# Patient Record
Sex: Male | Born: 1955 | Race: White | Hispanic: No | Smoking: Former smoker
Health system: Southern US, Community
[De-identification: ages and names within clinical notes are randomized; demographics above are authoritative.]

## PROBLEM LIST (undated history)

## (undated) DIAGNOSIS — J984 Other disorders of lung: Secondary | ICD-10-CM

## (undated) DIAGNOSIS — Z5111 Encounter for antineoplastic chemotherapy: Secondary | ICD-10-CM

## (undated) DIAGNOSIS — C3490 Malignant neoplasm of unspecified part of unspecified bronchus or lung: Secondary | ICD-10-CM

## (undated) DIAGNOSIS — J189 Pneumonia, unspecified organism: Secondary | ICD-10-CM

## (undated) DIAGNOSIS — K5900 Constipation, unspecified: Secondary | ICD-10-CM

## (undated) DIAGNOSIS — D649 Anemia, unspecified: Secondary | ICD-10-CM

## (undated) DIAGNOSIS — R918 Other nonspecific abnormal finding of lung field: Principal | ICD-10-CM

## (undated) HISTORY — DX: Pneumonia, unspecified organism: J18.9

## (undated) HISTORY — DX: Other nonspecific abnormal finding of lung field: R91.8

## (undated) HISTORY — PX: APPENDECTOMY: SHX54

## (undated) HISTORY — DX: Other disorders of lung: J98.4

## (undated) HISTORY — DX: Malignant neoplasm of unspecified part of unspecified bronchus or lung: C34.90

## (undated) HISTORY — DX: Constipation, unspecified: K59.00

## (undated) HISTORY — DX: Encounter for antineoplastic chemotherapy: Z51.11

---

## 2016-05-08 ENCOUNTER — Ambulatory Visit (HOSPITAL_COMMUNITY)
Admission: RE | Admit: 2016-05-08 | Discharge: 2016-05-08 | Disposition: A | Discharge status: Home / Self Care | Payer: Medicaid Other | Source: Ambulatory Visit | Attending: Family | Admitting: Family

## 2016-05-08 ENCOUNTER — Other Ambulatory Visit (HOSPITAL_COMMUNITY): Payer: Self-pay | Admitting: Family

## 2016-05-08 DIAGNOSIS — R059 Cough, unspecified: Secondary | ICD-10-CM

## 2016-05-08 DIAGNOSIS — R634 Abnormal weight loss: Secondary | ICD-10-CM

## 2016-05-08 DIAGNOSIS — F17200 Nicotine dependence, unspecified, uncomplicated: Secondary | ICD-10-CM

## 2016-05-08 DIAGNOSIS — J398 Other specified diseases of upper respiratory tract: Secondary | ICD-10-CM | POA: Diagnosis not present

## 2016-05-08 DIAGNOSIS — R05 Cough: Secondary | ICD-10-CM

## 2016-05-09 ENCOUNTER — Other Ambulatory Visit (HOSPITAL_COMMUNITY): Payer: Self-pay | Admitting: Family

## 2016-05-09 DIAGNOSIS — J398 Other specified diseases of upper respiratory tract: Secondary | ICD-10-CM

## 2016-05-14 ENCOUNTER — Ambulatory Visit (HOSPITAL_COMMUNITY)
Admission: RE | Admit: 2016-05-14 | Discharge: 2016-05-14 | Disposition: A | Discharge status: Home / Self Care | Payer: Medicaid Other | Source: Ambulatory Visit | Attending: Family | Admitting: Family

## 2016-05-14 DIAGNOSIS — R918 Other nonspecific abnormal finding of lung field: Secondary | ICD-10-CM | POA: Insufficient documentation

## 2016-05-14 DIAGNOSIS — J439 Emphysema, unspecified: Secondary | ICD-10-CM | POA: Diagnosis not present

## 2016-05-14 DIAGNOSIS — R590 Localized enlarged lymph nodes: Secondary | ICD-10-CM | POA: Diagnosis not present

## 2016-05-14 DIAGNOSIS — J398 Other specified diseases of upper respiratory tract: Secondary | ICD-10-CM | POA: Diagnosis not present

## 2016-05-14 MED ORDER — IOPAMIDOL (ISOVUE-300) INJECTION 61%
75.0000 mL | Freq: Once | INTRAVENOUS | Status: AC | PRN
  Administered 2016-05-14: 75 mL via INTRAVENOUS

## 2016-05-19 ENCOUNTER — Telehealth: Payer: Self-pay | Admitting: Internal Medicine

## 2016-05-19 ENCOUNTER — Encounter: Payer: Self-pay | Admitting: Internal Medicine

## 2016-05-19 ENCOUNTER — Ambulatory Visit (HOSPITAL_BASED_OUTPATIENT_CLINIC_OR_DEPARTMENT_OTHER): Payer: Self-pay | Admitting: Internal Medicine

## 2016-05-19 DIAGNOSIS — Z720 Tobacco use: Secondary | ICD-10-CM

## 2016-05-19 DIAGNOSIS — R918 Other nonspecific abnormal finding of lung field: Secondary | ICD-10-CM

## 2016-05-19 HISTORY — DX: Other nonspecific abnormal finding of lung field: R91.8

## 2016-05-19 NOTE — Telephone Encounter (Signed)
Gave patient avs report and appointments for September and appointments for radonc 8/28. Central radiology scheduling will cal re scans - patient aare

## 2016-05-19 NOTE — Progress Notes (Signed)
In addition to my previous note, advised patient to speak with Edward Spence at next visit regarding transportation grant for lung patients which won't affect his [employer].

## 2016-05-19 NOTE — Progress Notes (Signed)
Met with uninsured patient with financial concerns.Patient does not have income but does receive food stamps.  Patient states he has already completed a [facility] FAA at [facility] and just has to drop off some supporting documents to go with the application. Patient wanted to apply for emergency Medicaid. Gave patient a Medicaid Short Form application to complete and return to DSS in his county which is [facility]. Advised that he may speak with them concerning if he qualifies for Emergency assistance. Gave patient an application to the [employer] which assists cancer patients living in [place]. Advised patient to complete and bring back to myself or Lenise to email and that they would contact patient directly notifying of what type of assistance they may offer him. Approved patient for one-time $400 [place]. Patient has a copy of the approval letter as well as the expense sheet it covers along with the Outpatient Pharmacy information. Will forward letter of support to Raquel for drug replacement. Advised patient he automatically receives a 55%discount for being uninsured until Medicaid or [facility] FAA is approved. Patient has my card as well as Lenise's for any additional financial questions or concerns.

## 2016-05-19 NOTE — Progress Notes (Signed)
[facility] Telephone:([phone]   [phone]  CONSULT NOTE  REFERRING PHYSICIAN: House, [name], FNP  REASON FOR CONSULTATION:  60 years old white male with highly suspicious for lung cancer.  HPI Edward Spence is a 60 y.o. male with long history of smoking and no other medical condition. The patient mentions that in May 2007 he started having had some type of cough that was mostly dry and different from his smoking cough. He did not pay much attention back in July 2017 he started having hoarseness of his voice and weight loss of around 25 pounds over the previous 3 months. He was seen by his primary care nurse practitioner. Chest x-ray on 05/08/2016 showed a prominent right paratracheal mass with shift of the trachea to the left. This was followed by CT scan of the chest with contrast on 05/14/2016 and it showed Superior mediastinal mass identified at RIGHT superior mediastinum 5.4 x 7.3 x 5.9 cm in size, exerting mass effect upon great vessels and displacing trachea from RIGHT to LEFT. Adjacent confluent mediastinal adenopathy including 2.3 cm and 2.8 cm RIGHT paratracheal nodes and a 3.4 cm RIGHT suprahilar node. Additional scattered normal size mediastinal nodes are seen an AP window and prevascular. 11 mm short axis subcarinal node. There was also 1.4 x 1.2 cm spiculated right upper lobe nodule. The large mass at the superior mediastinum questionable arising within the mediastinum or within the medial right upper lobe invading the mediastinum. There was also a tiny 3 mm right upper lobe nodule and 4 mm subpleural nodule in the left lower lobe. The patient was referred to me today for further evaluation and recommendation regarding these abnormalities. When seen today he continues to have the hoarseness of his voice as well as cough productive of whitish sputum with no hemoptysis. He denied having any significant chest pain or shortness of breath. He lost around  25 pounds in the last 3 months. He denied having any significant nausea, vomiting, diarrhea or constipation. He has no fever or chills. He has no headache or visual changes. Family history significant for mother died from metastatic male genital cancer and father had COPD and congestive heart failure. The patient is single and has no children. He was accompanied by his friend Edward Spence. 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 He has a history of smoking 1 pack per day for around 40 years and unfortunately he continues to smoke a few cigarettes every day but trying to quit. Has no history of alcohol or drug abuse.   HPI  Past Medical History:  Diagnosis Date  . Lung mass 05/19/2016    Past Surgical History:  Procedure Laterality Date  . APPENDECTOMY      Family History  Problem Relation Age of Onset  . Cancer Mother   . COPD Father   . Heart failure Father     Social History Social History  Substance Use Topics  . Smoking status: Heavy Tobacco Smoker    Packs/day: 1.00    Years: 40.00    Types: Cigarettes  . Smokeless tobacco: Never Used  . Alcohol use No    Allergies  Allergen Reactions  . Penicillins Hives    Had penicillin it at age 51 and " I got trench mouth and hives "    No current outpatient prescriptions on file.   No current facility-administered medications for this visit.  Review of Systems  Constitutional: positive for weight loss Eyes: negative Ears, nose, mouth, throat, and face: positive for hoarseness Respiratory: positive for cough, dyspnea on exertion and sputum Cardiovascular: negative Gastrointestinal: negative Genitourinary:negative Integument/breast: negative Hematologic/lymphatic: negative Musculoskeletal:negative Neurological: negative Behavioral/Psych: negative Endocrine: negative Allergic/Immunologic: negative  Physical Exam  FMB:[id number], healthy, no distress,  well nourished and well developed SKIN: skin color, texture, turgor are normal, no rashes or significant lesions HEAD: Normocephalic, No masses, lesions, tenderness or abnormalities EYES: normal, PERRLA, Conjunctiva are pink and non-injected EARS: External ears normal, Canals clear OROPHARYNX:no exudate, no erythema and lips, buccal mucosa, and tongue normal  NECK: supple, no adenopathy, no JVD LYMPH:  no palpable lymphadenopathy, no hepatosplenomegaly LUNGS: expiratory wheezes bilaterally HEART: regular rate & rhythm, no murmurs and no gallops ABDOMEN:abdomen soft, non-tender, normal bowel sounds and no masses or organomegaly BACK: Back symmetric, no curvature., No CVA tenderness EXTREMITIES:no joint deformities, effusion, or inflammation, no edema, no skin discoloration  NEURO: alert & oriented x 3 with fluent speech, no focal motor/sensory deficits  PERFORMANCE STATUS: ECOG 1  LABORATORY DATA: No results found for: WBC, HGB, HCT, MCV, PLT    Chemistry   No results found for: NA, K, CL, CO2, BUN, CREATININE, GLU No results found for: CALCIUM, ALKPHOS, AST, ALT, BILITOT     RADIOGRAPHIC STUDIES: Dg Chest 2 View  Result Date: 05/08/2016 CLINICAL DATA:  Cough. EXAM: CHEST  2 VIEW COMPARISON:  No recent. FINDINGS: Right paratracheal mass is noted. This could be of pulmonary, pleural, vascular/ mediastinal origin. Mass results in shift of the trachea to the left. Contrast-enhanced chest CT suggested for further evaluation. Mild bilateral from interstitial prominence, most likely related chronic interstitial lung disease. No focal alveolar infiltrate. No pleural effusion or pneumothorax. Old left posterior eighth rib fracture. IMPRESSION: Prominent right paratracheal mass with shift of the trachea to the left. Contrast-enhanced chest CT suggested for further evaluation. These results will be called to the ordering clinician or representative by the Radiologist Assistant, and communication  documented in the PACS or zVision Dashboard. Electronically Signed   By: Marcello Moores  Register   On: 05/08/2016 13:03   Ct Chest W Contrast  Result Date: 05/14/2016 CLINICAL DATA:  RIGHT paratracheal mass, hoarseness, 25 pound weight loss in 3-4 months EXAM: CT CHEST WITH CONTRAST TECHNIQUE: Multidetector CT imaging of the chest was performed during intravenous contrast administration. Sagittal and coronal MPR images reconstructed from axial data set. CONTRAST:  16m ISOVUE-300 IOPAMIDOL (ISOVUE-300) INJECTION 61% IV COMPARISON:  Chest radiograph 05/08/2016 FINDINGS: Cardiovascular: Minimal coronary arterial calcification. Aorta normal caliber. Compression of SVC by mediastinal mass/ adenopathy. Minimal pericardial effusion. Mediastinum/Nodes: Superior mediastinal mass identified at RIGHT superior mediastinum 5.4 x 7.3 x 5.9 cm in size, exerting mass effect upon great vessels and displacing trachea from RIGHT to LEFT. Adjacent confluent mediastinal adenopathy including 2.3 cm and 2.8 cm RIGHT paratracheal nodes and a 3.4 cm RIGHT suprahilar node. Additional scattered normal size mediastinal nodes are seen an AP window and prevascular. 11 mm short axis subcarinal node image 69. Esophagus unremarkable. Lungs/Pleura: 14 x 12 mm spiculated RIGHT upper lobe nodule image 28. Significant central peribronchial thickening RIGHT upper lobe. Large mass at superior mediastinum question arising within mediastinum or within medial RIGHT upper lobe invading mediastinum. Anterior RIGHT upper lobe infiltrate beyond the mass. Underlying emphysematous changes. Tiny 3 mm RIGHT upper lobe nodule image 44. 3 mm RIGHT lower lobe nodule image 91. 4 mm subpleural nodule LEFT lower lobe image 82. No additional infiltrate, pleural  effusion, or pneumothorax. No endobronchial lesions identified. Upper Abdomen: Visualized upper abdomen unremarkable Musculoskeletal: Old healed fractures LEFT fifth and eighth ribs. No bone destruction. IMPRESSION:  Spiculated RIGHT upper lobe masses 14 x 12 mm question neoplasm. Large medial RIGHT upper lobe/superior mediastinal mass 5.4 x 7.3 x 5.9 cm question RIGHT upper lobe neoplasm invading mediastinum versus mediastinal mass/adenopathy, exerting mass effect upon trachea and great vessels. Additional enlarged confluent RIGHT mediastinal and RIGHT suprahilar lymph nodes. Tissue diagnosis recommended. Underlying emphysematous changes with peribronchial thickening in postobstructive infiltrate versus tumor in the RIGHT upper lobe. Three tiny nonspecific RIGHT lung nodules. These results will be called to the ordering clinician or representative by the Radiologist Assistant, and communication documented in the PACS or zVision Dashboard. Electronically Signed   By: Lavonia Dana M.D.   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   PLAN: I had a lengthy discussion with the patient and his friend today about his current disease status and further investigation to confirm a diagnosis. I recommended for the patient to have a PET scan as well as MRI of the brain to complete the staging workup and to rule out brain metastasis. I also referred the patient to Dr. Roxan Hockey for consideration of bronchoscopy plus/minus mediastinoscopy for tissue diagnosis. The patient is at risk for vascular and airway compromise from the large mediastinal mass. I recommended for him to see radiation oncology for consideration of palliative radiotherapy to this area. I will see the patient back for follow-up visit in less than 2 weeks for reevaluation and more detailed discussion of his treatment  options based on the final staging workup and tissue diagnosis. He was advised to call immediately if he has any concerning symptoms in the interval. The patient voices understanding of current disease status and treatment options and is in agreement with the current care plan.  All questions were answered. The patient knows to call the clinic with any problems, questions or concerns. We can certainly see the patient much sooner if necessary.  Thank you so much for allowing me to participate in the care of Edward Spence. I will continue to follow up the patient with you and assist in his care.  I spent 40 minutes counseling the patient face to face. The total time spent in the appointment was 60 minutes.  Disclaimer: This note was dictated with voice recognition software. Similar sounding words can inadvertently be transcribed and may not be corrected upon review.   Annina Piotrowski K. May 19, 2016, 4:21 PM

## 2016-05-20 ENCOUNTER — Encounter: Payer: Self-pay | Admitting: Internal Medicine

## 2016-05-20 NOTE — Progress Notes (Signed)
Recd income info to file just in case needed for drug replacement. See prv notes. No treatment as of today.

## 2016-05-20 NOTE — Progress Notes (Signed)
No treatment as of today

## 2016-05-21 ENCOUNTER — Other Ambulatory Visit: Payer: Self-pay | Admitting: *Deleted

## 2016-05-21 ENCOUNTER — Encounter: Payer: Self-pay | Admitting: Thoracic Surgery (Cardiothoracic Vascular Surgery)

## 2016-05-21 ENCOUNTER — Institutional Professional Consult (permissible substitution) (INDEPENDENT_AMBULATORY_CARE_PROVIDER_SITE_OTHER): Payer: Medicaid Other | Admitting: Thoracic Surgery (Cardiothoracic Vascular Surgery)

## 2016-05-21 ENCOUNTER — Encounter (HOSPITAL_COMMUNITY): Payer: Self-pay | Admitting: *Deleted

## 2016-05-21 VITALS — BP 117/61 | HR 86 | Resp 20 | Ht 74.0 in | Wt 196.0 lb

## 2016-05-21 DIAGNOSIS — J9859 Other diseases of mediastinum, not elsewhere classified: Secondary | ICD-10-CM

## 2016-05-21 DIAGNOSIS — R918 Other nonspecific abnormal finding of lung field: Secondary | ICD-10-CM

## 2016-05-21 NOTE — OB Triage Note (Signed)
Spoke with pt and pt's ex wife for pre-op call. Pt denies any cardiac history, chest pain or sob.

## 2016-05-21 NOTE — Progress Notes (Addendum)
Thoracic Location of Tumor / Histology: RIGHT Paratracheal mass, Superior mediastinal mass identified and Right superior mediastinum  Displacing trachea from right to left, RUL lung nodule   Patient presented  months ago with symptoms of: hoarseness voice , dry cough and loss 25-30  lbs over 3 months   Biopsies of  (if applicable) revealed: Pending Bronchoscopy for biopsy  with Dr. 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 Rosalene Billings seen patient 05/19/16 scheduling Pet scan and MRI of brain, 05/29/16;    referral for palliative radiation, follow up  06/05/2016  Signs/Symptoms  Weight changes, if any: 25 lb-30 lb s weight loss  Respiratory complaints, if any:  No shortness breath stated, just started coughing up white phlegm this last week   Hemoptysis, if any: NO  Pain issues, if any:  No, just hoarseness  SAFETY ISSUES:  Prior radiation? NO  Pacemaker/ICD? NO  Is the patient on methotrexate? NO  Current Complaints / other details:  Single  4 children,   Mother deceased metastatic genital cancer, Father COPD and CHF  BP 105/78 (BP Location: Left Arm, Patient Position: Sitting, Cuff Size: Normal)   Pulse 91   Temp 97.7 F (36.5 C) (Oral)   Resp 20   Ht '6\' 2"'$  (1.88 m)   Wt 193 lb 11.2 oz (87.9 kg)   SpO2 100% Comment: room air  BMI 24.87 kg/m   Wt Readings from Last 3 Encounters:  05/26/16 193 lb 11.2 oz (87.9 kg)  05/22/16 196 lb (88.9 kg)  05/21/16 196 lb (88.9 kg)   Allergies:PCNS=trench mouth and hives

## 2016-05-21 NOTE — Progress Notes (Signed)
PCP is House, [name], FNP Referring Provider is Curt Bears, MD  Chief Complaint  Patient presents with  . Lung Mass    Surgical eval, needs tissue diagnosis, Chest CT 05/14/16,     HPI: 60 year old man who presented with a chief complaint of hoarseness and was found to have a right lung mass with mediastinal adenopathy.  Edward Spence is a 60 year old man with a 40+-pack-year history of smoking who was in his usual state of health until May when he developed a dry cough. This went on for a couple of months. Then in early July he noted hoarseness. Around this time his cough became productive of mucus. He denies any hemoptysis. He also has lost 14 pounds over the past 3 months. He also noted that he was getting more short of breath with exertion. A chest x-ray showed right hilar and mediastinal fullness. A CT of the chest was done which showed a right hilar mass with mediastinal adenopathy. There was mass effect on the trachea and great vessels.  Edward Spence Score: At the time of surgery this patient's most appropriate activity status/level should be described as: '[]'$     0    Normal activity, no symptoms '[x]'$     1    Restricted in physical strenuous activity but ambulatory, able to do out light work '[]'$     2    Ambulatory and capable of self care, unable to do work activities, up and about >50 % of waking hours                              '[]'$     3    Only limited self care, in bed greater than 50% of waking hours '[]'$     4    Completely disabled, no self care, confined to bed or chair '[]'$     5    Moribund    Past Medical History:  Diagnosis Date  . Lung mass 05/19/2016    Past Surgical History:  Procedure Laterality Date  . APPENDECTOMY      Family History  Problem Relation Age of Onset  . Cancer Mother   . COPD Father   . Heart failure Father     Social History Social History  Substance Use Topics  . Smoking status: Heavy Tobacco Smoker    Packs/day: 1.00    Years: 40.00   Types: Cigarettes  . Smokeless tobacco: Never Used  . Alcohol use No    No current outpatient prescriptions on file.   No current facility-administered medications for this visit.     Allergies  Allergen Reactions  . Penicillins Hives    Had penicillin it at age 10 and " I got trench mouth and hives "    Review of Systems  Constitutional: Positive for activity change, fatigue and unexpected weight change (has lost 14 pounds in 3 months). Negative for chills and fever.  HENT: Positive for voice change. Negative for trouble swallowing.   Eyes: Negative for visual disturbance.  Respiratory: Positive for cough and shortness of breath.   Cardiovascular: Negative for chest pain and leg swelling.  Gastrointestinal: Negative for abdominal pain and blood in stool.  Genitourinary: Negative for difficulty urinating and dysuria.  Musculoskeletal: Negative.   Neurological: Negative for dizziness, syncope and headaches.  Hematological: Negative for adenopathy. Does not bruise/bleed easily.  All other systems reviewed and are negative.   BP 117/61 (BP Location: Left Arm, Patient Position:  Sitting, Cuff Size: Normal)   Pulse 86   Resp 20   Ht '6\' 2"'$  (1.88 m)   Wt 196 lb (88.9 kg)   SpO2 98% Comment: RA  BMI 25.16 kg/m  Physical Exam  Constitutional: He is oriented to person, place, and time. He appears well-developed and well-nourished. No distress.  HENT:  Head: Normocephalic and atraumatic.  Mouth/Throat: No oropharyngeal exudate.  hoarse  Eyes: Conjunctivae and EOM are normal. Pupils are equal, round, and reactive to light. No scleral icterus.  Neck: Neck supple. No thyromegaly present.  Cardiovascular: Normal rate, regular rhythm, normal heart sounds and intact distal pulses.   No murmur heard. Pulmonary/Chest: Effort normal and breath sounds normal. No respiratory distress. He has no wheezes.  Abdominal: Soft. Bowel sounds are normal. He exhibits no distension. There is no  tenderness. There is no rebound.  Musculoskeletal: Normal range of motion. He exhibits no edema or deformity.  Lymphadenopathy:    He has no cervical adenopathy.  Neurological: He is alert and oriented to person, place, and time. He exhibits normal muscle tone.  Skin: Skin is warm and dry.  Psychiatric: He has a normal mood and affect.  Vitals reviewed.    Diagnostic Tests: CT CHEST WITH CONTRAST  TECHNIQUE: Multidetector CT imaging of the chest was performed during intravenous contrast administration. Sagittal and coronal MPR images reconstructed from axial data set.  CONTRAST:  60m ISOVUE-300 IOPAMIDOL (ISOVUE-300) INJECTION 61% IV  COMPARISON:  Chest radiograph 05/08/2016  FINDINGS: Cardiovascular: Minimal coronary arterial calcification. Aorta normal caliber. Compression of SVC by mediastinal mass/ adenopathy. Minimal pericardial effusion.  Mediastinum/Nodes: Superior mediastinal mass identified at RIGHT superior mediastinum 5.4 x 7.3 x 5.9 cm in size, exerting mass effect upon great vessels and displacing trachea from RIGHT to LEFT. Adjacent confluent mediastinal adenopathy including 2.3 cm and 2.8 cm RIGHT paratracheal nodes and a 3.4 cm RIGHT suprahilar node. Additional scattered normal size mediastinal nodes are seen an AP window and prevascular. 11 mm short axis subcarinal node image 69. Esophagus unremarkable.  Lungs/Pleura: 14 x 12 mm spiculated RIGHT upper lobe nodule image 28. Significant central peribronchial thickening RIGHT upper lobe. Large mass at superior mediastinum question arising within mediastinum or within medial RIGHT upper lobe invading mediastinum. Anterior RIGHT upper lobe infiltrate beyond the mass. Underlying emphysematous changes. Tiny 3 mm RIGHT upper lobe nodule image 44. 3 mm RIGHT lower lobe nodule image 91. 4 mm subpleural nodule LEFT lower lobe image 82. No additional infiltrate, pleural effusion, or pneumothorax. No  endobronchial lesions identified.  Upper Abdomen: Visualized upper abdomen unremarkable  Musculoskeletal: Old healed fractures LEFT fifth and eighth ribs. No bone destruction.  IMPRESSION: Spiculated RIGHT upper lobe masses 14 x 12 mm question neoplasm.  Large medial RIGHT upper lobe/superior mediastinal mass 5.4 x 7.3 x 5.9 cm question RIGHT upper lobe neoplasm invading mediastinum versus mediastinal mass/adenopathy, exerting mass effect upon trachea and great vessels.  Additional enlarged confluent RIGHT mediastinal and RIGHT suprahilar lymph nodes.  Tissue diagnosis recommended.  Underlying emphysematous changes with peribronchial thickening in postobstructive infiltrate versus tumor in the RIGHT upper lobe.  Three tiny nonspecific RIGHT lung nodules.  These results will be called to the ordering clinician or representative by the Radiologist Assistant, and communication documented in the PACS or zVision Dashboard.   Electronically Signed   By: MLavonia DanaM.D.   On: 05/14/2016 16:19  Personally reviewed the CT images and concur with findings as noted above.  Impression: 60year old man with a history of  tobacco abuse who presented with cough and hoarseness progressing to shortness of breath with exertion who has been found to have a large right hilar mass with mediastinal adenopathy. This is highly suspicious for primary bronchogenic carcinoma. Differential includes both small cell and non-small cell carcinoma. Small cell is most likely.  He needs a tissue diagnosis to guide therapy.  I advised him to undergo bronchoscopy, endobronchial ultrasound, and possible mediastinoscopy for diagnostic purposes. I informed him of the general nature of the procedure including the need for general anesthesia and possible need for an incision. This will be done as an outpatient. I informed him of the indications, risks, benefits, and alternatives. He understands risks  include, but are not limited to those associated with general anesthesia including death, MI, DVT, PE, stroke, bleeding, wound infection, pneumothorax, failure to make a diagnosis, as well as possibility of other unforeseeable complications.  He accepts the risks and wishes to proceed.  Plan: Bronchoscopy, endobronchial ultrasound, possible mediastinal endoscopy on Thursday, 05/22/2016.  [name], MD [facility] [phone]

## 2016-05-22 ENCOUNTER — Encounter (HOSPITAL_COMMUNITY)
Admission: RE | Disposition: A | Discharge status: Home / Self Care | Payer: Self-pay | Source: Ambulatory Visit | Attending: Thoracic Surgery (Cardiothoracic Vascular Surgery)

## 2016-05-22 ENCOUNTER — Ambulatory Visit (HOSPITAL_COMMUNITY): Payer: Medicaid Other

## 2016-05-22 ENCOUNTER — Ambulatory Visit (HOSPITAL_COMMUNITY): Payer: Medicaid Other | Admitting: Anesthesiology

## 2016-05-22 ENCOUNTER — Ambulatory Visit (HOSPITAL_COMMUNITY)
Admission: RE | Admit: 2016-05-22 | Discharge: 2016-05-22 | Disposition: A | Discharge status: Home / Self Care | Payer: Medicaid Other | Source: Ambulatory Visit | Attending: Thoracic Surgery (Cardiothoracic Vascular Surgery) | Admitting: Thoracic Surgery (Cardiothoracic Vascular Surgery)

## 2016-05-22 ENCOUNTER — Encounter (HOSPITAL_COMMUNITY): Payer: Self-pay | Admitting: Certified Registered Nurse Anesthetist

## 2016-05-22 DIAGNOSIS — R918 Other nonspecific abnormal finding of lung field: Secondary | ICD-10-CM

## 2016-05-22 DIAGNOSIS — F17210 Nicotine dependence, cigarettes, uncomplicated: Secondary | ICD-10-CM | POA: Insufficient documentation

## 2016-05-22 DIAGNOSIS — R590 Localized enlarged lymph nodes: Secondary | ICD-10-CM | POA: Insufficient documentation

## 2016-05-22 DIAGNOSIS — C3401 Malignant neoplasm of right main bronchus: Secondary | ICD-10-CM | POA: Diagnosis not present

## 2016-05-22 DIAGNOSIS — J9859 Other diseases of mediastinum, not elsewhere classified: Secondary | ICD-10-CM

## 2016-05-22 HISTORY — DX: Pneumonia, unspecified organism: J18.9

## 2016-05-22 HISTORY — PX: VIDEO BRONCHOSCOPY WITH ENDOBRONCHIAL ULTRASOUND: SHX6177

## 2016-05-22 LAB — COMPREHENSIVE METABOLIC PANEL
ALBUMIN: 3.6 g/dL (ref 3.5–5.0)
ALT: 32 U/L (ref 17–63)
AST: 48 U/L — AB (ref 15–41)
Alkaline Phosphatase: 144 U/L — ABNORMAL HIGH (ref 38–126)
Anion gap: 7 (ref 5–15)
BILIRUBIN TOTAL: 1.1 mg/dL (ref 0.3–1.2)
BUN: 5 mg/dL — AB (ref 6–20)
CO2: 23 mmol/L (ref 22–32)
CREATININE: 0.77 mg/dL (ref 0.61–1.24)
Calcium: 9.2 mg/dL (ref 8.9–10.3)
Chloride: 107 mmol/L (ref 101–111)
GFR calc Af Amer: >60 mL/min (ref >60)
GLUCOSE: 96 mg/dL (ref 65–99)
POTASSIUM: 4.4 mmol/L (ref 3.5–5.1)
Sodium: 137 mmol/L (ref 135–145)
TOTAL PROTEIN: 7 g/dL (ref 6.5–8.1)

## 2016-05-22 LAB — APTT: APTT: 33 s (ref 24–36)

## 2016-05-22 LAB — PROTIME-INR
INR: 1.14
PROTHROMBIN TIME: 14.6 s (ref 11.4–15.2)

## 2016-05-22 LAB — CBC
HEMATOCRIT: 43.5 % (ref 39.0–52.0)
HEMOGLOBIN: 13.9 g/dL (ref 13.0–17.0)
MCH: 27.3 pg (ref 26.0–34.0)
MCHC: 32 g/dL (ref 30.0–36.0)
MCV: 85.3 fL (ref 78.0–100.0)
Platelets: 186 10*3/uL (ref 150–400)
RBC: 5.1 MIL/uL (ref 4.22–5.81)
RDW: 15.8 % — ABNORMAL HIGH (ref 11.5–15.5)
WBC: 4.7 10*3/uL (ref 4.0–10.5)

## 2016-05-22 LAB — ABO/RH: ABO/RH(D): A POS

## 2016-05-22 LAB — TYPE AND SCREEN
ABO/RH(D): A POS
ANTIBODY SCREEN: NEGATIVE

## 2016-05-22 LAB — SURGICAL PCR SCREEN
MRSA, PCR: NEGATIVE
STAPHYLOCOCCUS AUREUS: NEGATIVE

## 2016-05-22 SURGERY — BRONCHOSCOPY, WITH EBUS
Anesthesia: General

## 2016-05-22 MED ORDER — DEXAMETHASONE SODIUM PHOSPHATE 10 MG/ML IJ SOLN
INTRAMUSCULAR | Status: AC
  Filled 2016-05-22: qty 1

## 2016-05-22 MED ORDER — MIDAZOLAM HCL 5 MG/5ML IJ SOLN
INTRAMUSCULAR | Status: DC | PRN
  Administered 2016-05-22: 2 mg via INTRAVENOUS

## 2016-05-22 MED ORDER — ONDANSETRON HCL 4 MG/2ML IJ SOLN: 4.0000 mg | Freq: Four times a day (QID) | INTRAMUSCULAR | Status: DC | PRN

## 2016-05-22 MED ORDER — SUGAMMADEX SODIUM 200 MG/2ML IV SOLN
INTRAVENOUS | Status: DC | PRN
  Administered 2016-05-22: 150 mg via INTRAVENOUS

## 2016-05-22 MED ORDER — PROPOFOL 10 MG/ML IV BOLUS
INTRAVENOUS | Status: DC | PRN
  Administered 2016-05-22: 150 mg via INTRAVENOUS

## 2016-05-22 MED ORDER — FENTANYL CITRATE (PF) 100 MCG/2ML IJ SOLN
INTRAMUSCULAR | Status: DC | PRN
  Administered 2016-05-22: 100 ug via INTRAVENOUS

## 2016-05-22 MED ORDER — HYDROMORPHONE HCL 1 MG/ML IJ SOLN: 0.2500 mg | INTRAMUSCULAR | Status: DC | PRN

## 2016-05-22 MED ORDER — ROCURONIUM BROMIDE 10 MG/ML (PF) SYRINGE
PREFILLED_SYRINGE | INTRAVENOUS | Status: DC | PRN
  Administered 2016-05-22: 20 mg via INTRAVENOUS
  Administered 2016-05-22: 40 mg via INTRAVENOUS

## 2016-05-22 MED ORDER — MUPIROCIN 2 % EX OINT
TOPICAL_OINTMENT | Freq: Once | CUTANEOUS | Status: AC
  Administered 2016-05-22: 07:00:00 via NASAL

## 2016-05-22 MED ORDER — FENTANYL CITRATE (PF) 100 MCG/2ML IJ SOLN
INTRAMUSCULAR | Status: AC
  Filled 2016-05-22: qty 2

## 2016-05-22 MED ORDER — OXYCODONE HCL 5 MG PO TABS: 5.0000 mg | ORAL_TABLET | Freq: Once | ORAL | Status: DC | PRN

## 2016-05-22 MED ORDER — MUPIROCIN 2 % EX OINT
TOPICAL_OINTMENT | CUTANEOUS | Status: AC
  Filled 2016-05-22: qty 22

## 2016-05-22 MED ORDER — SUGAMMADEX SODIUM 200 MG/2ML IV SOLN
INTRAVENOUS | Status: AC
  Filled 2016-05-22: qty 2

## 2016-05-22 MED ORDER — LIDOCAINE 2% (20 MG/ML) 5 ML SYRINGE
INTRAMUSCULAR | Status: DC | PRN
  Administered 2016-05-22: 80 mg via INTRAVENOUS

## 2016-05-22 MED ORDER — EPINEPHRINE HCL 1 MG/ML IJ SOLN
INTRAMUSCULAR | Status: AC
  Filled 2016-05-22: qty 1

## 2016-05-22 MED ORDER — ONDANSETRON HCL 4 MG/2ML IJ SOLN
INTRAMUSCULAR | Status: AC
  Filled 2016-05-22: qty 2

## 2016-05-22 MED ORDER — PROPOFOL 10 MG/ML IV BOLUS
INTRAVENOUS | Status: AC
  Filled 2016-05-22: qty 20

## 2016-05-22 MED ORDER — VANCOMYCIN HCL IN DEXTROSE 1-5 GM/200ML-% IV SOLN
1000.0000 mg | INTRAVENOUS | Status: AC
  Administered 2016-05-22: 1000 mg via INTRAVENOUS

## 2016-05-22 MED ORDER — PROPOFOL 10 MG/ML IV BOLUS
INTRAVENOUS | Status: AC
  Filled 2016-05-22: qty 40

## 2016-05-22 MED ORDER — LIDOCAINE 2% (20 MG/ML) 5 ML SYRINGE
INTRAMUSCULAR | Status: AC
  Filled 2016-05-22: qty 5

## 2016-05-22 MED ORDER — VANCOMYCIN HCL IN DEXTROSE 1-5 GM/200ML-% IV SOLN
INTRAVENOUS | Status: AC
  Filled 2016-05-22: qty 200

## 2016-05-22 MED ORDER — OXYCODONE HCL 5 MG/5ML PO SOLN: 5.0000 mg | Freq: Once | ORAL | Status: DC | PRN

## 2016-05-22 MED ORDER — LACTATED RINGERS IV SOLN
INTRAVENOUS | Status: DC | PRN
  Administered 2016-05-22: 07:00:00 via INTRAVENOUS

## 2016-05-22 MED ORDER — 0.9 % SODIUM CHLORIDE (POUR BTL) OPTIME
TOPICAL | Status: DC | PRN
  Administered 2016-05-22: 1000 mL

## 2016-05-22 MED ORDER — ROCURONIUM BROMIDE 10 MG/ML (PF) SYRINGE
PREFILLED_SYRINGE | INTRAVENOUS | Status: AC
  Filled 2016-05-22: qty 10

## 2016-05-22 MED ORDER — ONDANSETRON HCL 4 MG/2ML IJ SOLN
INTRAMUSCULAR | Status: DC | PRN
  Administered 2016-05-22: 4 mg via INTRAVENOUS

## 2016-05-22 MED ORDER — MIDAZOLAM HCL 2 MG/2ML IJ SOLN
INTRAMUSCULAR | Status: AC
  Filled 2016-05-22: qty 2

## 2016-05-22 MED ORDER — DEXAMETHASONE SODIUM PHOSPHATE 10 MG/ML IJ SOLN
INTRAMUSCULAR | Status: DC | PRN
  Administered 2016-05-22: 4 mg via INTRAVENOUS

## 2016-05-22 SURGICAL SUPPLY — 62 items
APPLIER CLIP LOGIC TI 5 (MISCELLANEOUS) IMPLANT
BLADE SURG 15 STRL LF DISP TIS (BLADE) IMPLANT
BLADE SURG 15 STRL SS (BLADE)
BRUSH CYTOL CELLEBRITY 1.5X140 (MISCELLANEOUS) ×3 IMPLANT
CANISTER SUCTION 2500CC (MISCELLANEOUS) ×3 IMPLANT
CLIP TI MEDIUM 6 (CLIP) IMPLANT
CONT SPEC 4OZ CLIKSEAL STRL BL (MISCELLANEOUS) ×6 IMPLANT
COTTONBALL LRG STERILE PKG (GAUZE/BANDAGES/DRESSINGS) IMPLANT
COVER DOME SNAP 22 D (MISCELLANEOUS) ×3 IMPLANT
COVER SURGICAL LIGHT HANDLE (MISCELLANEOUS) ×3 IMPLANT
COVER TABLE BACK 60X90 (DRAPES) ×3 IMPLANT
DERMABOND ADVANCED (GAUZE/BANDAGES/DRESSINGS)
DERMABOND ADVANCED .7 DNX12 (GAUZE/BANDAGES/DRESSINGS) IMPLANT
DRAPE CHEST BREAST 15X10 FENES (DRAPES) IMPLANT
ELECT REM PT RETURN 9FT ADLT (ELECTROSURGICAL) ×3
ELECTRODE REM PT RTRN 9FT ADLT (ELECTROSURGICAL) ×1 IMPLANT
FILTER STRAW FLUID ASPIR (MISCELLANEOUS) IMPLANT
FORCEPS BIOP RJ4 1.8 (CUTTING FORCEPS) IMPLANT
GAUZE SPONGE 4X4 12PLY STRL (GAUZE/BANDAGES/DRESSINGS) ×3 IMPLANT
GAUZE SPONGE 4X4 16PLY XRAY LF (GAUZE/BANDAGES/DRESSINGS) IMPLANT
GLOVE SURG SIGNA 7.5 PF LTX (GLOVE) ×3 IMPLANT
GOWN STRL REUS W/ TWL LRG LVL3 (GOWN DISPOSABLE) ×1 IMPLANT
GOWN STRL REUS W/ TWL XL LVL3 (GOWN DISPOSABLE) ×1 IMPLANT
GOWN STRL REUS W/TWL LRG LVL3 (GOWN DISPOSABLE) ×2
GOWN STRL REUS W/TWL XL LVL3 (GOWN DISPOSABLE) ×2
HEMOSTAT SURGICEL 2X14 (HEMOSTASIS) IMPLANT
KIT BASIN OR (CUSTOM PROCEDURE TRAY) ×3 IMPLANT
KIT CLEAN ENDO COMPLIANCE (KITS) ×9 IMPLANT
KIT ROOM TURNOVER OR (KITS) ×3 IMPLANT
MARKER SKIN DUAL TIP RULER LAB (MISCELLANEOUS) ×3 IMPLANT
NEEDLE 22X1 1/2 (OR ONLY) (NEEDLE) IMPLANT
NEEDLE BIOPSY TRANSBRONCH 21G (NEEDLE) IMPLANT
NEEDLE BLUNT 18X1 FOR OR ONLY (NEEDLE) IMPLANT
NEEDLE EBUS SONO TIP PENTAX (NEEDLE) ×3 IMPLANT
NS IRRIG 1000ML POUR BTL (IV SOLUTION) ×3 IMPLANT
OIL SILICONE PENTAX (PARTS (SERVICE/REPAIRS)) ×3 IMPLANT
PACK SURGICAL SETUP 50X90 (CUSTOM PROCEDURE TRAY) IMPLANT
PAD ARMBOARD 7.5X6 YLW CONV (MISCELLANEOUS) ×6 IMPLANT
PENCIL BUTTON HOLSTER BLD 10FT (ELECTRODE) IMPLANT
SPONGE INTESTINAL PEANUT (DISPOSABLE) IMPLANT
SUT SILK 2 0 (SUTURE)
SUT SILK 2-0 18XBRD TIE 12 (SUTURE) IMPLANT
SUT VIC AB 2-0 CT1 27 (SUTURE)
SUT VIC AB 2-0 CT1 TAPERPNT 27 (SUTURE) IMPLANT
SUT VIC AB 3-0 SH 18 (SUTURE) IMPLANT
SUT VICRYL 4-0 PS2 18IN ABS (SUTURE) IMPLANT
SWAB COLLECTION DEVICE MRSA (MISCELLANEOUS) IMPLANT
SYR 20CC LL (SYRINGE) ×3 IMPLANT
SYR 20ML ECCENTRIC (SYRINGE) ×3 IMPLANT
SYR 5ML LL (SYRINGE) ×3 IMPLANT
SYR 5ML LUER SLIP (SYRINGE) ×3 IMPLANT
SYR CONTROL 10ML LL (SYRINGE) IMPLANT
SYRINGE 10CC LL (SYRINGE) IMPLANT
TOWEL OR 17X24 6PK STRL BLUE (TOWEL DISPOSABLE) ×3 IMPLANT
TOWEL OR 17X26 10 PK STRL BLUE (TOWEL DISPOSABLE) IMPLANT
TRAP SPECIMEN MUCOUS 40CC (MISCELLANEOUS) ×3 IMPLANT
TUBE ANAEROBIC SPECIMEN COL (MISCELLANEOUS) IMPLANT
TUBE CONNECTING 12'X1/4 (SUCTIONS)
TUBE CONNECTING 12X1/4 (SUCTIONS) IMPLANT
TUBE CONNECTING 20'X1/4 (TUBING) ×1
TUBE CONNECTING 20X1/4 (TUBING) ×2 IMPLANT
WATER STERILE IRR 1000ML POUR (IV SOLUTION) ×3 IMPLANT

## 2016-05-22 NOTE — Op Note (Signed)
NAME:  JHALIL, SILVERA NO.:  [id number]  MEDICAL RECORD NO.:  [id number]  LOCATION:  [facility]                         FACILITY:  [facility]  PHYSICIAN:  Revonda Standard. Roxan Hockey, M.D.DATE OF BIRTH:  [birth year]  DATE OF PROCEDURE:  05/22/2016 DATE OF DISCHARGE:  05/22/2016                              OPERATIVE REPORT   PREOPERATIVE DIAGNOSIS:  Right hilar mass with mediastinal adenopathy.  POSTOPERATIVE DIAGNOSIS:  Non-small cell carcinoma, clinical stage IIIB.  PROCEDURE:   Videobronchoscopy with brushings Endobronchial ultrasound with mediastinal lymph node aspirations.  SURGEON:  Revonda Standard. Roxan Hockey, M.D.  ASSISTANT:  None.  ANESTHESIA:  General.  FINDINGS:  Large right paratracheal mass.  Initial aspiration showed non- small cell carcinoma.  CLINICAL NOTE:  Mr. Leece is a 60 year old man with a history of tobacco abuse who recently developed cough, shortness of breath and weight loss.  Workup revealed a large right hilar mass with mediastinal adenopathy.  He was referred for surgical consultation for biopsy.  The indications, risks, benefits and alternatives were discussed in detail with the patient.  He understood and accepted the risks and agreed to proceed.  OPERATIVE NOTE:  Mr. Holan was brought to the operating room on May 22, 2016.  He had induction of general anesthesia and was intubated.  After performing a time-out, flexible fiberoptic bronchoscopy was performed via the endotracheal tube.  It revealed normal endobronchial anatomy with no endobronchial lesions seen to the level of the subsegmental bronchi.  The bronchoscope was removed.  The endobronchial ultrasound probe was advanced.  There was a large right paratracheal mass, it was unclear if this was the primary mass or paratracheal adenopathy.  Multiple aspirations were performed including some with and some without suction applied.  With each aspiration, part of the specimen was  placed onto the slides and the remainder was placed into the cytologic preparation fluid for cell block.  Four aspirations were performed and these specimens were sent to Pathology.  Two additional aspirations were performed and added to the cell block specimen.  There was some bleeding within the airway with aspirations. The endobronchial ultrasound probe was removed.  The bronchoscope was reinserted.  The bloody aspirations were cleared.  There was blood noted coming from the apical segmental bronchus.  Brushings were performed in that area.  All the additional bleeding cleared with saline irrigation. The initial slide came back showing non-small cell carcinoma.  The endobronchial ultrasound probe was reinserted and three additional aspirations were performed with the entire specimen being placed into the cytologic fluid for cell block.  A final inspection was made, there was no ongoing bleeding.  The scope was removed.  The patient was extubated in the operating room and taken to the postanesthetic care unit in good condition.     Revonda Standard Roxan Hockey, M.D.     SCH/MEDQ  D:  05/22/2016  T:  05/22/2016  Job:  [id number]

## 2016-05-22 NOTE — Anesthesia Procedure Notes (Addendum)
Procedure Name: Intubation Date/Time: 05/22/2016 8:04 AM Performed by: Merdis Delay Pre-anesthesia Checklist: Patient identified, Suction available, Patient being monitored, Timeout performed and Emergency Drugs available Patient Re-evaluated:Patient Re-evaluated prior to inductionOxygen Delivery Method: Circle system utilized Preoxygenation: Pre-oxygenation with 100% oxygen Intubation Type: IV induction Ventilation: Mask ventilation without difficulty Laryngoscope Size: Mac and 4 Grade View: Grade I Tube type: Oral Tube size: 9.0 mm Number of attempts: 1 Placement Confirmation: ETT inserted through vocal cords under direct vision,  positive ETCO2,  CO2 detector and breath sounds checked- equal and bilateral Secured at: 22 cm Tube secured with: Tape Dental Injury: Teeth and Oropharynx as per pre-operative assessment  Comments: Preformed by daniel huff srna

## 2016-05-22 NOTE — Transfer of Care (Signed)
Immediate Anesthesia Transfer of Care Note  Patient: Edward Spence  Procedure(s) Performed: Procedure(s): VIDEO BRONCHOSCOPY WITH ENDOBRONCHIAL ULTRASOUND (N/A)  Patient Location: PACU  Anesthesia Type:General  Level of Consciousness: awake, alert  and oriented  Airway & Oxygen Therapy: Patient Spontanous Breathing and Patient connected to nasal cannula oxygen  Post-op Assessment: Report given to RN and Post -op Vital signs reviewed and stable  Post vital signs: Reviewed and stable  Last Vitals:  Vitals:   05/22/16 0618  BP: 124/61  Pulse: 79  Resp: 20  Temp: 36.8 C    Last Pain:  Vitals:   05/22/16 0618  TempSrc: Oral         Complications: No apparent anesthesia complications

## 2016-05-22 NOTE — H&P (View-Only) (Signed)
PCP is House, [name], FNP Referring Provider is Curt Bears, MD  Chief Complaint  Patient presents with  . Lung Mass    Surgical eval, needs tissue diagnosis, Chest CT 05/14/16,     HPI: 60 year old man who presented with a chief complaint of hoarseness and was found to have a right lung mass with mediastinal adenopathy.  Edward Spence is a 60 year old man with a 40+-pack-year history of smoking who was in his usual state of health until May when he developed a dry cough. This went on for a couple of months. Then in early July he noted hoarseness. Around this time his cough became productive of mucus. He denies any hemoptysis. He also has lost 14 pounds over the past 3 months. He also noted that he was getting more short of breath with exertion. A chest x-ray showed right hilar and mediastinal fullness. A CT of the chest was done which showed a right hilar mass with mediastinal adenopathy. There was mass effect on the trachea and great vessels.  Zubrod Score: At the time of surgery this patient's most appropriate activity status/level should be described as: '[]'$     0    Normal activity, no symptoms '[x]'$     1    Restricted in physical strenuous activity but ambulatory, able to do out light work '[]'$     2    Ambulatory and capable of self care, unable to do work activities, up and about >50 % of waking hours                              '[]'$     3    Only limited self care, in bed greater than 50% of waking hours '[]'$     4    Completely disabled, no self care, confined to bed or chair '[]'$     5    Moribund    Past Medical History:  Diagnosis Date  . Lung mass 05/19/2016    Past Surgical History:  Procedure Laterality Date  . APPENDECTOMY      Family History  Problem Relation Age of Onset  . Cancer Mother   . COPD Father   . Heart failure Father     Social History Social History  Substance Use Topics  . Smoking status: Heavy Tobacco Smoker    Packs/day: 1.00    Years: 40.00   Types: Cigarettes  . Smokeless tobacco: Never Used  . Alcohol use No    No current outpatient prescriptions on file.   No current facility-administered medications for this visit.     Allergies  Allergen Reactions  . Penicillins Hives    Had penicillin it at age 49 and " I got trench mouth and hives "    Review of Systems  Constitutional: Positive for activity change, fatigue and unexpected weight change (has lost 14 pounds in 3 months). Negative for chills and fever.  HENT: Positive for voice change. Negative for trouble swallowing.   Eyes: Negative for visual disturbance.  Respiratory: Positive for cough and shortness of breath.   Cardiovascular: Negative for chest pain and leg swelling.  Gastrointestinal: Negative for abdominal pain and blood in stool.  Genitourinary: Negative for difficulty urinating and dysuria.  Musculoskeletal: Negative.   Neurological: Negative for dizziness, syncope and headaches.  Hematological: Negative for adenopathy. Does not bruise/bleed easily.  All other systems reviewed and are negative.   BP 117/61 (BP Location: Left Arm, Patient Position:  Sitting, Cuff Size: Normal)   Pulse 86   Resp 20   Ht '6\' 2"'$  (1.88 m)   Wt 196 lb (88.9 kg)   SpO2 98% Comment: RA  BMI 25.16 kg/m  Physical Exam  Constitutional: He is oriented to person, place, and time. He appears well-developed and well-nourished. No distress.  HENT:  Head: Normocephalic and atraumatic.  Mouth/Throat: No oropharyngeal exudate.  hoarse  Eyes: Conjunctivae and EOM are normal. Pupils are equal, round, and reactive to light. No scleral icterus.  Neck: Neck supple. No thyromegaly present.  Cardiovascular: Normal rate, regular rhythm, normal heart sounds and intact distal pulses.   No murmur heard. Pulmonary/Chest: Effort normal and breath sounds normal. No respiratory distress. He has no wheezes.  Abdominal: Soft. Bowel sounds are normal. He exhibits no distension. There is no  tenderness. There is no rebound.  Musculoskeletal: Normal range of motion. He exhibits no edema or deformity.  Lymphadenopathy:    He has no cervical adenopathy.  Neurological: He is alert and oriented to person, place, and time. He exhibits normal muscle tone.  Skin: Skin is warm and dry.  Psychiatric: He has a normal mood and affect.  Vitals reviewed.    Diagnostic Tests: CT CHEST WITH CONTRAST  TECHNIQUE: Multidetector CT imaging of the chest was performed during intravenous contrast administration. Sagittal and coronal MPR images reconstructed from axial data set.  CONTRAST:  45m ISOVUE-300 IOPAMIDOL (ISOVUE-300) INJECTION 61% IV  COMPARISON:  Chest radiograph 05/08/2016  FINDINGS: Cardiovascular: Minimal coronary arterial calcification. Aorta normal caliber. Compression of SVC by mediastinal mass/ adenopathy. Minimal pericardial effusion.  Mediastinum/Nodes: Superior mediastinal mass identified at RIGHT superior mediastinum 5.4 x 7.3 x 5.9 cm in size, exerting mass effect upon great vessels and displacing trachea from RIGHT to LEFT. Adjacent confluent mediastinal adenopathy including 2.3 cm and 2.8 cm RIGHT paratracheal nodes and a 3.4 cm RIGHT suprahilar node. Additional scattered normal size mediastinal nodes are seen an AP window and prevascular. 11 mm short axis subcarinal node image 69. Esophagus unremarkable.  Lungs/Pleura: 14 x 12 mm spiculated RIGHT upper lobe nodule image 28. Significant central peribronchial thickening RIGHT upper lobe. Large mass at superior mediastinum question arising within mediastinum or within medial RIGHT upper lobe invading mediastinum. Anterior RIGHT upper lobe infiltrate beyond the mass. Underlying emphysematous changes. Tiny 3 mm RIGHT upper lobe nodule image 44. 3 mm RIGHT lower lobe nodule image 91. 4 mm subpleural nodule LEFT lower lobe image 82. No additional infiltrate, pleural effusion, or pneumothorax. No  endobronchial lesions identified.  Upper Abdomen: Visualized upper abdomen unremarkable  Musculoskeletal: Old healed fractures LEFT fifth and eighth ribs. No bone destruction.  IMPRESSION: Spiculated RIGHT upper lobe masses 14 x 12 mm question neoplasm.  Large medial RIGHT upper lobe/superior mediastinal mass 5.4 x 7.3 x 5.9 cm question RIGHT upper lobe neoplasm invading mediastinum versus mediastinal mass/adenopathy, exerting mass effect upon trachea and great vessels.  Additional enlarged confluent RIGHT mediastinal and RIGHT suprahilar lymph nodes.  Tissue diagnosis recommended.  Underlying emphysematous changes with peribronchial thickening in postobstructive infiltrate versus tumor in the RIGHT upper lobe.  Three tiny nonspecific RIGHT lung nodules.  These results will be called to the ordering clinician or representative by the Radiologist Assistant, and communication documented in the PACS or zVision Dashboard.   Electronically Signed   By: MLavonia DanaM.D.   On: 05/14/2016 16:19  Personally reviewed the CT images and concur with findings as noted above.  Impression: 60year old man with a history of  tobacco abuse who presented with cough and hoarseness progressing to shortness of breath with exertion who has been found to have a large right hilar mass with mediastinal adenopathy. This is highly suspicious for primary bronchogenic carcinoma. Differential includes both small cell and non-small cell carcinoma. Small cell is most likely.  He needs a tissue diagnosis to guide therapy.  I advised him to undergo bronchoscopy, endobronchial ultrasound, and possible mediastinoscopy for diagnostic purposes. I informed him of the general nature of the procedure including the need for general anesthesia and possible need for an incision. This will be done as an outpatient. I informed him of the indications, risks, benefits, and alternatives. He understands risks  include, but are not limited to those associated with general anesthesia including death, MI, DVT, PE, stroke, bleeding, wound infection, pneumothorax, failure to make a diagnosis, as well as possibility of other unforeseeable complications.  He accepts the risks and wishes to proceed.  Plan: Bronchoscopy, endobronchial ultrasound, possible mediastinal endoscopy on Thursday, 05/22/2016.  [name], MD [facility] [phone]

## 2016-05-22 NOTE — Brief Op Note (Signed)
05/22/2016  9:36 AM  PATIENT:  Edward Spence  60 y.o. male  PRE-OPERATIVE DIAGNOSIS:  large right hilar mass with mediastinal adenopathy  POST-OPERATIVE DIAGNOSIS:  Non-small cell carcinoma  PROCEDURE:  Procedure(s): VIDEO BRONCHOSCOPY WITH ENDOBRONCHIAL ULTRASOUND (N/A)  SURGEON:  Surgeon(s) and Role:    * [name], MD - Primary  ANESTHESIA:   general  EBL:  Total I/O In: 750 [I.V.:750] Out: 2 [Blood:2]  BLOOD ADMINISTERED:none  DRAINS: none   LOCAL MEDICATIONS USED:  NONE  SPECIMEN:  Source of Specimen:  Right paratracheal mass  DISPOSITION OF SPECIMEN:  PATHOLOGY  PLAN OF CARE: Discharge to home after PACU  PATIENT DISPOSITION:  PACU - hemodynamically stable.   Delay start of Pharmacological VTE agent (>24hrs) due to surgical blood loss or risk of bleeding: not applicable

## 2016-05-22 NOTE — Anesthesia Preprocedure Evaluation (Addendum)
Anesthesia Evaluation  Patient identified by MRN, date of birth, ID band Patient awake    Reviewed: Allergy & Precautions, NPO status , Patient's Chart, lab work & pertinent test results  Airway Mallampati: II   Neck ROM: full    Dental  (+) Edentulous Upper, Edentulous Lower, Dental Advisory Given   Pulmonary Current Smoker,  Lung mass   breath sounds clear to auscultation       Cardiovascular negative cardio ROS   Rhythm:regular Rate:Normal     Neuro/Psych    GI/Hepatic   Endo/Other    Renal/GU      Musculoskeletal   Abdominal   Peds  Hematology   Anesthesia Other Findings   Reproductive/Obstetrics                            Anesthesia Physical Anesthesia Plan  ASA: II  Anesthesia Plan: General   Post-op Pain Management:    Induction: Intravenous  Airway Management Planned: Oral ETT  Additional Equipment:   Intra-op Plan:   Post-operative Plan: Extubation in OR  Informed Consent: I have reviewed the patients History and Physical, chart, labs and discussed the procedure including the risks, benefits and alternatives for the proposed anesthesia with the patient or authorized representative who has indicated his/her understanding and acceptance.     Plan Discussed with: CRNA, Anesthesiologist and Surgeon  Anesthesia Plan Comments:         Anesthesia Quick Evaluation

## 2016-05-22 NOTE — Interval H&P Note (Signed)
History and Physical Interval Note:  05/22/2016 7:47 AM  Edward Spence  has presented today for surgery, with the diagnosis of large right hilar mass with mediastinal adenopathy  The various methods of treatment have been discussed with the patient and family. After consideration of risks, benefits and other options for treatment, the patient has consented to  Procedure(s): VIDEO BRONCHOSCOPY WITH ENDOBRONCHIAL ULTRASOUND (N/A) POSSIBLE MEDIASTINOSCOPY (N/A) as a surgical intervention .  The patient's history has been reviewed, patient examined, no change in status, stable for surgery.  I have reviewed the patient's chart and labs.  Questions were answered to the patient's satisfaction.     [name]

## 2016-05-22 NOTE — Discharge Instructions (Addendum)
Do not drive or engage in heavy physical activity for 24 hours  You may resume normal activities tomorrow  You may cough up small amounts of blood over the next few days  You may use over the counter pain relievers or cough medication as needed  Call [phone] if you develop chest pain, shortness of breath, fever > 101 F or cough up > 2 tablespoons of blood  Follow up with Dr. Julien Nordmann as scheduled

## 2016-05-23 ENCOUNTER — Encounter (HOSPITAL_COMMUNITY): Payer: Self-pay | Admitting: Thoracic Surgery (Cardiothoracic Vascular Surgery)

## 2016-05-26 ENCOUNTER — Ambulatory Visit
Admission: RE | Admit: 2016-05-26 | Discharge: 2016-05-26 | Disposition: A | Discharge status: Home / Self Care | Payer: Medicaid Other | Source: Ambulatory Visit | Attending: Radiation Oncology | Admitting: Radiation Oncology

## 2016-05-26 ENCOUNTER — Encounter: Payer: Self-pay | Admitting: Radiation Oncology

## 2016-05-26 DIAGNOSIS — C3411 Malignant neoplasm of upper lobe, right bronchus or lung: Secondary | ICD-10-CM | POA: Insufficient documentation

## 2016-05-26 DIAGNOSIS — Z510 Encounter for antineoplastic radiation therapy: Secondary | ICD-10-CM | POA: Diagnosis present

## 2016-05-26 DIAGNOSIS — Z880 Allergy status to penicillin: Secondary | ICD-10-CM | POA: Insufficient documentation

## 2016-05-26 DIAGNOSIS — F17210 Nicotine dependence, cigarettes, uncomplicated: Secondary | ICD-10-CM | POA: Diagnosis not present

## 2016-05-26 NOTE — Anesthesia Postprocedure Evaluation (Signed)
Anesthesia Post Note  Patient: Edward Spence  Procedure(s) Performed: Procedure(s) (LRB): VIDEO BRONCHOSCOPY WITH ENDOBRONCHIAL ULTRASOUND (N/A)  Patient location during evaluation: PACU Anesthesia Type: General Level of consciousness: awake and alert and patient cooperative Pain management: pain level controlled Vital Signs Assessment: post-procedure vital signs reviewed and stable Respiratory status: spontaneous breathing and respiratory function stable Cardiovascular status: stable Anesthetic complications: no    Last Vitals:  Vitals:   05/22/16 0950 05/22/16 0959  BP: 105/74 105/63  Pulse: 78 83  Resp: 17 18  Temp:      Last Pain:  Vitals:   05/22/16 0618  TempSrc: Oral                 Alonnah Lampkins S

## 2016-05-26 NOTE — Progress Notes (Signed)
Radiation Oncology         [phone] ________________________________  Name: Edward Spence MRN: [id number]  Date: 05/26/2016  DOB: 1956-02-28  [id number], [name], FNP  Curt Bears, MD     REFERRING PHYSICIAN: Curt Bears, MD   DIAGNOSIS: There were no encounter diagnoses.  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 male seen at the request of Dr. Julien Nordmann regarding lung cancer. The patient developed a dry cough, hoarseness, and weight loss of around 25 lbs in the the past 3 months. He saw his PCP who ordered a chest X-ray on 05/08/16. This noted a prominent right paratracheal mass with shift of the trachea to the left. CT of the chest w/ contrast on 05/14/16 showed a right upper lobe/superior mediastinal mass measuring 5.4 x 7.3 x 5.9 cm exerting mass effect upon great vessels and displacing the trachea from the right to the left, right mediastinal adenopathy, right paratracheal adenopathy, right suprahilar adenopathy, and a spiculated right upper lobe mass measuring 1.4 x 1.2 cm.  The patient presented to Dr. Julien Nordmann on 05/19/16 who recommended a PET scan, MRI of the brain, and a referral to Dr. Roxan Hockey for consideration of bronchoscopy and mediastinoscopy for tissue diagnosis. Biopsies were performed on 05/22/16 and the results are pending, however the initial slide came back showing non-small cell carcinoma. Endobronchial ultrasound noted a large right paratracheal mass (unclear if this is a primary mass or peritracheal adenopathy). Flexible fiberoptic bronchoscopy revealed a normal endobronchial anatomy with no endobronchial lesion seen to the level of the subsegmental bronchi.  The patient presents today to discuss the role of radiation in the management of his disease.  PREVIOUS RADIATION THERAPY: No   PAST MEDICAL HISTORY:  Past Medical History:   Diagnosis Date  . Lung mass 05/19/2016  . Pneumonia    in college       PAST SURGICAL HISTORY: Past Surgical History:  Procedure Laterality Date  . APPENDECTOMY    . VIDEO BRONCHOSCOPY WITH ENDOBRONCHIAL ULTRASOUND N/A 05/22/2016   Procedure: VIDEO BRONCHOSCOPY WITH ENDOBRONCHIAL ULTRASOUND;  Surgeon: [name], MD;  Location: [facility];  Service: Thoracic;  Laterality: N/A;     FAMILY HISTORY:  Family History  Problem Relation Age of Onset  . Cancer Mother   . COPD Father   . Heart failure Father      SOCIAL HISTORY:  reports that he has been smoking Cigarettes.  He has a 40.00 pack-year smoking history. He has never used smokeless tobacco. He reports that he does not drink alcohol or use drugs.   ALLERGIES: Penicillins   MEDICATIONS:  No current outpatient prescriptions on file.   No current facility-administered medications for this encounter.      REVIEW OF SYSTEMS: On review of systems, the patient reports that he is doing well overall. He denies any chest pain, shortness of breath,fevers, chills, night sweats, any bowel or bladder disturbances, abdominal pain, changes in vision, nausea, or vomiting. He reports a cough with white phlegm, hoarseness, and a weight loss of 25-30 lbs (from a lack of appetite). He denies any new musculoskeletal or joint aches or pains. A complete review of systems is obtained and is otherwise negative.   PHYSICAL EXAM:  height is '6\' 2"'$  (1.88 m) and weight is 193 lb 11.2 oz (87.9 kg). His oral temperature is 97.7 F (36.5 C). His blood  pressure is 105/78 and his pulse is 91. His respiration is 20 and oxygen saturation is 100%.   General: Alert and oriented, in no acute distress HEENT: Head is normocephalic. Extraocular movements are intact. Oropharynx is clear. Neck: Neck is supple, no palpable cervical or supraclavicular lymphadenopathy. Heart: Regular in rate and rhythm with no murmurs, rubs, or gallops. Chest: Clear to  auscultation bilaterally, with no rhonchi, wheezes, or rales. Abdomen: Soft, nontender, nondistended, with no rigidity or guarding. Extremities: No cyanosis or edema. Lymphatics: see Neck Exam Skin: No concerning lesions. Musculoskeletal: symmetric strength and muscle tone throughout. Neurologic: Cranial nerves II through XII are grossly intact. No obvious focalities. Speech is fluent. Coordination is intact. Psychiatric: Judgment and insight are intact. Affect is appropriate.  ECOG = 1  0 - Asymptomatic (Fully active, able to carry on all predisease activities without restriction)  1 - Symptomatic but completely ambulatory (Restricted in physically strenuous activity but ambulatory and able to carry out work of a light or sedentary nature. For example, light housework, office work)  2 - Symptomatic, <50% in bed during the day (Ambulatory and capable of all self care but unable to carry out any work activities. Up and about more than 50% of waking hours)  3 - Symptomatic, >50% in bed, but not bedbound (Capable of only limited self-care, confined to bed or chair 50% or more of waking hours)  4 - Bedbound (Completely disabled. Cannot carry on any self-care. Totally confined to bed or chair)  5 - Death   Eustace Pen MM, Creech RH, Tormey DC, et al. [phone]). "Toxicity and response criteria of the [facility]". [place] Oncol. 5 (6): 649-55    LABORATORY DATA:  Lab Results  Component Value Date   WBC 4.7 05/22/2016   HGB 13.9 05/22/2016   HCT 43.5 05/22/2016   MCV 85.3 05/22/2016   PLT 186 05/22/2016   Lab Results  Component Value Date   NA 137 05/22/2016   K 4.4 05/22/2016   CL 107 05/22/2016   CO2 23 05/22/2016   Lab Results  Component Value Date   ALT 32 05/22/2016   AST 48 (H) 05/22/2016   ALKPHOS 144 (H) 05/22/2016   BILITOT 1.1 05/22/2016      RADIOGRAPHY: Dg Chest 2 View  Result Date: 05/22/2016 CLINICAL DATA:  Preop for right lung biopsy.  EXAM: CHEST  2 VIEW COMPARISON:  CT 05/14/2016, radiographs 05/08/2016 FINDINGS: Right paratracheal mass causing leftward tracheal deviation, unchanged. Spiculated right apical nodule is not as well visualized radiographically. Right high ulna fullness corresponds to adenopathy on CT. Mild emphysema. The heart size is unchanged. No pulmonary edema, pleural effusion, or new focal opacity. Remote left rib fracture. IMPRESSION: 1. Unchanged appearance of the chest from recent radiographs and CT. No acute abnormality. 2. Right paratracheal and right mediastinal adenopathy. Right apical nodule was better visualized on CT. Electronically Signed   By: Jeb Levering M.D.   On: 05/22/2016 06:48   Dg Chest 2 View  Result Date: 05/08/2016 CLINICAL DATA:  Cough. EXAM: CHEST  2 VIEW COMPARISON:  No recent. FINDINGS: Right paratracheal mass is noted. This could be of pulmonary, pleural, vascular/ mediastinal origin. Mass results in shift of the trachea to the left. Contrast-enhanced chest CT suggested for further evaluation. Mild bilateral from interstitial prominence, most likely related chronic interstitial lung disease. No focal alveolar infiltrate. No pleural effusion or pneumothorax. Old left posterior eighth rib fracture. IMPRESSION: Prominent right paratracheal mass with shift of the  trachea to the left. Contrast-enhanced chest CT suggested for further evaluation. These results will be called to the ordering clinician or representative by the Radiologist Assistant, and communication documented in the PACS or zVision Dashboard. Electronically Signed   By: Marcello Moores  Register   On: 05/08/2016 13:03   Ct Chest W Contrast  Result Date: 05/14/2016 CLINICAL DATA:  RIGHT paratracheal mass, hoarseness, 25 pound weight loss in 3-4 months EXAM: CT CHEST WITH CONTRAST TECHNIQUE: Multidetector CT imaging of the chest was performed during intravenous contrast administration. Sagittal and coronal MPR images reconstructed from  axial data set. CONTRAST:  81m ISOVUE-300 IOPAMIDOL (ISOVUE-300) INJECTION 61% IV COMPARISON:  Chest radiograph 05/08/2016 FINDINGS: Cardiovascular: Minimal coronary arterial calcification. Aorta normal caliber. Compression of SVC by mediastinal mass/ adenopathy. Minimal pericardial effusion. Mediastinum/Nodes: Superior mediastinal mass identified at RIGHT superior mediastinum 5.4 x 7.3 x 5.9 cm in size, exerting mass effect upon great vessels and displacing trachea from RIGHT to LEFT. Adjacent confluent mediastinal adenopathy including 2.3 cm and 2.8 cm RIGHT paratracheal nodes and a 3.4 cm RIGHT suprahilar node. Additional scattered normal size mediastinal nodes are seen an AP window and prevascular. 11 mm short axis subcarinal node image 69. Esophagus unremarkable. Lungs/Pleura: 14 x 12 mm spiculated RIGHT upper lobe nodule image 28. Significant central peribronchial thickening RIGHT upper lobe. Large mass at superior mediastinum question arising within mediastinum or within medial RIGHT upper lobe invading mediastinum. Anterior RIGHT upper lobe infiltrate beyond the mass. Underlying emphysematous changes. Tiny 3 mm RIGHT upper lobe nodule image 44. 3 mm RIGHT lower lobe nodule image 91. 4 mm subpleural nodule LEFT lower lobe image 82. No additional infiltrate, pleural effusion, or pneumothorax. No endobronchial lesions identified. Upper Abdomen: Visualized upper abdomen unremarkable Musculoskeletal: Old healed fractures LEFT fifth and eighth ribs. No bone destruction. IMPRESSION: Spiculated RIGHT upper lobe masses 14 x 12 mm question neoplasm. Large medial RIGHT upper lobe/superior mediastinal mass 5.4 x 7.3 x 5.9 cm question RIGHT upper lobe neoplasm invading mediastinum versus mediastinal mass/adenopathy, exerting mass effect upon trachea and great vessels. Additional enlarged confluent RIGHT mediastinal and RIGHT suprahilar lymph nodes. Tissue diagnosis recommended. Underlying emphysematous changes with  peribronchial thickening in postobstructive infiltrate versus tumor in the RIGHT upper lobe. Three tiny nonspecific RIGHT lung nodules. These results will be called to the ordering clinician or representative by the Radiologist Assistant, and communication documented in the PACS or zVision Dashboard. Electronically Signed   By: MLavonia DanaM.D.   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 Once the patient has his staging completed we could make a final decision regarding his treatment plan. The patient likely will benefit from at least some radiation treatment given his presentation.  PLAN: Today, I talked to the patient about the findings and work-up thus far.  We discussed the natural history of right lung carcinoma and general treatment, highlighting the role of radiotherapy in the management.  We discussed the available radiation techniques, and focused on the details of logistics and delivery.  We reviewed the anticipated acute and late sequelae associated with radiation in this setting.  The patient was encouraged to ask questions that I answered to the best of my ability.  The patient filled out a consent form during our discussion and we retained a copy for our records.  The patient would like  to proceed with radiation and will be scheduled for CT simulation this Wednesday.  PET scan and brain MRI are scheduled on 05/29/16 and the patient is to follow up with Dr. Julien Nordmann on 06/05/16. In the interim, I advised the patient to increase his caloric intake through Boost, Ensure, etc., and more frequent meals.  ------------------------------------------------  Jodelle Gross, MD, PhD  This document serves as a record of services personally performed by Kyung Rudd, MD. It was created on his behalf by Darcus Austin, a trained medical  scribe. The creation of this record is based on the scribe's personal observations and the provider's statements to them. This document has been checked and approved by the attending provider.

## 2016-05-26 NOTE — Progress Notes (Signed)
Please see the Nurse Progress Note in the MD Initial Consult Encounter for this patient. 

## 2016-05-28 ENCOUNTER — Ambulatory Visit
Admission: RE | Admit: 2016-05-28 | Discharge: 2016-05-28 | Disposition: A | Discharge status: Home / Self Care | Payer: Medicaid Other | Source: Ambulatory Visit | Attending: Radiation Oncology | Admitting: Radiation Oncology

## 2016-05-28 DIAGNOSIS — C3411 Malignant neoplasm of upper lobe, right bronchus or lung: Secondary | ICD-10-CM

## 2016-05-28 DIAGNOSIS — Z510 Encounter for antineoplastic radiation therapy: Secondary | ICD-10-CM | POA: Diagnosis not present

## 2016-05-29 ENCOUNTER — Ambulatory Visit (HOSPITAL_COMMUNITY)
Admission: RE | Admit: 2016-05-29 | Discharge: 2016-05-29 | Disposition: A | Discharge status: Home / Self Care | Payer: Medicaid Other | Source: Ambulatory Visit | Attending: Internal Medicine | Admitting: Internal Medicine

## 2016-05-29 DIAGNOSIS — C801 Malignant (primary) neoplasm, unspecified: Secondary | ICD-10-CM | POA: Insufficient documentation

## 2016-05-29 DIAGNOSIS — R918 Other nonspecific abnormal finding of lung field: Secondary | ICD-10-CM

## 2016-05-29 DIAGNOSIS — G8389 Other specified paralytic syndromes: Secondary | ICD-10-CM | POA: Insufficient documentation

## 2016-05-29 DIAGNOSIS — C7951 Secondary malignant neoplasm of bone: Secondary | ICD-10-CM | POA: Diagnosis not present

## 2016-05-29 DIAGNOSIS — R590 Localized enlarged lymph nodes: Secondary | ICD-10-CM | POA: Insufficient documentation

## 2016-05-29 LAB — GLUCOSE, CAPILLARY: Glucose-Capillary: 95 mg/dL (ref 65–99)

## 2016-05-29 MED ORDER — FLUDEOXYGLUCOSE F - 18 (FDG) INJECTION
12.1000 | Freq: Once | INTRAVENOUS | Status: AC | PRN
  Administered 2016-05-29: 12.1 via INTRAVENOUS

## 2016-05-29 MED ORDER — GADOBENATE DIMEGLUMINE 529 MG/ML IV SOLN
20.0000 mL | Freq: Once | INTRAVENOUS | Status: AC | PRN
  Administered 2016-05-29: 20 mL via INTRAVENOUS

## 2016-05-30 DIAGNOSIS — Z510 Encounter for antineoplastic radiation therapy: Secondary | ICD-10-CM | POA: Diagnosis not present

## 2016-06-04 ENCOUNTER — Other Ambulatory Visit: Payer: Self-pay | Admitting: Medical Oncology

## 2016-06-04 ENCOUNTER — Telehealth: Payer: Self-pay | Admitting: *Deleted

## 2016-06-04 DIAGNOSIS — C3411 Malignant neoplasm of upper lobe, right bronchus or lung: Secondary | ICD-10-CM

## 2016-06-04 NOTE — Telephone Encounter (Signed)
Called patient and informed him of his Pets  And MRI per [name], PA, that the Pet scan doesn't show any new areas of disease than already nticipated,just the lung and regional lymph node that they sae on CT, His Brain MRI was negative, called Aaron Edelman RT therapisy in Ct sim ,notified to move forward with his sim apt, patient thanked Bryson Ha for the results 9:05 AM

## 2016-06-05 ENCOUNTER — Other Ambulatory Visit (HOSPITAL_BASED_OUTPATIENT_CLINIC_OR_DEPARTMENT_OTHER): Payer: Medicaid Other

## 2016-06-05 ENCOUNTER — Ambulatory Visit: Payer: Self-pay

## 2016-06-05 ENCOUNTER — Encounter: Payer: Self-pay | Admitting: Internal Medicine

## 2016-06-05 ENCOUNTER — Ambulatory Visit (HOSPITAL_BASED_OUTPATIENT_CLINIC_OR_DEPARTMENT_OTHER): Payer: Medicaid Other | Admitting: Internal Medicine

## 2016-06-05 ENCOUNTER — Telehealth: Payer: Self-pay | Admitting: Internal Medicine

## 2016-06-05 ENCOUNTER — Encounter: Payer: Self-pay | Admitting: *Deleted

## 2016-06-05 VITALS — BP 115/67 | HR 84 | Temp 97.8°F | Resp 17 | Ht 74.0 in | Wt 188.7 lb

## 2016-06-05 DIAGNOSIS — C3411 Malignant neoplasm of upper lobe, right bronchus or lung: Secondary | ICD-10-CM

## 2016-06-05 DIAGNOSIS — C7951 Secondary malignant neoplasm of bone: Secondary | ICD-10-CM | POA: Diagnosis not present

## 2016-06-05 LAB — CBC WITH DIFFERENTIAL/PLATELET
BASO%: 0.5 % (ref 0.0–2.0)
Basophils Absolute: 0 10*3/uL (ref 0.0–0.1)
EOS ABS: 0.1 10*3/uL (ref 0.0–0.5)
EOS%: 2.4 % (ref 0.0–7.0)
HCT: 44 % (ref 38.4–49.9)
HGB: 14.2 g/dL (ref 13.0–17.1)
LYMPH%: 27.6 % (ref 14.0–49.0)
MCH: 27.2 pg (ref 27.2–33.4)
MCHC: 32.3 g/dL (ref 32.0–36.0)
MCV: 84.3 fL (ref 79.3–98.0)
MONO#: 0.4 10*3/uL (ref 0.1–0.9)
MONO%: 9.5 % (ref 0.0–14.0)
NEUT#: 2.5 10*3/uL (ref 1.5–6.5)
NEUT%: 60 % (ref 39.0–75.0)
PLATELETS: 195 10*3/uL (ref 140–400)
RBC: 5.22 10*6/uL (ref 4.20–5.82)
RDW: 15.7 % — ABNORMAL HIGH (ref 11.0–14.6)
WBC: 4.2 10*3/uL (ref 4.0–10.3)
lymph#: 1.2 10*3/uL (ref 0.9–3.3)

## 2016-06-05 LAB — COMPREHENSIVE METABOLIC PANEL
ALT: 10 U/L (ref 0–55)
ANION GAP: 9 meq/L (ref 3–11)
AST: 25 U/L (ref 5–34)
Albumin: 3.4 g/dL — ABNORMAL LOW (ref 3.5–5.0)
Alkaline Phosphatase: 100 U/L (ref 40–150)
BILIRUBIN TOTAL: 0.56 mg/dL (ref 0.20–1.20)
BUN: 7.1 mg/dL (ref 7.0–26.0)
CHLORIDE: 105 meq/L (ref 98–109)
CO2: 26 meq/L (ref 22–29)
Calcium: 9.8 mg/dL (ref 8.4–10.4)
Creatinine: 0.8 mg/dL (ref 0.7–1.3)
Glucose: 105 mg/dl (ref 70–140)
Potassium: 4.7 mEq/L (ref 3.5–5.1)
Sodium: 140 mEq/L (ref 136–145)
Total Protein: 7.6 g/dL (ref 6.4–8.3)

## 2016-06-05 NOTE — Progress Notes (Signed)
Oncology Nurse Navigator Documentation  Oncology Nurse Navigator Flowsheets 06/05/2016  Navigator Encounter Type Clinic/MDC/spoke with patient and friend today at cancer center. He is in need of gas cards.  I gave him a gas card from the [employer] and helped him complete those forms.  I also helped him fill out Lung cancer initiative forms for another gas card.  He was thankful for the help. I faxed lung cancer initiative form.   Patient Visit Type MedOnc  Treatment Phase Pre-Tx/Tx Discussion  Barriers/Navigation Needs Coordination of Care;Financial  Interventions Other;Coordination of Care  Coordination of Care Other  Acuity Level 2  Acuity Level 2 Other  Time Spent with Patient 30

## 2016-06-05 NOTE — Telephone Encounter (Signed)
Patient sent back to lab and given avs report and appointments for September

## 2016-06-05 NOTE — Progress Notes (Signed)
Emailed completed Duanne Limerick financial aid application to [employer] for processing.

## 2016-06-05 NOTE — Progress Notes (Signed)
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  PRIOR THERAPY: Palliative radiation to the right superior mediastinal mass and right paratracheal lymphadenopathy under the care of Dr. Lisbeth Renshaw.  CURRENT THERAPY: None.  INTERVAL HISTORY: Edward Spence 60 y.o. male returns to the clinic today for follow-up visit accompanied by his friend. The patient is feeling fine today except for the hoarseness of his voice and difficulty swallowing. He also continues to have shortness breath with exertion. He denied having any significant weight loss or night sweats. He has no nausea or vomiting. He had several studies performed recently including PET scan as well as MRI of the brain. He was also seen by Dr. Roxan Hockey and on 05/22/2016 he underwent a video bronchoscopy with endobronchial biopsy and mediastinal lymph node aspiration. The final pathology (Accession: [id number]) showed malignant cells consistent with non-small cell carcinoma. The malignant cells were positive for TTF-1 and faint staining for CD 56 and negative cytokeratin 5/6. The findings are most consistent with lung adenocarcinoma. There was insufficient tissue for additional studies. The patient is here today for evaluation and discussion of his treatment options. He was seen by Dr. Lisbeth Renshaw and expected to start palliative radiotherapy to the large right paratracheal mass and mediastinal lymph node.  MEDICAL HISTORY: Past Medical History:  Diagnosis Date  . Lung mass 05/19/2016  . Pneumonia    in college    ALLERGIES:  is  allergic to penicillins.  MEDICATIONS:  No current outpatient prescriptions on file.   No current facility-administered medications for this visit.     SURGICAL HISTORY:  Past Surgical History:  Procedure Laterality Date  . APPENDECTOMY    . VIDEO BRONCHOSCOPY WITH ENDOBRONCHIAL ULTRASOUND N/A 05/22/2016   Procedure: VIDEO BRONCHOSCOPY WITH ENDOBRONCHIAL ULTRASOUND;  Surgeon: [name], MD;  Location: [place];  Service: Thoracic;  Laterality: N/A;    REVIEW OF SYSTEMS:  Constitutional: positive for fatigue Eyes: negative Ears, nose, mouth, throat, and face: positive for hoarseness Respiratory: positive for cough and dyspnea on exertion Cardiovascular: negative Gastrointestinal: negative Genitourinary:negative Integument/breast: negative Hematologic/lymphatic: negative Musculoskeletal:negative Neurological: negative Behavioral/Psych: negative Endocrine: negative Allergic/Immunologic: negative   PHYSICAL EXAMINATION: General appearance: alert, cooperative, fatigued and no distress Head: Normocephalic, without obvious abnormality, atraumatic Neck: no adenopathy, no JVD, supple, symmetrical, trachea midline and thyroid not enlarged, symmetric, no tenderness/mass/nodules Lymph nodes: Cervical, supraclavicular, and axillary nodes normal. Resp: clear to auscultation bilaterally Back: symmetric, no curvature. ROM normal. No CVA tenderness. Cardio: regular rate and rhythm, S1, S2 normal, no murmur, click, rub or gallop GI: soft, non-tender; bowel sounds normal; no masses,  no organomegaly Extremities: extremities normal, atraumatic, no cyanosis or edema Neurologic: Alert and oriented X 3, normal strength and tone. Normal symmetric reflexes. Normal coordination and gait  ECOG PERFORMANCE STATUS: 1 - Symptomatic but completely ambulatory  Blood pressure 115/67, pulse 84, temperature 97.8 F (36.6 C), temperature source Oral, resp. rate 17, height '6\' 2"'$  (1.88 m), weight 188  lb 11.2 oz (85.6 kg), SpO2 100 %.  LABORATORY DATA: Lab Results  Component Value Date   WBC 4.2 06/05/2016   HGB 14.2 06/05/2016  HCT 44.0 06/05/2016   MCV 84.3 06/05/2016   PLT 195 06/05/2016      Chemistry      Component Value Date/Time   NA 140 06/05/2016 1046   K 4.7 06/05/2016 1046   CL 107 05/22/2016 0641   CO2 26 06/05/2016 1046   BUN 7.1 06/05/2016 1046   CREATININE 0.8 06/05/2016 1046      Component Value Date/Time   CALCIUM 9.8 06/05/2016 1046   ALKPHOS 100 06/05/2016 1046   AST 25 06/05/2016 1046   ALT 10 06/05/2016 1046   BILITOT 0.56 06/05/2016 1046       RADIOGRAPHIC STUDIES: Dg Chest 2 View  Result Date: 05/22/2016 CLINICAL DATA:  Preop for right lung biopsy. EXAM: CHEST  2 VIEW COMPARISON:  CT 05/14/2016, radiographs 05/08/2016 FINDINGS: Right paratracheal mass causing leftward tracheal deviation, unchanged. Spiculated right apical nodule is not as well visualized radiographically. Right high ulna fullness corresponds to adenopathy on CT. Mild emphysema. The heart size is unchanged. No pulmonary edema, pleural effusion, or new focal opacity. Remote left rib fracture. IMPRESSION: 1. Unchanged appearance of the chest from recent radiographs and CT. No acute abnormality. 2. Right paratracheal and right mediastinal adenopathy. Right apical nodule was better visualized on CT. Electronically Signed   By: Jeb Levering M.D.   On: 05/22/2016 06:48   Dg Chest 2 View  Result Date: 05/08/2016 CLINICAL DATA:  Cough. EXAM: CHEST  2 VIEW COMPARISON:  No recent. FINDINGS: Right paratracheal mass is noted. This could be of pulmonary, pleural, vascular/ mediastinal origin. Mass results in shift of the trachea to the left. Contrast-enhanced chest CT suggested for further evaluation. Mild bilateral from interstitial prominence, most likely related chronic interstitial lung disease. No focal alveolar infiltrate. No pleural effusion or pneumothorax. Old left posterior eighth rib  fracture. IMPRESSION: Prominent right paratracheal mass with shift of the trachea to the left. Contrast-enhanced chest CT suggested for further evaluation. These results will be called to the ordering clinician or representative by the Radiologist Assistant, and communication documented in the PACS or zVision Dashboard. Electronically Signed   By: Marcello Moores  Register   On: 05/08/2016 13:03   Ct Chest W Contrast  Result Date: 05/14/2016 CLINICAL DATA:  RIGHT paratracheal mass, hoarseness, 25 pound weight loss in 3-4 months EXAM: CT CHEST WITH CONTRAST TECHNIQUE: Multidetector CT imaging of the chest was performed during intravenous contrast administration. Sagittal and coronal MPR images reconstructed from axial data set. CONTRAST:  25m ISOVUE-300 IOPAMIDOL (ISOVUE-300) INJECTION 61% IV COMPARISON:  Chest radiograph 05/08/2016 FINDINGS: Cardiovascular: Minimal coronary arterial calcification. Aorta normal caliber. Compression of SVC by mediastinal mass/ adenopathy. Minimal pericardial effusion. Mediastinum/Nodes: Superior mediastinal mass identified at RIGHT superior mediastinum 5.4 x 7.3 x 5.9 cm in size, exerting mass effect upon great vessels and displacing trachea from RIGHT to LEFT. Adjacent confluent mediastinal adenopathy including 2.3 cm and 2.8 cm RIGHT paratracheal nodes and a 3.4 cm RIGHT suprahilar node. Additional scattered normal size mediastinal nodes are seen an AP window and prevascular. 11 mm short axis subcarinal node image 69. Esophagus unremarkable. Lungs/Pleura: 14 x 12 mm spiculated RIGHT upper lobe nodule image 28. Significant central peribronchial thickening RIGHT upper lobe. Large mass at superior mediastinum question arising within mediastinum or within medial RIGHT upper lobe invading mediastinum. Anterior RIGHT upper lobe infiltrate beyond the mass. Underlying emphysematous changes. Tiny 3 mm RIGHT upper lobe nodule image 44. 3 mm RIGHT lower lobe nodule image 91. 4 mm subpleural nodule  LEFT lower lobe image 82.  No additional infiltrate, pleural effusion, or pneumothorax. No endobronchial lesions identified. Upper Abdomen: Visualized upper abdomen unremarkable Musculoskeletal: Old healed fractures LEFT fifth and eighth ribs. No bone destruction. IMPRESSION: Spiculated RIGHT upper lobe masses 14 x 12 mm question neoplasm. Large medial RIGHT upper lobe/superior mediastinal mass 5.4 x 7.3 x 5.9 cm question RIGHT upper lobe neoplasm invading mediastinum versus mediastinal mass/adenopathy, exerting mass effect upon trachea and great vessels. Additional enlarged confluent RIGHT mediastinal and RIGHT suprahilar lymph nodes. Tissue diagnosis recommended. Underlying emphysematous changes with peribronchial thickening in postobstructive infiltrate versus tumor in the RIGHT upper lobe. Three tiny nonspecific RIGHT lung nodules. These results will be called to the ordering clinician or representative by the Radiologist Assistant, and communication documented in the PACS or zVision Dashboard. Electronically Signed   By: Lavonia Dana M.D.   On: 05/14/2016 16:19   Mr Jeri Cos BZ Contrast  Result Date: 05/29/2016 CLINICAL DATA:  60 y/o M; newly diagnosed lung cancer. Initial encounter. EXAM: MRI HEAD WITHOUT AND WITH CONTRAST TECHNIQUE: Multiplanar, multiecho pulse sequences of the brain and surrounding structures were obtained without and with intravenous contrast. CONTRAST:  74m MULTIHANCE GADOBENATE DIMEGLUMINE 529 MG/ML IV SOLN COMPARISON:  None. FINDINGS: Brain: No diffusion restriction to suggest acute infarct. No abnormal susceptibility hypointensity to indicate intracranial hemorrhage. Mild chronic microvascular ischemic changes. No abnormal enhancement is identified. Extra-axial space: No hydrocephalus. No extra-axial collection is identified. Proximal intracranial flow voids are maintained. Other: No abnormal signal of the paranasal sinuses. No abnormal signal of the mastoid air cells. Orbits are  unremarkable. Calvarium is unremarkable. IMPRESSION: No metastatic disease is identified. No acute intracranial abnormality. Mild chronic microvascular ischemic changes. Electronically Signed   By: LKristine GarbeM.D.   On: 05/29/2016 18:52   Nm Pet Image Initial (pi) Skull Base To Thigh  Result Date: 05/29/2016 CLINICAL DATA:  Initial treatment strategy for right upper lobe lung nodule and mediastinal adenopathy. EXAM: NUCLEAR MEDICINE PET SKULL BASE TO THIGH TECHNIQUE: 12.1 mCi F-18 FDG was injected intravenously. Full-ring PET imaging was performed from the skull base to thigh after the radiotracer. CT data was obtained and used for attenuation correction and anatomic localization. FASTING BLOOD GLUCOSE:  Value: 95 mg/dl COMPARISON:  Chest CT 05/14/2016 FINDINGS: NECK No hypermetabolic lymph nodes in the neck. Hypermetabolism is noted in the left vocal cord. The right vocal cord is likely paralyzed. It is centralized and thickened in appearance. The right recurrent laryngeal nerve is likely effecting but the right mediastinal mass. CHEST The right upper lobe pulmonary nodule is hypermetabolic. SUV max is 13. Bulky mediastinal and right hilar lymphadenopathy is also markedly hypermetabolic with SUV max of 116.9 There are also small hypermetabolic anterior mediastinal nodes and a contralateral left hilar lymph node which has an SUV max of 4.3. No other lung lesions are identified. ABDOMEN/PELVIS No abnormal hypermetabolic activity within the liver, pancreas, adrenal glands, or spleen. No hypermetabolic lymph nodes in the abdomen or pelvis. Anal rectal hypermetabolism is likely due to hemorrhoids. SKELETON Hypermetabolic bone lesions are noted in the right iliac bone and L5 vertebral body. Suspect small bilateral rib lesions also. IMPRESSION: 1. Hypermetabolic spiculated right apical lung lesion consistent with neoplasm. There is associated bulky hypermetabolic mediastinal and right hilar  lymphadenopathy. 2. Hypermetabolic contralateral left hilar lymph node. 3. Osseous metastatic disease. 4. 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 Electronically Signed   By: PMarijo SanesM.D.   On: 05/29/2016 16:51  ASSESSMENT AND PLAN: This is a very pleasant 60 years old white male recently diagnosed with a stage IV non-small cell lung cancer, adenocarcinoma presented with large apical lung mass as well as bulky just final and right hilar adenopathy in addition to contralateral left hilar lymph nodes and osseous metastatic disease. 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 He is expected to start palliative radiotherapy to the large right apical lung mass and mediastinal lymphadenopathy seen on under the care of Dr. Lisbeth Renshaw. Unfortunately there was insufficient material for further molecular studies. I discussed with the patient's attending blood test to [facility] for molecular studies. I discussed with the patient several options for treatment of his condition including palliative care and hospice referral versus consideration of systemic therapy either with systemic chemotherapy/immunotherapy or target therapy if he has an actionable mutation. He is interested in treatment. I recommended for him to continue with his current palliative radiation for now. I will arrange for the patient follow-up appointment in 2-3 weeks for more detailed discussion of his treatment options once the molecular studies are available. He was advised to call immediately if he has any concerning symptoms in the interval. The patient voices understanding of current disease status and treatment options and is in agreement with the current care plan.  All questions were answered. The patient knows to call the clinic with any problems, questions or concerns. We can certainly see the patient much sooner if  necessary.  I spent 15 minutes counseling the patient face to face. The total time spent in the appointment was 25 minutes.  Disclaimer: This note was dictated with voice recognition software. Similar sounding words can inadvertently be transcribed and may not be corrected upon review.

## 2016-06-09 ENCOUNTER — Ambulatory Visit
Admission: RE | Admit: 2016-06-09 | Discharge: 2016-06-09 | Disposition: A | Discharge status: Home / Self Care | Payer: Medicaid Other | Source: Ambulatory Visit | Attending: Radiation Oncology | Admitting: Radiation Oncology

## 2016-06-09 DIAGNOSIS — Z510 Encounter for antineoplastic radiation therapy: Secondary | ICD-10-CM | POA: Diagnosis not present

## 2016-06-09 DIAGNOSIS — C7951 Secondary malignant neoplasm of bone: Secondary | ICD-10-CM

## 2016-06-10 ENCOUNTER — Ambulatory Visit
Admission: RE | Admit: 2016-06-10 | Discharge: 2016-06-10 | Disposition: A | Discharge status: Home / Self Care | Payer: Medicaid Other | Source: Ambulatory Visit | Attending: Radiation Oncology | Admitting: Radiation Oncology

## 2016-06-10 DIAGNOSIS — C3411 Malignant neoplasm of upper lobe, right bronchus or lung: Secondary | ICD-10-CM

## 2016-06-10 DIAGNOSIS — Z510 Encounter for antineoplastic radiation therapy: Secondary | ICD-10-CM | POA: Diagnosis not present

## 2016-06-10 MED ORDER — SONAFINE EX EMUL
1.0000 "application " | Freq: Two times a day (BID) | CUTANEOUS | Status: DC
  Administered 2016-06-10: 1 via TOPICAL

## 2016-06-10 NOTE — Progress Notes (Signed)
Pt teaching chest, radiation  Therapy and you book, my business card, sonafine cream discussed ways to manage side effects, skin iritation, throat changes,diffculty swallowing, hair loss, to chest,fatigue, coug, shortness breath, may need to eat 5-6 smaller meals instead of 3 regular meals, drink plenty water, increase protein and drinks high in calories, boost,etc, fatiogue, may need carafte rx to help with esophagitis,  Exercise some daily, verbal understanding, teach back given 12:48 PM

## 2016-06-11 ENCOUNTER — Ambulatory Visit
Admission: RE | Admit: 2016-06-11 | Discharge: 2016-06-11 | Disposition: A | Discharge status: Home / Self Care | Payer: Medicaid Other | Source: Ambulatory Visit | Attending: Radiation Oncology | Admitting: Radiation Oncology

## 2016-06-11 DIAGNOSIS — Z510 Encounter for antineoplastic radiation therapy: Secondary | ICD-10-CM | POA: Diagnosis not present

## 2016-06-12 ENCOUNTER — Ambulatory Visit
Admission: RE | Admit: 2016-06-12 | Discharge: 2016-06-12 | Disposition: A | Discharge status: Home / Self Care | Payer: Medicaid Other | Source: Ambulatory Visit | Attending: Radiation Oncology | Admitting: Radiation Oncology

## 2016-06-12 DIAGNOSIS — Z510 Encounter for antineoplastic radiation therapy: Secondary | ICD-10-CM | POA: Diagnosis not present

## 2016-06-13 ENCOUNTER — Ambulatory Visit
Admission: RE | Admit: 2016-06-13 | Discharge: 2016-06-13 | Disposition: A | Discharge status: Home / Self Care | Payer: Medicaid Other | Source: Ambulatory Visit | Attending: Radiation Oncology | Admitting: Radiation Oncology

## 2016-06-13 ENCOUNTER — Encounter: Payer: Self-pay | Admitting: Radiation Oncology

## 2016-06-13 ENCOUNTER — Ambulatory Visit: Admission: RE | Admit: 2016-06-13 | Payer: Medicaid Other | Source: Ambulatory Visit

## 2016-06-13 VITALS — BP 124/69 | HR 96 | Temp 98.4°F | Resp 18 | Ht 74.0 in | Wt 193.3 lb

## 2016-06-13 DIAGNOSIS — C7951 Secondary malignant neoplasm of bone: Secondary | ICD-10-CM | POA: Insufficient documentation

## 2016-06-13 DIAGNOSIS — C3411 Malignant neoplasm of upper lobe, right bronchus or lung: Secondary | ICD-10-CM

## 2016-06-13 DIAGNOSIS — Z510 Encounter for antineoplastic radiation therapy: Secondary | ICD-10-CM | POA: Diagnosis not present

## 2016-06-13 NOTE — Progress Notes (Signed)
[name] has received 4 fracrions to his right lung and right pelvis.  Skin to right chest with normal, using Sonafine bid.  Having some 57 white yo yellow and sore throat.  Having fatigue today. Wt Readings from Last 3 Encounters:  06/13/16 193 lb 4.8 oz (87.7 kg)  06/05/16 188 lb 11.2 oz (85.6 kg)  05/26/16 193 lb 11.2 oz (87.9 kg)  BP 124/69 (BP Location: Right Arm, Patient Position: Sitting, Cuff Size: Normal)   Pulse 96   Temp 98.4 F (36.9 C) (Oral)   Resp 18   Ht '6\' 2"'$  (1.88 m)   Wt 193 lb 4.8 oz (87.7 kg)   SpO2 99%   BMI 24.82 kg/m

## 2016-06-13 NOTE — Progress Notes (Signed)
  Radiation Oncology         [phone] ________________________________  Name: Edward Spence MRN: [id number]  Date: 05/28/2016  DOB: [birth year]  SIMULATION AND TREATMENT PLANNING NOTE  DIAGNOSIS:     ICD-9-CM ICD-10-CM   1. Malignant neoplasm of bronchus of right upper lobe (HCC) 162.3 C34.11      Site:  Right lung  NARRATIVE:  The patient was brought to the [facility].  Identity was confirmed.  All relevant records and images related to the planned course of therapy were reviewed.   Written consent to proceed with treatment was confirmed which was freely given after reviewing the details related to the planned course of therapy had been reviewed with the patient.  Then, the patient was set-up in a stable reproducible  supine position for radiation therapy.  CT images were obtained.  Surface markings were placed.    Medically necessary complex treatment device(s) for immobilization:  Customized vac lock bag.   The CT images were loaded into the planning software.  Then the target and avoidance structures were contoured.  Treatment planning then occurred.  The radiation prescription was entered and confirmed.  A total of 3 complex treatment devices were fabricated which relate to the designed radiation treatment fields. Each of these customized fields/ complex treatment devices will be used on a daily basis during the radiation course. I have requested : 3D Simulation  I have requested a DVH of the following structures: Target volume, spinal cord, lungs, esophagus.   The patient will undergo daily image guidance to ensure accurate localization of the target, and adequate minimize dose to the normal surrounding structures in close proximity to the target.   PLAN:  The patient will receive 37.5 Gy in 15 fractions.  Special treatment procedure The patient will also receive concurrent chemotherapy during the treatment. The patient may therefore experience increased  toxicity or side effects and the patient will be monitored for such problems. This may require extra lab work as necessary. This therefore constitutes a special treatment procedure.   ________________________________   Jodelle Gross, MD, PhD

## 2016-06-13 NOTE — Progress Notes (Signed)
   Department of Radiation Oncology  Phone:  [phone] Fax:        [phone]  Weekly Treatment Note    Name: Edward Spence Date: 06/13/2016 MRN: [id number] DOB: May 09, 1956   Diagnosis:     ICD-9-CM ICD-10-CM   1. Malignant neoplasm of bronchus of right upper lobe (HCC) 162.3 C34.11      Current dose: 12.5 Gy  Current fraction: 5   MEDICATIONS: Current Outpatient Prescriptions  Medication Sig Dispense Refill  . Wound Dressings (SONAFINE) Apply 1 application topically daily. Apply sonafine to chest area after rad tx daily  and prn     No current facility-administered medications for this encounter.      ALLERGIES: Penicillins   LABORATORY DATA:  Lab Results  Component Value Date   WBC 4.2 06/05/2016   HGB 14.2 06/05/2016   HCT 44.0 06/05/2016   MCV 84.3 06/05/2016   PLT 195 06/05/2016   Lab Results  Component Value Date   NA 140 06/05/2016   K 4.7 06/05/2016   CL 107 05/22/2016   CO2 26 06/05/2016   Lab Results  Component Value Date   ALT 10 06/05/2016   AST 25 06/05/2016   ALKPHOS 100 06/05/2016   BILITOT 0.56 06/05/2016     NARRATIVE: Edward Spence was seen today for weekly treatment management. The chart was checked and the patient's films were reviewed.  [name] has received 4 fracrions to his right lung and right pelvis.  Skin to right chest with normal, using Sonafine bid.  Having some 36 white yo yellow and sore throat.  Having fatigue today. Wt Readings from Last 3 Encounters:  06/13/16 193 lb 4.8 oz (87.7 kg)  06/05/16 188 lb 11.2 oz (85.6 kg)  05/26/16 193 lb 11.2 oz (87.9 kg)  BP 124/69 (BP Location: Right Arm, Patient Position: Sitting, Cuff Size: Normal)   Pulse 96   Temp 98.4 F (36.9 C) (Oral)   Resp 18   Ht '6\' 2"'$  (1.88 m)   Wt 193 lb 4.8 oz (87.7 kg)   SpO2 99%   BMI 24.82 kg/m   PHYSICAL EXAMINATION: height is '6\' 2"'$  (1.88 m) and weight is 193 lb 4.8 oz (87.7 kg). His oral temperature is 98.4 F  (36.9 C). His blood pressure is 124/69 and his pulse is 96. His respiration is 18 and oxygen saturation is 99%.        ASSESSMENT: The patient is doing satisfactorily with treatment.  PLAN: We will continue with the patient's radiation treatment as planned.

## 2016-06-13 NOTE — Progress Notes (Signed)
  Radiation Oncology         [phone] ________________________________  Name: Edward Spence MRN: [id number]  Date: 06/09/2016  DOB: 04/23/1956  SIMULATION AND TREATMENT PLANNING NOTE  DIAGNOSIS:     ICD-9-CM ICD-10-CM   1. Osseous metastasis ([place]) 198.5 C79.51      Site:  Right pelvis  NARRATIVE:  The patient was brought to the [facility] [place] [facility].  Identity was confirmed.  All relevant records and images related to the planned course of therapy were reviewed.   Written consent to proceed with treatment was confirmed which was freely given after reviewing the details related to the planned course of therapy had been reviewed with the patient.  Then, the patient was set-up in a stable reproducible  supine position for radiation therapy.  CT images were obtained.  Surface markings were placed.    Medically necessary complex treatment device(s) for immobilization:  Customized vac lock bag.   The CT images were loaded into the planning software.  Then the target and avoidance structures were contoured.  Treatment planning then occurred.  The radiation prescription was entered and confirmed.  A total of 4 complex treatment devices were fabricated which relate to the designed radiation treatment fields including 2 reduced fields. Each of these customized fields/ complex treatment devices will be used on a daily basis during the radiation course. I have requested : Isodose Plan.   PLAN:  The patient will receive 30 Gy in 10 fractions.  ________________________________   Jodelle Gross, MD, PhD

## 2016-06-16 ENCOUNTER — Ambulatory Visit
Admission: RE | Admit: 2016-06-16 | Discharge: 2016-06-16 | Disposition: A | Discharge status: Home / Self Care | Payer: Medicaid Other | Source: Ambulatory Visit | Attending: Radiation Oncology | Admitting: Radiation Oncology

## 2016-06-16 DIAGNOSIS — Z510 Encounter for antineoplastic radiation therapy: Secondary | ICD-10-CM | POA: Diagnosis not present

## 2016-06-17 ENCOUNTER — Ambulatory Visit
Admission: RE | Admit: 2016-06-17 | Discharge: 2016-06-17 | Disposition: A | Discharge status: Home / Self Care | Payer: Medicaid Other | Source: Ambulatory Visit | Attending: Radiation Oncology | Admitting: Radiation Oncology

## 2016-06-17 DIAGNOSIS — Z510 Encounter for antineoplastic radiation therapy: Secondary | ICD-10-CM | POA: Diagnosis not present

## 2016-06-18 ENCOUNTER — Ambulatory Visit
Admission: RE | Admit: 2016-06-18 | Discharge: 2016-06-18 | Disposition: A | Discharge status: Home / Self Care | Payer: Medicaid Other | Source: Ambulatory Visit | Attending: Radiation Oncology | Admitting: Radiation Oncology

## 2016-06-18 DIAGNOSIS — Z510 Encounter for antineoplastic radiation therapy: Secondary | ICD-10-CM | POA: Diagnosis not present

## 2016-06-19 ENCOUNTER — Ambulatory Visit
Admission: RE | Admit: 2016-06-19 | Discharge: 2016-06-19 | Disposition: A | Discharge status: Home / Self Care | Payer: Medicaid Other | Source: Ambulatory Visit | Attending: Radiation Oncology | Admitting: Radiation Oncology

## 2016-06-19 DIAGNOSIS — Z510 Encounter for antineoplastic radiation therapy: Secondary | ICD-10-CM | POA: Diagnosis not present

## 2016-06-20 ENCOUNTER — Ambulatory Visit
Admission: RE | Admit: 2016-06-20 | Discharge: 2016-06-20 | Disposition: A | Discharge status: Home / Self Care | Payer: Medicaid Other | Source: Ambulatory Visit | Attending: Radiation Oncology | Admitting: Radiation Oncology

## 2016-06-20 DIAGNOSIS — Z510 Encounter for antineoplastic radiation therapy: Secondary | ICD-10-CM | POA: Diagnosis not present

## 2016-06-20 DIAGNOSIS — C3411 Malignant neoplasm of upper lobe, right bronchus or lung: Secondary | ICD-10-CM

## 2016-06-20 NOTE — Progress Notes (Signed)
MD saw patient on the machine radat treatment area, not sent to nursing for assessment

## 2016-06-21 NOTE — Progress Notes (Signed)
   Department of Radiation Oncology  Phone:  [phone] Fax:        [phone]  Weekly Treatment Note    Name: Edward Spence Date: 06/21/2016 MRN: [id number] DOB: 20-Jul-1956   Diagnosis:     ICD-9-CM ICD-10-CM   1. Malignant neoplasm of bronchus of right upper lobe (HCC) 162.3 C34.11      Current dose: 22.5 Gy (lung)  Current fraction: 10 (total)   MEDICATIONS: Current Outpatient Prescriptions  Medication Sig Dispense Refill  . Wound Dressings (SONAFINE) Apply 1 application topically daily. Apply sonafine to chest area after rad tx daily  and prn     No current facility-administered medications for this encounter.      ALLERGIES: Penicillins   LABORATORY DATA:  Lab Results  Component Value Date   WBC 4.2 06/05/2016   HGB 14.2 06/05/2016   HCT 44.0 06/05/2016   MCV 84.3 06/05/2016   PLT 195 06/05/2016   Lab Results  Component Value Date   NA 140 06/05/2016   K 4.7 06/05/2016   CL 107 05/22/2016   CO2 26 06/05/2016   Lab Results  Component Value Date   ALT 10 06/05/2016   AST 25 06/05/2016   ALKPHOS 100 06/05/2016   BILITOT 0.56 06/05/2016     NARRATIVE: Edward Spence was seen today for weekly treatment management. The chart was checked and the patient's films were reviewed. The patient states that he is doing fairly well. No significant changes in his breathing. Patient had a number of questions which were answered today.   PHYSICAL EXAMINATION: vitals were not taken for this visit.     Alert, no acute distress. In good spirits.  ASSESSMENT: The patient is doing satisfactorily with treatment.  PLAN: We will continue with the patient's radiation treatment as planned.

## 2016-06-23 ENCOUNTER — Ambulatory Visit
Admission: RE | Admit: 2016-06-23 | Discharge: 2016-06-23 | Disposition: A | Discharge status: Home / Self Care | Payer: Medicaid Other | Source: Ambulatory Visit | Attending: Radiation Oncology | Admitting: Radiation Oncology

## 2016-06-23 DIAGNOSIS — Z510 Encounter for antineoplastic radiation therapy: Secondary | ICD-10-CM | POA: Diagnosis not present

## 2016-06-24 ENCOUNTER — Other Ambulatory Visit (HOSPITAL_BASED_OUTPATIENT_CLINIC_OR_DEPARTMENT_OTHER): Payer: Medicaid Other

## 2016-06-24 ENCOUNTER — Ambulatory Visit
Admission: RE | Admit: 2016-06-24 | Discharge: 2016-06-24 | Disposition: A | Discharge status: Home / Self Care | Payer: Medicaid Other | Source: Ambulatory Visit | Attending: Radiation Oncology | Admitting: Radiation Oncology

## 2016-06-24 ENCOUNTER — Ambulatory Visit (HOSPITAL_BASED_OUTPATIENT_CLINIC_OR_DEPARTMENT_OTHER): Payer: Medicaid Other | Admitting: Internal Medicine

## 2016-06-24 ENCOUNTER — Encounter: Payer: Self-pay | Admitting: Internal Medicine

## 2016-06-24 VITALS — BP 122/67 | HR 93 | Temp 98.0°F | Resp 20 | Ht 74.0 in | Wt 188.2 lb

## 2016-06-24 DIAGNOSIS — C7951 Secondary malignant neoplasm of bone: Secondary | ICD-10-CM

## 2016-06-24 DIAGNOSIS — C3411 Malignant neoplasm of upper lobe, right bronchus or lung: Secondary | ICD-10-CM

## 2016-06-24 DIAGNOSIS — Z5111 Encounter for antineoplastic chemotherapy: Secondary | ICD-10-CM

## 2016-06-24 DIAGNOSIS — Z510 Encounter for antineoplastic radiation therapy: Secondary | ICD-10-CM | POA: Diagnosis not present

## 2016-06-24 HISTORY — DX: Encounter for antineoplastic chemotherapy: Z51.11

## 2016-06-24 LAB — COMPREHENSIVE METABOLIC PANEL
ALT: 10 U/L (ref 0–55)
AST: 23 U/L (ref 5–34)
Albumin: 3.5 g/dL (ref 3.5–5.0)
Alkaline Phosphatase: 76 U/L (ref 40–150)
Anion Gap: 10 mEq/L (ref 3–11)
BILIRUBIN TOTAL: 0.49 mg/dL (ref 0.20–1.20)
BUN: 8.1 mg/dL (ref 7.0–26.0)
CHLORIDE: 105 meq/L (ref 98–109)
CO2: 26 meq/L (ref 22–29)
CREATININE: 0.8 mg/dL (ref 0.7–1.3)
Calcium: 9.9 mg/dL (ref 8.4–10.4)
EGFR: >90 mL/min/{1.73_m2} (ref >90)
GLUCOSE: 93 mg/dL (ref 70–140)
Potassium: 4.4 mEq/L (ref 3.5–5.1)
SODIUM: 141 meq/L (ref 136–145)
TOTAL PROTEIN: 7.8 g/dL (ref 6.4–8.3)

## 2016-06-24 LAB — CBC WITH DIFFERENTIAL/PLATELET
BASO%: 0.4 % (ref 0.0–2.0)
Basophils Absolute: 0 10*3/uL (ref 0.0–0.1)
EOS ABS: 0.1 10*3/uL (ref 0.0–0.5)
EOS%: 2.8 % (ref 0.0–7.0)
HCT: 42.9 % (ref 38.4–49.9)
HGB: 13.9 g/dL (ref 13.0–17.1)
LYMPH%: 8.7 % — AB (ref 14.0–49.0)
MCH: 27.3 pg (ref 27.2–33.4)
MCHC: 32.5 g/dL (ref 32.0–36.0)
MCV: 84 fL (ref 79.3–98.0)
MONO#: 0.3 10*3/uL (ref 0.1–0.9)
MONO%: 8.4 % (ref 0.0–14.0)
NEUT%: 79.7 % — ABNORMAL HIGH (ref 39.0–75.0)
NEUTROS ABS: 2.9 10*3/uL (ref 1.5–6.5)
Platelets: 170 10*3/uL (ref 140–400)
RBC: 5.11 10*6/uL (ref 4.20–5.82)
RDW: 16.4 % — AB (ref 11.0–14.6)
WBC: 3.7 10*3/uL — AB (ref 4.0–10.3)
lymph#: 0.3 10*3/uL — ABNORMAL LOW (ref 0.9–3.3)

## 2016-06-24 MED ORDER — FOLIC ACID 1 MG PO TABS: 1.0000 mg | ORAL_TABLET | Freq: Every day | ORAL | 4 refills | Status: DC

## 2016-06-24 MED ORDER — PROCHLORPERAZINE MALEATE 10 MG PO TABS: 10.0000 mg | ORAL_TABLET | Freq: Four times a day (QID) | ORAL | 0 refills | Status: DC | PRN

## 2016-06-24 MED ORDER — DEXAMETHASONE 4 MG PO TABS: ORAL_TABLET | ORAL | 1 refills | Status: DC

## 2016-06-24 MED ORDER — CYANOCOBALAMIN 1000 MCG/ML IJ SOLN
1000.0000 ug | Freq: Once | INTRAMUSCULAR | Status: AC
  Administered 2016-06-24: 1000 ug via INTRAMUSCULAR

## 2016-06-24 MED ORDER — CYANOCOBALAMIN 1000 MCG/ML IJ SOLN
INTRAMUSCULAR | Status: AC
  Filled 2016-06-24: qty 1

## 2016-06-24 MED FILL — PROCHLORPERAZINE 10 MG TAB: 10 | 7 days supply | Qty: 30 | Fill #0

## 2016-06-24 MED FILL — DEXAMETHASONE 4 MG TABLET: 4 | 20 days supply | Qty: 40 | Fill #0

## 2016-06-24 MED FILL — FOLIC ACID 1 MG TABLET: 1 | 30 days supply | Qty: 30 | Fill #0

## 2016-06-24 NOTE — Progress Notes (Signed)
START ON PATHWAY REGIMEN - Non-Small Cell Lung  [id number]: Carboplatin AUC=5 + Pemetrexed 500 mg/m2 + Bevacizumab 15 mg/kg q21 Days x 4 Cycles   A cycle is every 21 days:     Carboplatin (Paraplatin(R)) AUC=5 in 250 mL NS IV over 1 hour Dose Mod: None     Pemetrexed (Alimta(R)) 500 mg/m2 in 100 mL NS IV over 10 minutes, manufacturer recommends not administering to patients with CrCl < 45 mL/min Dose Mod: None     Bevacizumab (Avastin(R)) 15 mg/kg in 100 mL NS IV over 90 minutes first infusion, 60 minutes second infusion and 30 minutes all subsequent infusions if tolerated Dose Mod: None Additional Orders: * All AUC calculations intended to be used in [employer] formula Note: Patient to receive the following prior to the initiation of therapy: 1) Dexamethasone 4 mg orally twice daily x 6 doses.  First dose 24 hours before chemotherapy. 2) Folic acid >= 121 mcg orally daily.  First dose at least 5 days prior to the first dose of pemetrexed. 3) Vitamin B12 1,000 mcg intramuscularly every 9 weeks.  First dose at least 5 days prior to the first dose of pemetrexed.  **Always confirm dose/schedule in your pharmacy ordering system**    Patient Characteristics: Stage IV Metastatic, Non Squamous, Initial Chemotherapy/Immunotherapy, PS = 0, 1, PD-L1 Expression Positive 1-49% (TPS) / Negative / Not Tested / Not a Candidate for Immunotherapy AJCC M Stage: 1 AJCC N Stage: 3 AJCC T Stage: 3 Current Disease Status: Distant Metastases AJCC Stage Grouping: IV Histology: Non Squamous Cell ROS1 Rearrangement Status: Negative T790M Mutation Status: Not Applicable - EGFR Mutation Negative/Unknown Other Mutations/Biomarkers: No Other Actionable Mutations PD-L1 Expression Status: Quantity Not Sufficient Chemotherapy/Immunotherapy LOT: Initial Chemotherapy/Immunotherapy Molecular Targeted Therapy: Not Appropriate ALK Translocation Status: Negative Would you be surprised if this patient died  in the next year? I  would NOT be surprised if this patient died in the next year EGFR Mutation Status: Negative/Wild Type Performance Status: PS = 0, 1  Intent of Therapy: Non-Curative / Palliative Intent, Discussed with Patient

## 2016-06-24 NOTE — Progress Notes (Signed)
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  GUARDANT 360: Negative for EGFR, ALK, ROS 1, BR AF mutations but showed amplification of EGFR and MET  PRIOR THERAPY: Palliative radiation to the right superior mediastinal mass and right paratracheal lymphadenopathy under the care of Dr. Lisbeth Renshaw.  CURRENT THERAPY: Systemic chemotherapy with carboplatin for AUC of 5, Alimta 500 MG/M2 and Avastin 15 MG/KG every 3 weeks. First dose 07/01/2016..  INTERVAL HISTORY: Edward Spence 60 y.o. male returns to the clinic today for follow-up visit. The patient is feeling fine today except for the lump in his throat and occasional difficulty swallowing. He denied having any significant chest pain and has shortness breath with exertion with mild cough and no hemoptysis. He denied having any significant weight loss or night sweats. He has no nausea or vomiting, no fever or chills. He had several studies performed recently including molecular study which showed no actionable mutations. He is here today for evaluation and discussion of his treatment options based on the recent studies.  MEDICAL HISTORY: Past Medical History:  Diagnosis Date  . Lung mass 05/19/2016  . Pneumonia    in college    ALLERGIES:  is allergic to penicillins.  MEDICATIONS:  Current Outpatient Prescriptions  Medication Sig Dispense Refill  . Wound Dressings (SONAFINE) Apply 1 application topically daily. Apply sonafine to chest area after rad tx daily  and prn     No current facility-administered medications for this  visit.     SURGICAL HISTORY:  Past Surgical History:  Procedure Laterality Date  . APPENDECTOMY    . VIDEO BRONCHOSCOPY WITH ENDOBRONCHIAL ULTRASOUND N/A 05/22/2016   Procedure: VIDEO BRONCHOSCOPY WITH ENDOBRONCHIAL ULTRASOUND;  Surgeon: [name], MD;  Location: [facility];  Service: Thoracic;  Laterality: N/A;    REVIEW OF SYSTEMS:  Constitutional: positive for fatigue Eyes: negative Ears, nose, mouth, throat, and face: positive for hoarseness Respiratory: positive for cough and dyspnea on exertion Cardiovascular: negative Gastrointestinal: negative Genitourinary:negative Integument/breast: negative Hematologic/lymphatic: negative Musculoskeletal:negative Neurological: negative Behavioral/Psych: negative Endocrine: negative Allergic/Immunologic: negative   PHYSICAL EXAMINATION: General appearance: alert, cooperative, fatigued and no distress Head: Normocephalic, without obvious abnormality, atraumatic Neck: no adenopathy, no JVD, supple, symmetrical, trachea midline and thyroid not enlarged, symmetric, no tenderness/mass/nodules Lymph nodes: Cervical, supraclavicular, and axillary nodes normal. Resp: clear to auscultation bilaterally Back: symmetric, no curvature. ROM normal. No CVA tenderness. Cardio: regular rate and rhythm, S1, S2 normal, no murmur, click, rub or gallop GI: soft, non-tender; bowel sounds normal; no masses,  no organomegaly Extremities: extremities normal, atraumatic, no cyanosis or edema Neurologic: Alert and oriented X 3, normal strength and tone. Normal symmetric reflexes. Normal coordination and gait  ECOG PERFORMANCE STATUS: 1 - Symptomatic but completely ambulatory  Blood pressure 122/67, pulse 93, temperature 98 F (36.7 C), temperature source Oral, resp. rate 20, height 6' 2"  (1.88 m), weight 188 lb 3.2 oz (85.4 kg), SpO2 100 %.  LABORATORY DATA: Lab Results  Component Value Date   WBC 3.7 (L) 06/24/2016   HGB 13.9 06/24/2016   HCT 42.9  06/24/2016   MCV 84.0 06/24/2016  PLT 170 06/24/2016      Chemistry      Component Value Date/Time   NA 140 06/05/2016 1046   K 4.7 06/05/2016 1046   CL 107 05/22/2016 0641   CO2 26 06/05/2016 1046   BUN 7.1 06/05/2016 1046   CREATININE 0.8 06/05/2016 1046      Component Value Date/Time   CALCIUM 9.8 06/05/2016 1046   ALKPHOS 100 06/05/2016 1046   AST 25 06/05/2016 1046   ALT 10 06/05/2016 1046   BILITOT 0.56 06/05/2016 1046       RADIOGRAPHIC STUDIES: Mr Jeri Cos JS Contrast  Result Date: 05/29/2016 CLINICAL DATA:  60 y/o M; newly diagnosed lung cancer. Initial encounter. EXAM: MRI HEAD WITHOUT AND WITH CONTRAST TECHNIQUE: Multiplanar, multiecho pulse sequences of the brain and surrounding structures were obtained without and with intravenous contrast. CONTRAST:  55m MULTIHANCE GADOBENATE DIMEGLUMINE 529 MG/ML IV SOLN COMPARISON:  None. FINDINGS: Brain: No diffusion restriction to suggest acute infarct. No abnormal susceptibility hypointensity to indicate intracranial hemorrhage. Mild chronic microvascular ischemic changes. No abnormal enhancement is identified. Extra-axial space: No hydrocephalus. No extra-axial collection is identified. Proximal intracranial flow voids are maintained. Other: No abnormal signal of the paranasal sinuses. No abnormal signal of the mastoid air cells. Orbits are unremarkable. Calvarium is unremarkable. IMPRESSION: No metastatic disease is identified. No acute intracranial abnormality. Mild chronic microvascular ischemic changes. Electronically Signed   By: LKristine GarbeM.D.   On: 05/29/2016 18:52   Nm Pet Image Initial (pi) Skull Base To Thigh  Result Date: 05/29/2016 CLINICAL DATA:  Initial treatment strategy for right upper lobe lung nodule and mediastinal adenopathy. EXAM: NUCLEAR MEDICINE PET SKULL BASE TO THIGH TECHNIQUE: 12.1 mCi F-18 FDG was injected intravenously. Full-ring PET imaging was performed from the skull base to thigh  after the radiotracer. CT data was obtained and used for attenuation correction and anatomic localization. FASTING BLOOD GLUCOSE:  Value: 95 mg/dl COMPARISON:  Chest CT 05/14/2016 FINDINGS: NECK No hypermetabolic lymph nodes in the neck. Hypermetabolism is noted in the left vocal cord. The right vocal cord is likely paralyzed. It is centralized and thickened in appearance. The right recurrent laryngeal nerve is likely effecting but the right mediastinal mass. CHEST The right upper lobe pulmonary nodule is hypermetabolic. SUV max is 13. Bulky mediastinal and right hilar lymphadenopathy is also markedly hypermetabolic with SUV max of 197.0 There are also small hypermetabolic anterior mediastinal nodes and a contralateral left hilar lymph node which has an SUV max of 4.3. No other lung lesions are identified. ABDOMEN/PELVIS No abnormal hypermetabolic activity within the liver, pancreas, adrenal glands, or spleen. No hypermetabolic lymph nodes in the abdomen or pelvis. Anal rectal hypermetabolism is likely due to hemorrhoids. SKELETON Hypermetabolic bone lesions are noted in the right iliac bone and L5 vertebral body. Suspect small bilateral rib lesions also. IMPRESSION: 1. Hypermetabolic spiculated right apical lung lesion consistent with neoplasm. There is associated bulky hypermetabolic mediastinal and right hilar lymphadenopathy. 2. Hypermetabolic contralateral left hilar lymph node. 3. Osseous metastatic disease. 4. 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 Electronically Signed   By: PMarijo SanesM.D.   On: 05/29/2016 16:51    ASSESSMENT AND PLAN: This is a very pleasant 60years old white male recently diagnosed with a stage IV non-small cell lung cancer, adenocarcinoma presented with large apical lung mass as well as bulky just final and right hilar adenopathy in addition to contralateral left hilar lymph nodes  and osseous metastatic disease.  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 The patient has no actionable mutations. He is currently undergoing palliative radiotherapy to the large right apical lung mass and mediastinal lymphadenopathy seen on under the care of Dr. Lisbeth Renshaw. I discussed with him treatment options including palliative care and hospice referral versus consideration of systemic chemotherapy with carboplatin for AUC of 5, Alimta 500 MG/M2 and Avastin 15 MG/KG every 3 weeks according to the [facility].  The patient is interested in proceeding with systemic chemotherapy. I discussed with the patient adverse effects of this treatment including but not limited to alopecia, myelosuppression, nausea and vomiting, peripheral neuropathy, liver or renal dysfunction in addition to the other adverse effects from Avastin including pulmonary hemorrhage, GI perforation and wound healing delay as well as hypertension and proteinuria. We will arrange for the patient to receive vitamin B 12 injection today. The patient would also receive prescription for Compazine 10 mg by mouth every 6 hours as needed for nausea, Decadron 4 mg by mouth twice a day, the day before, day of and day after the chemotherapy in addition to folic acid 1 mg by mouth daily. I will arrange for the patient to have a chemotherapy education class before starting the first dose of the chemotherapy. He is expected to start the first dose of this treatment on 07/01/2016. He would come back for follow-up visit in 2 weeks for evaluation and management of any adverse effect of his treatment. He was advised to call immediately if he has any concerning symptoms in the interval. The patient voices understanding of current disease status and treatment options and is in agreement with the current care plan.  All questions were answered. The patient knows to call the clinic with any problems, questions or concerns. We can certainly see  the patient much sooner if necessary.  Disclaimer: This note was dictated with voice recognition software. Similar sounding words can inadvertently be transcribed and may not be corrected upon review.

## 2016-06-25 ENCOUNTER — Ambulatory Visit
Admission: RE | Admit: 2016-06-25 | Discharge: 2016-06-25 | Disposition: A | Discharge status: Home / Self Care | Payer: Medicaid Other | Source: Ambulatory Visit | Attending: Radiation Oncology | Admitting: Radiation Oncology

## 2016-06-25 DIAGNOSIS — Z510 Encounter for antineoplastic radiation therapy: Secondary | ICD-10-CM | POA: Diagnosis not present

## 2016-06-26 ENCOUNTER — Ambulatory Visit
Admission: RE | Admit: 2016-06-26 | Discharge: 2016-06-26 | Disposition: A | Discharge status: Home / Self Care | Payer: Medicaid Other | Source: Ambulatory Visit | Attending: Radiation Oncology | Admitting: Radiation Oncology

## 2016-06-26 DIAGNOSIS — Z510 Encounter for antineoplastic radiation therapy: Secondary | ICD-10-CM | POA: Diagnosis not present

## 2016-06-27 ENCOUNTER — Encounter: Payer: Self-pay | Admitting: Radiation Oncology

## 2016-06-27 ENCOUNTER — Inpatient Hospital Stay
Admission: RE | Admit: 2016-06-27 | Discharge: 2016-06-27 | Disposition: A | Discharge status: Home / Self Care | Payer: Self-pay | Source: Ambulatory Visit | Attending: Radiation Oncology | Admitting: Radiation Oncology

## 2016-06-27 ENCOUNTER — Telehealth: Payer: Self-pay | Admitting: Internal Medicine

## 2016-06-27 ENCOUNTER — Ambulatory Visit
Admission: RE | Admit: 2016-06-27 | Discharge: 2016-06-27 | Disposition: A | Discharge status: Home / Self Care | Payer: Medicaid Other | Source: Ambulatory Visit | Attending: Radiation Oncology | Admitting: Radiation Oncology

## 2016-06-27 ENCOUNTER — Ambulatory Visit: Payer: Medicaid Other

## 2016-06-27 VITALS — BP 108/60 | HR 93 | Temp 98.5°F | Resp 20 | Wt 189.2 lb

## 2016-06-27 DIAGNOSIS — C3411 Malignant neoplasm of upper lobe, right bronchus or lung: Secondary | ICD-10-CM

## 2016-06-27 DIAGNOSIS — Z510 Encounter for antineoplastic radiation therapy: Secondary | ICD-10-CM | POA: Diagnosis not present

## 2016-06-27 DIAGNOSIS — C7951 Secondary malignant neoplasm of bone: Secondary | ICD-10-CM

## 2016-06-27 NOTE — Progress Notes (Addendum)
Weekly rad txs right lung right pelvis 14/15 completed,  No skin changes, coughs up clear to slightly yellow phlegm, hoarseness is better, appetite good,  Stopped smoking 1 month ago, eating softer foods, taste buds off, some foods smells gag him, energy level down a little , 08/19/16 folow up with [name], PA 2:40 PM BP 108/60 (BP Location: Left Arm, Patient Position: Sitting, Cuff Size: Normal)   Pulse 93   Temp 98.5 F (36.9 C) (Oral)   Resp 20   Wt 189 lb 3.2 oz (85.8 kg)   SpO2 100% Comment: room a ir  BMI 24.29 kg/m   Wt Readings from Last 3 Encounters:  06/27/16 189 lb 3.2 oz (85.8 kg)  06/24/16 188 lb 3.2 oz (85.4 kg)  06/13/16 193 lb 4.8 oz (87.7 kg)

## 2016-06-27 NOTE — Telephone Encounter (Signed)
PATIENT STOPPED BY FOR APPOINTMENTS. APPOINTMENTS SCHEDULED PER 9/26 LOS AND PATIENT GIVEN AVS REPORT AND APPOINTMENTS FOR October AND November. INFUSION AREA AT [facility] 10/3 - CHEMO SCHEDULED FOR 10/4.   ALTHOUGH PATIENT STILL HAS [place] APPOINTMENTS ON SCHEDULE - PER PATIENT HE IS THROUGH WITH RADIATION.

## 2016-06-29 NOTE — Progress Notes (Signed)
   Department of Radiation Oncology  Phone:  [phone] Fax:        [phone]  Weekly Treatment Note    Name: Edward Spence Date: 06/29/2016 MRN: [id number] DOB: 1955/10/24   Diagnosis:     ICD-9-CM ICD-10-CM   1. Malignant neoplasm of bronchus of right upper lobe (HCC) 162.3 C34.11   2. Osseous metastasis (HCC) 198.5 C79.51      Current dose: 35 Gy (right lung)  Current fraction: 14   MEDICATIONS: Current Outpatient Prescriptions  Medication Sig Dispense Refill  . dexamethasone (DECADRON) 4 MG tablet 4 mg by mouth twice a day the day before, day of and day after the chemotherapy every 3 weeks 40 tablet 1  . folic acid (FOLVITE) 1 MG tablet Take 1 tablet (1 mg total) by mouth daily. 30 tablet 4  . prochlorperazine (COMPAZINE) 10 MG tablet Take 1 tablet (10 mg total) by mouth every 6 (six) hours as needed for nausea or vomiting. 30 tablet 0  . Wound Dressings (SONAFINE) Apply 1 application topically daily. Apply sonafine to chest area after rad tx daily  and prn     No current facility-administered medications for this encounter.      ALLERGIES: Penicillins   LABORATORY DATA:  Lab Results  Component Value Date   WBC 3.7 (L) 06/24/2016   HGB 13.9 06/24/2016   HCT 42.9 06/24/2016   MCV 84.0 06/24/2016   PLT 170 06/24/2016   Lab Results  Component Value Date   NA 141 06/24/2016   K 4.4 06/24/2016   CL 107 05/22/2016   CO2 26 06/24/2016   Lab Results  Component Value Date   ALT 10 06/24/2016   AST 23 06/24/2016   ALKPHOS 76 06/24/2016   BILITOT 0.49 06/24/2016     NARRATIVE: Edward Spence was seen today for weekly treatment management. The chart was checked and the patient's films were reviewed.  Weekly rad txs right lung right pelvis 14/15 completed,  No skin changes, coughs up clear to slightly yellow phlegm, hoarseness is better, appetite good,  Stopped smoking 1 month ago, eating softer foods, taste buds off, some foods smells gag him,  energy level down a little , 08/19/16 folow up with Edward Simpson, PA 9:32 PM BP 108/60 (BP Location: Left Arm, Patient Position: Sitting, Cuff Size: Normal)   Pulse 93   Temp 98.5 F (36.9 C) (Oral)   Resp 20   Wt 189 lb 3.2 oz (85.8 kg)   SpO2 100% Comment: room a ir  BMI 24.29 kg/m   Wt Readings from Last 3 Encounters:  06/27/16 189 lb 3.2 oz (85.8 kg)  06/24/16 188 lb 3.2 oz (85.4 kg)  06/13/16 193 lb 4.8 oz (87.7 kg)    PHYSICAL EXAMINATION: weight is 189 lb 3.2 oz (85.8 kg). His oral temperature is 98.5 F (36.9 C). His blood pressure is 108/60 and his pulse is 93. His respiration is 20 and oxygen saturation is 100%.        ASSESSMENT: The patient is doing satisfactorily with treatment.  PLAN: We will continue with the patient's radiation treatment as planned. The patient has done quite well with treatment and will finish after one more fraction we will have him return to clinic in 1 month after completing his course of radiation treatment as

## 2016-06-30 ENCOUNTER — Ambulatory Visit
Admission: RE | Admit: 2016-06-30 | Discharge: 2016-06-30 | Disposition: A | Discharge status: Home / Self Care | Payer: Medicaid Other | Source: Ambulatory Visit | Attending: Radiation Oncology | Admitting: Radiation Oncology

## 2016-06-30 ENCOUNTER — Encounter: Payer: Self-pay | Admitting: Radiation Oncology

## 2016-06-30 ENCOUNTER — Ambulatory Visit: Payer: Medicaid Other

## 2016-06-30 DIAGNOSIS — Z510 Encounter for antineoplastic radiation therapy: Secondary | ICD-10-CM | POA: Diagnosis not present

## 2016-07-01 ENCOUNTER — Ambulatory Visit: Payer: Medicaid Other

## 2016-07-01 ENCOUNTER — Other Ambulatory Visit: Payer: Self-pay | Admitting: *Deleted

## 2016-07-01 ENCOUNTER — Other Ambulatory Visit: Payer: Self-pay

## 2016-07-01 ENCOUNTER — Encounter: Payer: Self-pay | Admitting: *Deleted

## 2016-07-02 ENCOUNTER — Other Ambulatory Visit (HOSPITAL_BASED_OUTPATIENT_CLINIC_OR_DEPARTMENT_OTHER): Payer: Medicaid Other

## 2016-07-02 ENCOUNTER — Ambulatory Visit: Payer: Medicaid Other

## 2016-07-02 ENCOUNTER — Ambulatory Visit (HOSPITAL_BASED_OUTPATIENT_CLINIC_OR_DEPARTMENT_OTHER): Payer: Medicaid Other

## 2016-07-02 VITALS — BP 115/67 | HR 98 | Temp 98.7°F | Resp 20

## 2016-07-02 DIAGNOSIS — Z5112 Encounter for antineoplastic immunotherapy: Secondary | ICD-10-CM

## 2016-07-02 DIAGNOSIS — C3411 Malignant neoplasm of upper lobe, right bronchus or lung: Secondary | ICD-10-CM

## 2016-07-02 DIAGNOSIS — Z5111 Encounter for antineoplastic chemotherapy: Secondary | ICD-10-CM | POA: Diagnosis not present

## 2016-07-02 LAB — CBC WITH DIFFERENTIAL/PLATELET
BASO%: 0 % (ref 0.0–2.0)
BASOS ABS: 0 10*3/uL (ref 0.0–0.1)
EOS ABS: 0 10*3/uL (ref 0.0–0.5)
EOS%: 0 % (ref 0.0–7.0)
HCT: 40.3 % (ref 38.4–49.9)
HGB: 13.2 g/dL (ref 13.0–17.1)
LYMPH%: 4.8 % — AB (ref 14.0–49.0)
MCH: 27.4 pg (ref 27.2–33.4)
MCHC: 32.8 g/dL (ref 32.0–36.0)
MCV: 83.6 fL (ref 79.3–98.0)
MONO#: 0.3 10*3/uL (ref 0.1–0.9)
MONO%: 7.2 % (ref 0.0–14.0)
NEUT#: 4.1 10*3/uL (ref 1.5–6.5)
NEUT%: 88 % — AB (ref 39.0–75.0)
Platelets: 161 10*3/uL (ref 140–400)
RBC: 4.82 10*6/uL (ref 4.20–5.82)
RDW: 16.1 % — ABNORMAL HIGH (ref 11.0–14.6)
WBC: 4.6 10*3/uL (ref 4.0–10.3)
lymph#: 0.2 10*3/uL — ABNORMAL LOW (ref 0.9–3.3)

## 2016-07-02 LAB — COMPREHENSIVE METABOLIC PANEL
ALBUMIN: 3.3 g/dL — AB (ref 3.5–5.0)
ALK PHOS: 66 U/L (ref 40–150)
ALT: <9 U/L (ref 0–55)
AST: 14 U/L (ref 5–34)
Anion Gap: 10 mEq/L (ref 3–11)
BUN: 11.1 mg/dL (ref 7.0–26.0)
CO2: 23 meq/L (ref 22–29)
Calcium: 9.8 mg/dL (ref 8.4–10.4)
Chloride: 107 mEq/L (ref 98–109)
Creatinine: 0.8 mg/dL (ref 0.7–1.3)
GLUCOSE: 128 mg/dL (ref 70–140)
POTASSIUM: 3.9 meq/L (ref 3.5–5.1)
SODIUM: 141 meq/L (ref 136–145)
Total Bilirubin: 0.31 mg/dL (ref 0.20–1.20)
Total Protein: 7.3 g/dL (ref 6.4–8.3)

## 2016-07-02 LAB — UA PROTEIN, DIPSTICK - CHCC: Protein, ur: <30 mg/dL

## 2016-07-02 MED ORDER — SODIUM CHLORIDE 0.9 % IV SOLN
703.0000 mg | Freq: Once | INTRAVENOUS | Status: AC
  Administered 2016-07-02: 700 mg via INTRAVENOUS
  Filled 2016-07-02: qty 70

## 2016-07-02 MED ORDER — PALONOSETRON HCL INJECTION 0.25 MG/5ML
0.2500 mg | Freq: Once | INTRAVENOUS | Status: AC
  Administered 2016-07-02: 0.25 mg via INTRAVENOUS

## 2016-07-02 MED ORDER — SODIUM CHLORIDE 0.9 % IV SOLN
14.0000 mg/kg | Freq: Once | INTRAVENOUS | Status: AC
  Administered 2016-07-02: 1200 mg via INTRAVENOUS
  Filled 2016-07-02: qty 48

## 2016-07-02 MED ORDER — SODIUM CHLORIDE 0.9 % IV SOLN
10.0000 mg | Freq: Once | INTRAVENOUS | Status: AC
  Administered 2016-07-02: 10 mg via INTRAVENOUS
  Filled 2016-07-02: qty 1

## 2016-07-02 MED ORDER — SODIUM CHLORIDE 0.9 % IV SOLN
Freq: Once | INTRAVENOUS | Status: AC
  Administered 2016-07-02: 13:00:00 via INTRAVENOUS

## 2016-07-02 MED ORDER — PALONOSETRON HCL INJECTION 0.25 MG/5ML
INTRAVENOUS | Status: AC
  Filled 2016-07-02: qty 5

## 2016-07-02 MED ORDER — SODIUM CHLORIDE 0.9 % IV SOLN
470.0000 mg/m2 | Freq: Once | INTRAVENOUS | Status: AC
  Administered 2016-07-02: 1000 mg via INTRAVENOUS
  Filled 2016-07-02: qty 40

## 2016-07-02 NOTE — Progress Notes (Signed)
Patient confirms no surgeries in the past 7-10 days.

## 2016-07-02 NOTE — Progress Notes (Signed)
Patient declined flu vaccine, does not want to get.

## 2016-07-02 NOTE — Patient Instructions (Signed)
[facility] Discharge Instructions for Patients Receiving Chemotherapy  Today you received the following chemotherapy agents Avastin, Alimta, and Carboplatin  To help prevent nausea and vomiting after your treatment, we encourage you to take your nausea medication as directed.   If you develop nausea and vomiting that is not controlled by your nausea medication, call the clinic.   BELOW ARE SYMPTOMS THAT SHOULD BE REPORTED IMMEDIATELY:  *FEVER GREATER THAN 100.5 F  *CHILLS WITH OR WITHOUT FEVER  NAUSEA AND VOMITING THAT IS NOT CONTROLLED WITH YOUR NAUSEA MEDICATION  *UNUSUAL SHORTNESS OF BREATH  *UNUSUAL BRUISING OR BLEEDING  TENDERNESS IN MOUTH AND THROAT WITH OR WITHOUT PRESENCE OF ULCERS  *URINARY PROBLEMS  *BOWEL PROBLEMS  UNUSUAL RASH Items with * indicate a potential emergency and should be followed up as soon as possible.  Feel free to call the clinic you have any questions or concerns. The clinic phone number is [phone].  Please show the [facility] at check-in to the Emergency Department and triage nurse.    Bevacizumab injection What is this medicine? BEVACIZUMAB (be va SIZ yoo mab) is a monoclonal antibody. It is used to treat cervical cancer, colorectal cancer, glioblastoma multiforme, non-small cell lung cancer (NSCLC), ovarian cancer, and renal cell cancer. This medicine may be used for other purposes; ask your health care provider or pharmacist if you have questions. What should I tell my health care provider before I take this medicine? They need to know if you have any of these conditions: -blood clots -heart disease, including heart failure, heart attack, or chest pain (angina) -high blood pressure -infection (especially a virus infection such as chickenpox, cold sores, or herpes) -kidney disease -lung disease -prior chemotherapy with doxorubicin, daunorubicin, epirubicin, or other anthracycline type chemotherapy  agents -recent or ongoing radiation therapy -recent surgery -stroke -an unusual or allergic reaction to bevacizumab, hamster proteins, mouse proteins, other medicines, foods, dyes, or preservatives -pregnant or trying to get pregnant -breast-feeding How should I use this medicine? This medicine is for infusion into a vein. It is given by a health care professional in a hospital or clinic setting. Talk to your pediatrician regarding the use of this medicine in children. Special care may be needed. Overdosage: If you think you have taken too much of this medicine contact a poison control center or emergency room at once. NOTE: This medicine is only for you. Do not share this medicine with others. What if I miss a dose? It is important not to miss your dose. Call your doctor or health care professional if you are unable to keep an appointment. What may interact with this medicine? Interactions are not expected. This list may not describe all possible interactions. Give your health care provider a list of all the medicines, herbs, non-prescription drugs, or dietary supplements you use. Also tell them if you smoke, drink alcohol, or use illegal drugs. Some items may interact with your medicine. What should I watch for while using this medicine? Your condition will be monitored carefully while you are receiving this medicine. You will need important blood work and urine testing done while you are taking this medicine. During your treatment, let your health care professional know if you have any unusual symptoms, such as difficulty breathing. This medicine may rarely cause 'gastrointestinal perforation' (holes in the stomach, intestines or colon), a serious side effect requiring surgery to repair. This medicine should be started at least 28 days following major surgery and the site of the  surgery should be totally healed. Check with your doctor before scheduling dental work or surgery while you are  receiving this treatment. Talk to your doctor if you have recently had surgery or if you have a wound that has not healed. Do not become pregnant while taking this medicine or for 6 months after stopping it. Women should inform their doctor if they wish to become pregnant or think they might be pregnant. There is a potential for serious side effects to an unborn child. Talk to your health care professional or pharmacist for more information. Do not breast-feed an infant while taking this medicine. This medicine has caused ovarian failure in some women. This medicine may interfere with the ability to have a child. You should talk to your doctor or health care professional if you are concerned about your fertility. What side effects may I notice from receiving this medicine? Side effects that you should report to your doctor or health care professional as soon as possible: -allergic reactions like skin rash, itching or hives, swelling of the face, lips, or tongue -signs of infection - fever or chills, cough, sore throat, pain or trouble passing urine -signs of decreased platelets or bleeding - bruising, pinpoint red spots on the skin, black, tarry stools, nosebleeds, blood in the urine -breathing problems -changes in vision -chest pain -confusion -jaw pain, especially after dental work -mouth sores -seizures -severe abdominal pain -severe headache -sudden numbness or weakness of the face, arm or leg -swelling of legs or ankles -symptoms of a stroke: change in mental awareness, inability to talk or move one side of the body (especially in patients with lung cancer) -trouble passing urine or change in the amount of urine -trouble speaking or understanding -trouble walking, dizziness, loss of balance or coordination Side effects that usually do not require medical attention (report to your doctor or health care professional if they continue or are bothersome): -constipation -diarrhea -dry  skin -headache -loss of appetite -nausea, vomiting This list may not describe all possible side effects. Call your doctor for medical advice about side effects. You may report side effects to FDA at 1-800-FDA-1088. Where should I keep my medicine? This drug is given in a hospital or clinic and will not be stored at home. NOTE: This sheet is a summary. It may not cover all possible information. If you have questions about this medicine, talk to your doctor, pharmacist, or health care provider.    2016, Elsevier/Gold Standard. (2014-11-14 16:58:44)  Pemetrexed injection What is this medicine? PEMETREXED (PEM e TREX ed) is a chemotherapy drug. This medicine affects cells that are rapidly growing, such as cancer cells and cells in your mouth and stomach. It is usually used to treat lung cancers like non-small cell lung cancer and mesothelioma. It may also be used to treat other cancers. This medicine may be used for other purposes; ask your health care provider or pharmacist if you have questions. What should I tell my health care provider before I take this medicine? They need to know if you have any of these conditions: -if you frequently drink alcohol containing beverages -infection (especially a virus infection such as chickenpox, cold sores, or herpes) -kidney disease -liver disease -low blood counts, like low platelets, red bloods, or white blood cells -an unusual or allergic reaction to pemetrexed, mannitol, other medicines, foods, dyes, or preservatives -pregnant or trying to get pregnant -breast-feeding How should I use this medicine? This drug is given as an infusion into a vein. It  is administered in a hospital or clinic by a specially trained health care professional. Talk to your pediatrician regarding the use of this medicine in children. Special care may be needed. Overdosage: If you think you have taken too much of this medicine contact a poison control center or emergency  room at once. NOTE: This medicine is only for you. Do not share this medicine with others. What if I miss a dose? It is important not to miss your dose. Call your doctor or health care professional if you are unable to keep an appointment. What may interact with this medicine? -aspirin and aspirin-like medicines -medicines to increase blood counts like filgrastim, pegfilgrastim, sargramostim -methotrexate -NSAIDS, medicines for pain and inflammation, like ibuprofen or naproxen -probenecid -pyrimethamine -vaccines Talk to your doctor or health care professional before taking any of these medicines: -acetaminophen -aspirin -ibuprofen -ketoprofen -naproxen This list may not describe all possible interactions. Give your health care provider a list of all the medicines, herbs, non-prescription drugs, or dietary supplements you use. Also tell them if you smoke, drink alcohol, or use illegal drugs. Some items may interact with your medicine. What should I watch for while using this medicine? Visit your doctor for checks on your progress. This drug may make you feel generally unwell. This is not uncommon, as chemotherapy can affect healthy cells as well as cancer cells. Report any side effects. Continue your course of treatment even though you feel ill unless your doctor tells you to stop. In some cases, you may be given additional medicines to help with side effects. Follow all directions for their use. Call your doctor or health care professional for advice if you get a fever, chills or sore throat, or other symptoms of a cold or flu. Do not treat yourself. This drug decreases your body's ability to fight infections. Try to avoid being around people who are sick. This medicine may increase your risk to bruise or bleed. Call your doctor or health care professional if you notice any unusual bleeding. Be careful brushing and flossing your teeth or using a toothpick because you may get an infection or  bleed more easily. If you have any dental work done, tell your dentist you are receiving this medicine. Avoid taking products that contain aspirin, acetaminophen, ibuprofen, naproxen, or ketoprofen unless instructed by your doctor. These medicines may hide a fever. Call your doctor or health care professional if you get diarrhea or mouth sores. Do not treat yourself. To protect your kidneys, drink water or other fluids as directed while you are taking this medicine. Men and women must use effective birth control while taking this medicine. You may also need to continue using effective birth control for a time after stopping this medicine. Do not become pregnant while taking this medicine. Tell your doctor right away if you think that you or your partner might be pregnant. There is a potential for serious side effects to an unborn child. Talk to your health care professional or pharmacist for more information. Do not breast-feed an infant while taking this medicine. This medicine may lower sperm counts. What side effects may I notice from receiving this medicine? Side effects that you should report to your doctor or health care professional as soon as possible: -allergic reactions like skin rash, itching or hives, swelling of the face, lips, or tongue -low blood counts - this medicine may decrease the number of white blood cells, red blood cells and platelets. You may be at increased risk for  infections and bleeding. -signs of infection - fever or chills, cough, sore throat, pain or difficulty passing urine -signs of decreased platelets or bleeding - bruising, pinpoint red spots on the skin, black, tarry stools, blood in the urine -signs of decreased red blood cells - unusually weak or tired, fainting spells, lightheadedness -breathing problems, like a dry cough -changes in emotions or moods -chest pain -confusion -diarrhea -high blood pressure -mouth or throat sores or ulcers -pain, swelling,  warmth in the leg -pain on swallowing -swelling of the ankles, feet, hands -trouble passing urine or change in the amount of urine -vomiting -yellowing of the eyes or skin Side effects that usually do not require medical attention (report to your doctor or health care professional if they continue or are bothersome): -hair loss -loss of appetite -nausea -stomach upset This list may not describe all possible side effects. Call your doctor for medical advice about side effects. You may report side effects to FDA at 1-800-FDA-1088. Where should I keep my medicine? This drug is given in a hospital or clinic and will not be stored at home. NOTE: This sheet is a summary. It may not cover all possible information. If you have questions about this medicine, talk to your doctor, pharmacist, or health care provider.    2016, Elsevier/Gold Standard. (2008-04-18 13:24:03)    Carboplatin injection What is this medicine? CARBOPLATIN (KAR boe pla tin) is a chemotherapy drug. It targets fast dividing cells, like cancer cells, and causes these cells to die. This medicine is used to treat ovarian cancer and many other cancers. This medicine may be used for other purposes; ask your health care provider or pharmacist if you have questions. What should I tell my health care provider before I take this medicine? They need to know if you have any of these conditions: -blood disorders -hearing problems -kidney disease -recent or ongoing radiation therapy -an unusual or allergic reaction to carboplatin, cisplatin, other chemotherapy, other medicines, foods, dyes, or preservatives -pregnant or trying to get pregnant -breast-feeding How should I use this medicine? This drug is usually given as an infusion into a vein. It is administered in a hospital or clinic by a specially trained health care professional. Talk to your pediatrician regarding the use of this medicine in children. Special care may be  needed. Overdosage: If you think you have taken too much of this medicine contact a poison control center or emergency room at once. NOTE: This medicine is only for you. Do not share this medicine with others. What if I miss a dose? It is important not to miss a dose. Call your doctor or health care professional if you are unable to keep an appointment. What may interact with this medicine? -medicines for seizures -medicines to increase blood counts like filgrastim, pegfilgrastim, sargramostim -some antibiotics like amikacin, gentamicin, neomycin, streptomycin, tobramycin -vaccines Talk to your doctor or health care professional before taking any of these medicines: -acetaminophen -aspirin -ibuprofen -ketoprofen -naproxen This list may not describe all possible interactions. Give your health care provider a list of all the medicines, herbs, non-prescription drugs, or dietary supplements you use. Also tell them if you smoke, drink alcohol, or use illegal drugs. Some items may interact with your medicine. What should I watch for while using this medicine? Your condition will be monitored carefully while you are receiving this medicine. You will need important blood work done while you are taking this medicine. This drug may make you feel generally unwell. This is not  uncommon, as chemotherapy can affect healthy cells as well as cancer cells. Report any side effects. Continue your course of treatment even though you feel ill unless your doctor tells you to stop. In some cases, you may be given additional medicines to help with side effects. Follow all directions for their use. Call your doctor or health care professional for advice if you get a fever, chills or sore throat, or other symptoms of a cold or flu. Do not treat yourself. This drug decreases your body's ability to fight infections. Try to avoid being around people who are sick. This medicine may increase your risk to bruise or bleed.  Call your doctor or health care professional if you notice any unusual bleeding. Be careful brushing and flossing your teeth or using a toothpick because you may get an infection or bleed more easily. If you have any dental work done, tell your dentist you are receiving this medicine. Avoid taking products that contain aspirin, acetaminophen, ibuprofen, naproxen, or ketoprofen unless instructed by your doctor. These medicines may hide a fever. Do not become pregnant while taking this medicine. Women should inform their doctor if they wish to become pregnant or think they might be pregnant. There is a potential for serious side effects to an unborn child. Talk to your health care professional or pharmacist for more information. Do not breast-feed an infant while taking this medicine. What side effects may I notice from receiving this medicine? Side effects that you should report to your doctor or health care professional as soon as possible: -allergic reactions like skin rash, itching or hives, swelling of the face, lips, or tongue -signs of infection - fever or chills, cough, sore throat, pain or difficulty passing urine -signs of decreased platelets or bleeding - bruising, pinpoint red spots on the skin, black, tarry stools, nosebleeds -signs of decreased red blood cells - unusually weak or tired, fainting spells, lightheadedness -breathing problems -changes in hearing -changes in vision -chest pain -high blood pressure -low blood counts - This drug may decrease the number of white blood cells, red blood cells and platelets. You may be at increased risk for infections and bleeding. -nausea and vomiting -pain, swelling, redness or irritation at the injection site -pain, tingling, numbness in the hands or feet -problems with balance, talking, walking -trouble passing urine or change in the amount of urine Side effects that usually do not require medical attention (report to your doctor or health  care professional if they continue or are bothersome): -hair loss -loss of appetite -metallic taste in the mouth or changes in taste This list may not describe all possible side effects. Call your doctor for medical advice about side effects. You may report side effects to FDA at 1-800-FDA-1088. Where should I keep my medicine? This drug is given in a hospital or clinic and will not be stored at home. NOTE: This sheet is a summary. It may not cover all possible information. If you have questions about this medicine, talk to your doctor, pharmacist, or health care provider.    2016, Elsevier/Gold Standard. (2007-12-21 14:38:05)

## 2016-07-03 ENCOUNTER — Ambulatory Visit: Payer: Medicaid Other

## 2016-07-04 ENCOUNTER — Ambulatory Visit: Payer: Medicaid Other

## 2016-07-07 ENCOUNTER — Ambulatory Visit: Payer: Medicaid Other

## 2016-07-08 ENCOUNTER — Ambulatory Visit: Payer: Medicaid Other

## 2016-07-08 NOTE — Progress Notes (Signed)
  Radiation Oncology         [phone] ________________________________  Name: Edward Spence MRN: [id number]  Date: 06/30/2016  DOB: October 20, 1955  End of Treatment Note  Diagnosis:   Metastatic lung cancer with bony metastasis  Indication for treatment::  palliative       Radiation treatment dates:   06/09/2016 through 06/30/2016  Site/dose:   The patient was treated to the right lung in a palliative manner to a dose of 37.5 gray in 15 fractions. The patient also received 30 gray in 10 fractions to the right pelvis. The right lung was treated with a three-field technique in the right pelvis was treated using a 4 field technique.  Narrative: The patient tolerated radiation treatment relatively well.   The patient did not exhibit any substantial difficulties in terms of acute toxicity during treatment.  Plan: The patient has completed radiation treatment. The patient will return to radiation oncology clinic for routine followup in one month. I advised the patient to call or return sooner if they have any questions or concerns related to their recovery or treatment. ________________________________  Jodelle Gross, M.D., Ph.D.

## 2016-07-09 ENCOUNTER — Ambulatory Visit: Payer: Medicaid Other

## 2016-07-09 ENCOUNTER — Telehealth: Payer: Self-pay | Admitting: Medical Oncology

## 2016-07-09 ENCOUNTER — Telehealth: Payer: Self-pay | Admitting: Internal Medicine

## 2016-07-09 NOTE — Telephone Encounter (Signed)
DUE TO MM PAL MOVED 10/12 F/U TO 10/19 WITH [facility].  PER [facility] PATIENT TO KEEP LAB SCHEDULED FOR 10/12 AND MOVE F/U TO NEXT WEEK. NO AVAILABILITY W/MM AND PATIENT MOVED TO [place] 10/19. [facility]   CALLED PATIENT AND SPOKE WITH ANNA (EX WIFE) RE APPOINTMENT CHANGES AND NEW DATE/TIMES FOR 10/12 LAB AND 10/19 LAB/[facility] (PATIENT SLEEPING)  ANNA TRANSFERRED TO DESK NURSE RE CONCERNS ABOUT  SIDE EFFECT/SYMPTOMS.

## 2016-07-09 NOTE — Telephone Encounter (Signed)
Ex wife called about foods to eat. He had a difficult time swallowing- food got stuck and he throws it back up. I called pt and admits to these problems for monday and Tuesday but better today.  Today has been a good day , ate cream of wheat , baked potato, milk , He will try to eat a burrito tonight because he is craving it. He does not know why his radiation was stopped. Note to radiation.

## 2016-07-10 ENCOUNTER — Telehealth: Payer: Self-pay | Admitting: Medical Oncology

## 2016-07-10 ENCOUNTER — Other Ambulatory Visit (HOSPITAL_BASED_OUTPATIENT_CLINIC_OR_DEPARTMENT_OTHER): Payer: Medicaid Other

## 2016-07-10 ENCOUNTER — Encounter: Payer: Self-pay | Admitting: Nurse Practitioner

## 2016-07-10 ENCOUNTER — Ambulatory Visit (HOSPITAL_BASED_OUTPATIENT_CLINIC_OR_DEPARTMENT_OTHER): Payer: Medicaid Other | Admitting: Nurse Practitioner

## 2016-07-10 ENCOUNTER — Ambulatory Visit: Payer: Self-pay | Admitting: Internal Medicine

## 2016-07-10 ENCOUNTER — Telehealth: Payer: Self-pay | Admitting: Nurse Practitioner

## 2016-07-10 ENCOUNTER — Ambulatory Visit: Payer: Medicaid Other

## 2016-07-10 VITALS — BP 117/66 | HR 87 | Temp 98.4°F | Resp 18 | Ht 74.0 in | Wt 182.9 lb

## 2016-07-10 DIAGNOSIS — E860 Dehydration: Secondary | ICD-10-CM | POA: Diagnosis not present

## 2016-07-10 DIAGNOSIS — R110 Nausea: Secondary | ICD-10-CM

## 2016-07-10 DIAGNOSIS — R21 Rash and other nonspecific skin eruption: Secondary | ICD-10-CM | POA: Diagnosis not present

## 2016-07-10 DIAGNOSIS — C7951 Secondary malignant neoplasm of bone: Secondary | ICD-10-CM | POA: Diagnosis not present

## 2016-07-10 DIAGNOSIS — R1313 Dysphagia, pharyngeal phase: Secondary | ICD-10-CM

## 2016-07-10 DIAGNOSIS — C3411 Malignant neoplasm of upper lobe, right bronchus or lung: Secondary | ICD-10-CM | POA: Diagnosis not present

## 2016-07-10 DIAGNOSIS — D61810 Antineoplastic chemotherapy induced pancytopenia: Secondary | ICD-10-CM

## 2016-07-10 LAB — COMPREHENSIVE METABOLIC PANEL
ALBUMIN: 3.4 g/dL — AB (ref 3.5–5.0)
ALK PHOS: 68 U/L (ref 40–150)
ALT: 14 U/L (ref 0–55)
ANION GAP: 8 meq/L (ref 3–11)
AST: 22 U/L (ref 5–34)
BUN: 13 mg/dL (ref 7.0–26.0)
CALCIUM: 9.7 mg/dL (ref 8.4–10.4)
CHLORIDE: 102 meq/L (ref 98–109)
CO2: 27 mEq/L (ref 22–29)
Creatinine: 0.8 mg/dL (ref 0.7–1.3)
Glucose: 89 mg/dl (ref 70–140)
POTASSIUM: 4.8 meq/L (ref 3.5–5.1)
Sodium: 138 mEq/L (ref 136–145)
Total Bilirubin: 0.52 mg/dL (ref 0.20–1.20)
Total Protein: 7.3 g/dL (ref 6.4–8.3)

## 2016-07-10 LAB — CBC WITH DIFFERENTIAL/PLATELET
BASO%: 1.1 % (ref 0.0–2.0)
BASOS ABS: 0 10*3/uL (ref 0.0–0.1)
EOS%: 3.4 % (ref 0.0–7.0)
Eosinophils Absolute: 0 10*3/uL (ref 0.0–0.5)
HEMATOCRIT: 39.4 % (ref 38.4–49.9)
HEMOGLOBIN: 13.3 g/dL (ref 13.0–17.1)
LYMPH#: 0.2 10*3/uL — AB (ref 0.9–3.3)
LYMPH%: 23 % (ref 14.0–49.0)
MCH: 28.1 pg (ref 27.2–33.4)
MCHC: 33.8 g/dL (ref 32.0–36.0)
MCV: 83.3 fL (ref 79.3–98.0)
MONO#: 0.1 10*3/uL (ref 0.1–0.9)
MONO%: 9.2 % (ref 0.0–14.0)
NEUT#: 0.6 10*3/uL — ABNORMAL LOW (ref 1.5–6.5)
NEUT%: 63.3 % (ref 39.0–75.0)
Platelets: 86 10*3/uL — ABNORMAL LOW (ref 140–400)
RBC: 4.73 10*6/uL (ref 4.20–5.82)
RDW: 15.6 % — ABNORMAL HIGH (ref 11.0–14.6)
WBC: 0.9 10*3/uL — CL (ref 4.0–10.3)
nRBC: 0 % (ref 0–0)

## 2016-07-10 MED ORDER — ONDANSETRON 8 MG PO TBDP: 8.0000 mg | ORAL_TABLET | Freq: Three times a day (TID) | ORAL | 1 refills | Status: DC | PRN

## 2016-07-10 MED ORDER — SODIUM CHLORIDE 0.9 % IV SOLN
8.0000 mg | Freq: Once | INTRAVENOUS | Status: AC
  Administered 2016-07-10: 8 mg via INTRAVENOUS
  Filled 2016-07-10: qty 4

## 2016-07-10 MED ORDER — SODIUM CHLORIDE 0.9 % IV SOLN
Freq: Once | INTRAVENOUS | Status: AC
  Administered 2016-07-10: 12:00:00 via INTRAVENOUS

## 2016-07-10 MED ORDER — OXYCODONE-ACETAMINOPHEN 5-325 MG PO TABS: 1.0000 | ORAL_TABLET | Freq: Four times a day (QID) | ORAL | 0 refills | Status: DC | PRN

## 2016-07-10 MED FILL — ONDANSETRON ODT 8 MG TABLET: 8 | 10 days supply | Qty: 30 | Fill #0

## 2016-07-10 MED FILL — OXYCODONE/APAP 5/325 MG TAB: 5-325 | 5 days supply | Qty: 45 | Fill #0

## 2016-07-10 NOTE — Telephone Encounter (Signed)
No 10/12 los/orders/referrals °

## 2016-07-10 NOTE — Patient Instructions (Signed)
Dehydration, Adult Dehydration is a condition in which you do not have enough fluid or water in your body. It happens when you take in less fluid than you lose. Vital organs such as the kidneys, brain, and heart cannot function without a proper amount of fluids. Any loss of fluids from the body can cause dehydration.  Dehydration can range from mild to severe. This condition should be treated right away to help prevent it from becoming severe. CAUSES  This condition may be caused by:  Vomiting.  Diarrhea.  Excessive sweating, such as when exercising in hot or humid weather.  Not drinking enough fluid during strenuous exercise or during an illness.  Excessive urine output.  Fever.  Certain medicines. RISK FACTORS This condition is more likely to develop in:  People who are taking certain medicines that cause the body to lose excess fluid (diuretics).   People who have a chronic illness, such as diabetes, that may increase urination.  Older adults.   People who live at high altitudes.   People who participate in endurance sports.  SYMPTOMS  Mild Dehydration  Thirst.  Dry lips.  Slightly dry mouth.  Dry, warm skin. Moderate Dehydration  Very dry mouth.   Muscle cramps.   Dark urine and decreased urine production.   Decreased tear production.   Headache.   Light-headedness, especially when you stand up from a sitting position.  Severe Dehydration  Changes in skin.   Cold and clammy skin.   Skin does not spring back quickly when lightly pinched and released.   Changes in body fluids.   Extreme thirst.   No tears.   Not able to sweat when body temperature is high, such as in hot weather.   Minimal urine production.   Changes in vital signs.   Rapid, weak pulse (more than 100 beats per minute when you are sitting still).   Rapid breathing.   Low blood pressure.   Other changes.   Sunken eyes.   Cold hands and feet.    Confusion.  Lethargy and difficulty being awakened.  Fainting (syncope).   Short-term weight loss.   Unconsciousness. DIAGNOSIS  This condition may be diagnosed based on your symptoms. You may also have tests to determine how severe your dehydration is. These tests may include:   Urine tests.   Blood tests.  TREATMENT  Treatment for this condition depends on the severity. Mild or moderate dehydration can often be treated at home. Treatment should be started right away. Do not wait until dehydration becomes severe. Severe dehydration needs to be treated at the hospital. Treatment for Mild Dehydration  Drinking plenty of water to replace the fluid you have lost.   Replacing minerals in your blood (electrolytes) that you may have lost.  Treatment for Moderate Dehydration  Consuming oral rehydration solution (ORS). Treatment for Severe Dehydration  Receiving fluid through an IV tube.   Receiving electrolyte solution through a feeding tube that is passed through your nose and into your stomach (nasogastric tube or NG tube).  Correcting any abnormalities in electrolytes. HOME CARE INSTRUCTIONS   Drink enough fluid to keep your urine clear or pale yellow.   Drink water or fluid slowly by taking small sips. You can also try sucking on ice cubes.  Have food or beverages that contain electrolytes. Examples include bananas and sports drinks.  Take over-the-counter and prescription medicines only as told by your health care provider.   Prepare ORS according to the manufacturer's instructions. Take sips   of ORS every 5 minutes until your urine returns to normal.  If you have vomiting or diarrhea, continue to try to drink water, ORS, or both.   If you have diarrhea, avoid:   Beverages that contain caffeine.   Fruit juice.   Milk.   Carbonated soft drinks.  Do not take salt tablets. This can lead to the condition of having too much sodium in your body  (hypernatremia).  SEEK MEDICAL CARE IF:  You cannot eat or drink without vomiting.  You have had moderate diarrhea during a period of more than 24 hours.  You have a fever. SEEK IMMEDIATE MEDICAL CARE IF:   You have extreme thirst.  You have severe diarrhea.  You have not urinated in 6-8 hours, or you have urinated only a small amount of very dark urine.  You have shriveled skin.  You are dizzy, confused, or both.   This information is not intended to replace advice given to you by your health care provider. Make sure you discuss any questions you have with your health care provider.   Document Released: 09/15/2005 Document Revised: 06/06/2015 Document Reviewed: 01/31/2015 Elsevier Interactive Patient Education 2016 [employer].   Neutropenia Neutropenia is a condition that occurs when the level of a certain type of white blood cell (neutrophil) in your body becomes lower than normal. Neutrophils are made in the bone marrow and fight infections. These cells protect against bacteria and viruses. The fewer neutrophils you have, and the longer your body remains without them, the greater your risk of getting a severe infection becomes. CAUSES  The cause of neutropenia may be hard to determine. However, it is usually due to 3 main problems:   Decreased production of neutrophils. This may be due to:  Certain medicines such as chemotherapy.  Genetic problems.  Cancer.  Radiation treatments.  Vitamin deficiency.  Some pesticides.  Increased destruction of neutrophils. This may be due to:  Overwhelming infections.  Hemolytic anemia. This is when the body destroys its own blood cells.  Chemotherapy.  Neutrophils moving to areas of the body where they cannot fight infections. This may be due to:  Dialysis procedures.  Conditions where the spleen becomes enlarged. Neutrophils are held in the spleen and are not available to the rest of the body.  Overwhelming  infections. The neutrophils are held in the area of the infection and are not available to the rest of the body. SYMPTOMS  There are no specific symptoms of neutropenia. The lack of neutrophils can result in an infection, and an infection can cause various problems. DIAGNOSIS  Diagnosis is made by a blood test. A complete blood count is performed. The normal level of neutrophils in human blood differs with age and race. Infants have lower counts than older children and adults. African Americans have lower counts than Caucasians or Asians. The average adult level is 1500 cells/mm3 of blood. Neutrophil counts are interpreted as follows:  Greater than 1000 cells/mm3 gives normal protection against infection.  500 to 1000 cells/mm3 gives an increased risk for infection.  200 to 500 cells/mm3 is a greater risk for severe infection.  Lower than 200 cells/mm3 is a marked risk of infection. This may require hospitalization and treatment with antibiotic medicines. TREATMENT  Treatment depends on the underlying cause, severity, and presence of infections or symptoms. It also depends on your health. Your caregiver will discuss the treatment plan with you. Mild cases are often easily treated and have a good outcome. Preventative measures  may also be started to limit your risk of infections. Treatment can include:  Taking antibiotics.  Stopping medicines that are known to cause neutropenia.  Correcting nutritional deficiencies by eating green vegetables to supply folic acid and taking vitamin B supplements.  Stopping exposure to pesticides if your neutropenia is related to pesticide exposure.  Taking a blood growth factor called sargramostim, pegfilgrastim, or filgrastim if you are undergoing chemotherapy for cancer. This stimulates white blood cell production.  Removal of the spleen if you have Felty's syndrome and have repeated infections. HOME CARE INSTRUCTIONS   Follow your caregiver's  instructions about when you need to have blood work done.  Wash your hands often. Make sure others who come in contact with you also wash their hands.  Wash raw fruits and vegetables before eating them. They can carry bacteria and fungi.  Avoid people with colds or spreadable (contagious) diseases (chickenpox, herpes zoster, influenza).  Avoid large crowds.  Avoid construction areas. The dust can release fungus into the air.  Be cautious around children in daycare or school environments.  Take care of your respiratory system by coughing and deep breathing.  Bathe daily.  Protect your skin from cuts and burns.  Do not work in the garden or with flowers and plants.  Care for the mouth before and after meals by brushing with a soft toothbrush. If you have mucositis, do not use mouthwash. Mouthwash contains alcohol and can dry out the mouth even more.  Clean the area between the genitals and the anus (perineal area) after urination and bowel movements. Women need to wipe from front to back.  Use a water soluble lubricant during sexual intercourse and practice good hygiene after. Do not have intercourse if you are severely neutropenic. Check with your caregiver for guidelines.  Exercise daily as tolerated.  Avoid people who were vaccinated with a live vaccine in the past 30 days. You should not receive live vaccines (polio, typhoid).  Do not provide direct care for pets. Avoid animal droppings. Do not clean litter boxes and bird cages.  Do not share food utensils.  Do not use tampons, enemas, or rectal suppositories unless directed by your caregiver.  Use an electric razor to remove hair.  Wash your hands after handling magazines, letters, and newspapers. SEEK IMMEDIATE MEDICAL CARE IF:   You have a fever.  You have chills or start to shake.  You feel nauseous or vomit.  You develop mouth sores.  You develop aches and pains.  You have redness and swelling around open  wounds.  Your skin is warm to the touch.  You have pus coming from your wounds.  You develop swollen lymph nodes.  You feel weak or fatigued.  You develop red streaks on the skin. MAKE SURE YOU:  Understand these instructions.  Will watch your condition.  Will get help right away if you are not doing well or get worse.   This information is not intended to replace advice given to you by your health care provider. Make sure you discuss any questions you have with your health care provider.   Document Released: 03/07/2002 Document Revised: 12/08/2011 Document Reviewed: 03/28/2015 Elsevier Interactive Patient Education [employer].

## 2016-07-10 NOTE — Telephone Encounter (Signed)
Pt added top [facility] for neutrapenia, nausea, sore throat.

## 2016-07-10 NOTE — Telephone Encounter (Signed)
Called and spoke with patient,  asking about patient question from message from Med/Onc Rn, asking about his radiation therapy , he completed his radiation 06/30/16, and has a follow up on 08/19/16 with our PA, [name], asked how his swallowing was, his voice is better, eating some better,can feel mass smaller stated patient", he ate enchiladas fine not a burrito stated, patient, he was asking if he was going to get any more radiation,  deferred that to his follow up with our PA in November, patient thanked me for calling and checking with him 9:25 AM

## 2016-07-11 ENCOUNTER — Ambulatory Visit: Payer: Medicaid Other

## 2016-07-13 ENCOUNTER — Encounter: Payer: Self-pay | Admitting: Nurse Practitioner

## 2016-07-13 DIAGNOSIS — E860 Dehydration: Secondary | ICD-10-CM | POA: Insufficient documentation

## 2016-07-13 DIAGNOSIS — R110 Nausea: Secondary | ICD-10-CM | POA: Insufficient documentation

## 2016-07-13 DIAGNOSIS — R21 Rash and other nonspecific skin eruption: Secondary | ICD-10-CM | POA: Insufficient documentation

## 2016-07-13 DIAGNOSIS — R1310 Dysphagia, unspecified: Secondary | ICD-10-CM | POA: Insufficient documentation

## 2016-07-13 DIAGNOSIS — D61810 Antineoplastic chemotherapy induced pancytopenia: Secondary | ICD-10-CM | POA: Insufficient documentation

## 2016-07-13 NOTE — Assessment & Plan Note (Signed)
Patient reports a mild rash to his neck and chest.  He states he does occasionally itch.  On exam today.  It was noted that patient does have a trace rash to his neck area.  No evidence of infection.  Patient was advised he may try hydrocortisone cream to the rash.  He may also try Benadryl and Pepcid as well.  Written instructions regarding the Benadryl and Pepcid use were given to the patient today.

## 2016-07-13 NOTE — Assessment & Plan Note (Signed)
Patient received his first cycle of chemotherapy on 07/02/2016.  Blood counts obtained today reveal a WBC of 0.9, ANC 0.6, hemoglobin 13.3, platelet count 86.  Patient denies any fever or chills.  He also denies any easy bleeding or bruising.  At this point.  Patient was afebrile today and all other vital signs were stable. Reviewed all findings and details of today's visit with Dr. Burr Medico; and she was in agreement with this plan of care.

## 2016-07-13 NOTE — Assessment & Plan Note (Signed)
Patient states that he has been experiencing some chronic nausea; and has some sore throat./Dysphagia as well.  He states that he feels that he may vomit when he is trying to swallow some hard foods.  Patient was prescribed Zofran ODT to see if this helps with his nausea.

## 2016-07-13 NOTE — Progress Notes (Signed)
SYMPTOM MANAGEMENT [facility]    Chief Complaint: Dysphagia, dehydration, rash  HPI:  RAHIL PASSEY 60 y.o. male diagnosed with  lung cancer with bone metastasis.  Currently undergoing carboplatin/Alimta/Avastin chemotherapy regimen.   No history exists.    Review of Systems  Constitutional: Positive for malaise/fatigue.  HENT: Positive for sore throat.   Skin: Positive for itching and rash.  All other systems reviewed and are negative.   Past Medical History:  Diagnosis Date  . Encounter for antineoplastic chemotherapy 06/24/2016  . Lung mass 05/19/2016  . Pneumonia    in college    Past Surgical History:  Procedure Laterality Date  . APPENDECTOMY    . 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    is allergic to penicillins.    Medication List       Accurate as of 07/10/16 11:59 PM. Always use your most recent med list.          dexamethasone 4 MG tablet Commonly known as:  DECADRON 4 mg by mouth twice a day the day before, day of and day after the chemotherapy every 3 weeks   folic acid 1 MG tablet Commonly known as:  FOLVITE Take 1 tablet (1 mg total) by mouth daily.   ondansetron 8 MG disintegrating tablet Commonly known as:  ZOFRAN ODT Take 1 tablet (8 mg total) by mouth every 8 (eight) hours as needed for nausea or vomiting.   oxyCODONE-acetaminophen 5-325 MG tablet Commonly known as:  PERCOCET/ROXICET Take 1-2 tablets by mouth every 6 (six) hours as needed for severe pain.   prochlorperazine  10 MG tablet Commonly known as:  COMPAZINE Take 1 tablet (10 mg total) by mouth every 6 (six) hours as needed for nausea or vomiting.   SONAFINE Apply 1 application topically daily. Apply sonafine to chest area after rad tx daily  and prn        PHYSICAL EXAMINATION  Oncology Vitals 07/10/2016 07/02/2016  Height 188 cm -  Weight 82.963 kg -  Weight (lbs) 182 lbs 14 oz -  BMI (kg/m2) 23.48 kg/m2 -  Temp 98.4 98.7  Pulse 87 98  Resp 18 20  SpO2 99 99  BSA (m2) 2.08 m2 -   BP Readings from Last 2 Encounters:  07/10/16 117/66  07/02/16 115/67    Physical Exam  Constitutional: He is oriented to person, place, and time and well-developed, well-nourished, and in no distress.  HENT:  Head: Normocephalic and atraumatic.  Mouth/Throat: Oropharynx is clear and moist.  Eyes: Conjunctivae and EOM are normal. Pupils are equal, round, and reactive to light. Right eye exhibits no discharge. Left eye exhibits no discharge. No scleral icterus.  Neck: Normal range of motion. Neck supple. No JVD present. No tracheal deviation present. No thyromegaly present.  Cardiovascular: Normal rate, regular rhythm, normal heart sounds and intact distal pulses.   Pulmonary/Chest: Effort normal and breath sounds normal. No respiratory distress. He has no wheezes. He has no rales. He exhibits no tenderness.  Abdominal: Soft. Bowel sounds are normal. He exhibits no distension and no mass. There is no tenderness. There is no rebound and  no guarding.  Musculoskeletal: Normal range of motion. He exhibits no edema, tenderness or deformity.  Lymphadenopathy:    He has no cervical adenopathy.  Neurological: He is alert and oriented to person, place, and time. Gait normal.  Skin: Skin is warm and dry. Rash noted. No erythema. No pallor.  Psychiatric: Affect normal.  Nursing note and vitals reviewed.   LABORATORY DATA:. Appointment on 07/10/2016  Component Date Value Ref Range Status  . WBC 07/10/2016 0.9* 4.0  - 10.3 10e3/uL Final  . NEUT# 07/10/2016 0.6* 1.5 - 6.5 10e3/uL Final  . HGB 07/10/2016 13.3  13.0 - 17.1 g/dL Final  . HCT 07/10/2016 39.4  38.4 - 49.9 % Final  . Platelets 07/10/2016 86* 140 - 400 10e3/uL Final  . MCV 07/10/2016 83.3  79.3 - 98.0 fL Final  . MCH 07/10/2016 28.1  27.2 - 33.4 pg Final  . MCHC 07/10/2016 33.8  32.0 - 36.0 g/dL Final  . RBC 07/10/2016 4.73  4.20 - 5.82 10e6/uL Final  . RDW 07/10/2016 15.6* 11.0 - 14.6 % Final  . lymph# 07/10/2016 0.2* 0.9 - 3.3 10e3/uL Final  . MONO# 07/10/2016 0.1  0.1 - 0.9 10e3/uL Final  . Eosinophils Absolute 07/10/2016 0.0  0.0 - 0.5 10e3/uL Final  . Basophils Absolute 07/10/2016 0.0  0.0 - 0.1 10e3/uL Final  . NEUT% 07/10/2016 63.3  39.0 - 75.0 % Final  . LYMPH% 07/10/2016 23.0  14.0 - 49.0 % Final  . MONO% 07/10/2016 9.2  0.0 - 14.0 % Final  . EOS% 07/10/2016 3.4  0.0 - 7.0 % Final  . BASO% 07/10/2016 1.1  0.0 - 2.0 % Final  . nRBC 07/10/2016 0  0 - 0 % Final  . Sodium 07/10/2016 138  136 - 145 mEq/L Final  . Potassium 07/10/2016 4.8  3.5 - 5.1 mEq/L Final  . Chloride 07/10/2016 102  98 - 109 mEq/L Final  . CO2 07/10/2016 27  22 - 29 mEq/L Final  . Glucose 07/10/2016 89  70 - 140 mg/dl Final  . BUN 07/10/2016 13.0  7.0 - 26.0 mg/dL Final  . Creatinine 07/10/2016 0.8  0.7 - 1.3 mg/dL Final  . Total Bilirubin 07/10/2016 0.52  0.20 - 1.20 mg/dL Final  . Alkaline Phosphatase 07/10/2016 68  40 - 150 U/L Final  . AST 07/10/2016 22  5 - 34 U/L Final  . ALT 07/10/2016 14  0 - 55 U/L Final  . Total Protein 07/10/2016 7.3  6.4 - 8.3 g/dL Final  . Albumin 07/10/2016 3.4* 3.5 - 5.0 g/dL Final  . Calcium 07/10/2016 9.7  8.4 - 10.4 mg/dL Final  . Anion Gap 07/10/2016 8  3 - 11 mEq/L Final  . EGFR 07/10/2016 >90  >90 ml/min/1.73 m2 Final    RADIOGRAPHIC STUDIES: No results found.  ASSESSMENT/PLAN:    Rash Patient reports a mild rash to his neck and chest.  He states he does occasionally itch.  On exam today.  It was noted that  patient does have a trace rash to his neck area.  No evidence of infection.  Patient was advised he may try hydrocortisone cream to the rash.  He may also try Benadryl and Pepcid as well.  Written instructions regarding the Benadryl and Pepcid use were given to the patient today.  Nausea without vomiting Patient states that he has been experiencing some chronic nausea; and has some sore throat./Dysphagia as well.  He states that he feels that he may vomit when he is trying  to swallow some hard foods.  Patient was prescribed Zofran ODT to see if this helps with his nausea.    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  He completed his radiation treatments on 06/30/2016.  He is scheduled to return for labs and a follow-up visit on 07/17/2016.    Dysphagia Patient states that he has been experiencing some chronic nausea; and has some sore throat./Dysphagia as well.  He states that he feels that he may vomit when he is trying to swallow some hard foods.  Patient appeared mildly dehydrated today; and was given IV fluid rehydration; as well as Zofran IV.  Exam today revealed no obvious oral lesions in posterior oropharynx was clear with no erythema or thrush.  Advised patient that his dysphagia was most likely secondary to his recent radiation treatments; and would hopefully improve shortly.  Since he has completed all radiation treatments at this time.  Patient was prescribed Zofran ODT to see if this helps with his nausea.  Patient requested and was given Percocet pain medication and a small amount for treatment of the dysphagia.  He was encouraged to push fluids at home is much as possible.  Dehydration Patient states that he has been experiencing some chronic nausea; and has some sore throat./Dysphagia as well.  He states that he feels that he may vomit when he is trying to swallow  some hard foods.  Patient appeared mildly dehydrated today; and was given IV fluid rehydration; as well as Zofran IV.  Exam today revealed no obvious oral lesions in posterior oropharynx was clear with no erythema or thrush.  Advised patient that his dysphagia was most likely secondary to his recent radiation treatments; and would hopefully improve shortly.  Since he has completed all radiation treatments at this time.  Patient was prescribed Zofran ODT to see if this helps with his nausea.  Patient requested and was given Percocet pain medication and a small amount for treatment of the dysphagia.  He was encouraged to push fluids at home is much as possible.  Antineoplastic chemotherapy induced pancytopenia (CODE) ([place]) Patient received his first cycle of chemotherapy on 07/02/2016.  Blood counts obtained today reveal a WBC of 0.9, ANC 0.6, hemoglobin 13.3, platelet count 86.  Patient denies any fever or chills.  He also denies any easy bleeding or bruising.  At this point.  Patient was afebrile today and all other vital signs were stable. Reviewed all findings and details of today's visit with Dr. Burr Medico; and she was in agreement with this plan of care.   Patient stated understanding of all instructions; and was in agreement with this plan of care. The patient knows to call the clinic with any problems, questions or concerns.   Total time spent with patient was 25 minutes;  with greater than 75 percent of that time spent in face to face counseling regarding patient's symptoms,  and coordination of care and follow up.  Disclaimer:This dictation was prepared with Dragon/digital dictation along with [employer]. Any transcriptional errors that result from this process are unintentional.  Drue Second, NP 07/13/2016

## 2016-07-13 NOTE — Assessment & Plan Note (Signed)
Patient states that he has been experiencing some chronic nausea; and has some sore throat./Dysphagia as well.  He states that he feels that he may vomit when he is trying to swallow some hard foods.  Patient appeared mildly dehydrated today; and was given IV fluid rehydration; as well as Zofran IV.  Exam today revealed no obvious oral lesions in posterior oropharynx was clear with no erythema or thrush.  Advised patient that his dysphagia was most likely secondary to his recent radiation treatments; and would hopefully improve shortly.  Since he has completed all radiation treatments at this time.  Patient was prescribed Zofran ODT to see if this helps with his nausea.  Patient requested and was given Percocet pain medication and a small amount for treatment of the dysphagia.  He was encouraged to push fluids at home is much as possible.

## 2016-07-13 NOTE — Assessment & Plan Note (Signed)
Patient received cycle one of his carboplatin/Alimta/Avastin chemotherapy regimen on 07/02/2016.  He completed his radiation treatments on 06/30/2016.  He is scheduled to return for labs and a follow-up visit on 07/17/2016.

## 2016-07-14 ENCOUNTER — Ambulatory Visit: Payer: Medicaid Other

## 2016-07-14 ENCOUNTER — Telehealth: Payer: Self-pay | Admitting: *Deleted

## 2016-07-14 NOTE — Telephone Encounter (Signed)
TC to patient to follow with him re: [facility] visit last week for IV fluids. Spoke with patient -he states he is feeling much better. He states the percocet has taken the edge off the pain in his throat. He is eating and drinking better and states that his taste sensation is starting to return. He is aware of his appt later this week.  No other questions or concerns.

## 2016-07-15 ENCOUNTER — Ambulatory Visit: Payer: Medicaid Other

## 2016-07-16 ENCOUNTER — Other Ambulatory Visit: Payer: Self-pay

## 2016-07-16 ENCOUNTER — Ambulatory Visit: Payer: Medicaid Other

## 2016-07-17 ENCOUNTER — Encounter: Payer: Self-pay | Admitting: Oncology

## 2016-07-17 ENCOUNTER — Ambulatory Visit: Payer: Medicaid Other

## 2016-07-17 ENCOUNTER — Ambulatory Visit (HOSPITAL_BASED_OUTPATIENT_CLINIC_OR_DEPARTMENT_OTHER): Payer: Medicaid Other | Admitting: Oncology

## 2016-07-17 ENCOUNTER — Other Ambulatory Visit (HOSPITAL_BASED_OUTPATIENT_CLINIC_OR_DEPARTMENT_OTHER): Payer: Medicaid Other

## 2016-07-17 VITALS — BP 115/70 | HR 96 | Temp 98.3°F | Resp 16 | Ht 74.0 in | Wt 185.0 lb

## 2016-07-17 DIAGNOSIS — D701 Agranulocytosis secondary to cancer chemotherapy: Secondary | ICD-10-CM | POA: Diagnosis not present

## 2016-07-17 DIAGNOSIS — C7951 Secondary malignant neoplasm of bone: Secondary | ICD-10-CM | POA: Diagnosis not present

## 2016-07-17 DIAGNOSIS — C3411 Malignant neoplasm of upper lobe, right bronchus or lung: Secondary | ICD-10-CM

## 2016-07-17 LAB — COMPREHENSIVE METABOLIC PANEL
ALT: 56 U/L — ABNORMAL HIGH (ref 0–55)
AST: 42 U/L — AB (ref 5–34)
Albumin: 3.1 g/dL — ABNORMAL LOW (ref 3.5–5.0)
Alkaline Phosphatase: 69 U/L (ref 40–150)
Anion Gap: 9 mEq/L (ref 3–11)
BILIRUBIN TOTAL: 0.28 mg/dL (ref 0.20–1.20)
BUN: 10.8 mg/dL (ref 7.0–26.0)
CO2: 25 meq/L (ref 22–29)
Calcium: 9.5 mg/dL (ref 8.4–10.4)
Chloride: 106 mEq/L (ref 98–109)
Creatinine: 0.8 mg/dL (ref 0.7–1.3)
GLUCOSE: 89 mg/dL (ref 70–140)
Potassium: 4.6 mEq/L (ref 3.5–5.1)
SODIUM: 140 meq/L (ref 136–145)
TOTAL PROTEIN: 7.1 g/dL (ref 6.4–8.3)

## 2016-07-17 LAB — CBC WITH DIFFERENTIAL/PLATELET
BASO%: 0 % (ref 0.0–2.0)
Basophils Absolute: 0 10*3/uL (ref 0.0–0.1)
EOS%: 7.8 % — AB (ref 0.0–7.0)
Eosinophils Absolute: 0.1 10*3/uL (ref 0.0–0.5)
HCT: 36.5 % — ABNORMAL LOW (ref 38.4–49.9)
HEMOGLOBIN: 12.1 g/dL — AB (ref 13.0–17.1)
LYMPH%: 30.4 % (ref 14.0–49.0)
MCH: 27.6 pg (ref 27.2–33.4)
MCHC: 33.2 g/dL (ref 32.0–36.0)
MCV: 83.3 fL (ref 79.3–98.0)
MONO#: 0.2 10*3/uL (ref 0.1–0.9)
MONO%: 21.6 % — AB (ref 0.0–14.0)
NEUT%: 40.2 % (ref 39.0–75.0)
NEUTROS ABS: 0.4 10*3/uL — AB (ref 1.5–6.5)
Platelets: 75 10*3/uL — ABNORMAL LOW (ref 140–400)
RBC: 4.38 10*6/uL (ref 4.20–5.82)
RDW: 15.7 % — AB (ref 11.0–14.6)
WBC: 1 10*3/uL — AB (ref 4.0–10.3)
lymph#: 0.3 10*3/uL — ABNORMAL LOW (ref 0.9–3.3)

## 2016-07-17 MED ORDER — OXYCODONE HCL 5 MG PO TABS: 5.0000 mg | ORAL_TABLET | ORAL | 0 refills | Status: DC | PRN

## 2016-07-17 MED ORDER — TBO-FILGRASTIM 480 MCG/0.8ML ~~LOC~~ SOSY
480.0000 ug | PREFILLED_SYRINGE | Freq: Once | SUBCUTANEOUS | Status: AC
  Administered 2016-07-17: 480 ug via SUBCUTANEOUS
  Filled 2016-07-17: qty 0.8

## 2016-07-17 MED FILL — oxyCODONE HCL 5 MG TABS: 5 | 3 days supply | Qty: 45 | Fill #0

## 2016-07-17 NOTE — Progress Notes (Signed)
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  GUARDANT 360: Negative for EGFR, ALK, ROS 1, BR AF mutations but showed amplification of EGFR and MET  PRIOR THERAPY: Palliative radiation to the right superior mediastinal mass and right paratracheal lymphadenopathy under the care of Dr. Lisbeth Renshaw.  CURRENT THERAPY: Systemic chemotherapy with carboplatin for AUC of 5, Alimta 500 MG/M2 and Avastin 15 MG/KG every 3 weeks. First dose 07/02/2016..  INTERVAL HISTORY: Edward Spence 60 y.o. male returns to the clinic today for follow-up visit. The patient is feeling fine today. States this is the best that he has felt in several weeks. He denied having any significant chest pain and has shortness breath with exertion with mild cough and no hemoptysis. He denied having any significant weight loss or night sweats. He has no nausea or vomiting, no fever or chills. Ports ongoing pain to his chest and shoulder. Using Percocet 6-10 tablets per day. He had several studies performed recently including molecular study which showed no actionable mutations. He is here today for symptom management visit.  MEDICAL HISTORY: Past Medical History:  Diagnosis Date  . Encounter for antineoplastic chemotherapy 06/24/2016  . Lung mass 05/19/2016  . Pneumonia    in college    ALLERGIES:  is allergic to penicillins.  MEDICATIONS:  Current Outpatient Prescriptions  Medication Sig Dispense Refill  . dexamethasone (DECADRON) 4 MG tablet 4 mg by mouth twice a day the day before, day of and day after the  chemotherapy every 3 weeks 40 tablet 1  . folic acid (FOLVITE) 1 MG tablet Take 1 tablet (1 mg total) by mouth daily. 30 tablet 4  . ondansetron (ZOFRAN ODT) 8 MG disintegrating tablet Take 1 tablet (8 mg total) by mouth every 8 (eight) hours as needed for nausea or vomiting. 30 tablet 1  . oxyCODONE-acetaminophen (PERCOCET/ROXICET) 5-325 MG tablet Take 1-2 tablets by mouth every 6 (six) hours as needed for severe pain. 45 tablet 0  . Wound Dressings (SONAFINE) Apply 1 application topically daily. Apply sonafine to chest area after rad tx daily  and prn    . oxyCODONE (OXY IR/ROXICODONE) 5 MG immediate release tablet Take 1-2 tablets (5-10 mg total) by mouth every 4 (four) hours as needed for severe pain. 45 tablet 0  . prochlorperazine (COMPAZINE) 10 MG tablet Take 1 tablet (10 mg total) by mouth every 6 (six) hours as needed for nausea or vomiting. (Patient not taking: Reported on 07/17/2016) 30 tablet 0   No current facility-administered medications for this visit.     SURGICAL HISTORY:  Past Surgical History:  Procedure Laterality Date  . APPENDECTOMY    . VIDEO BRONCHOSCOPY WITH ENDOBRONCHIAL ULTRASOUND N/A 05/22/2016   Procedure: VIDEO BRONCHOSCOPY WITH ENDOBRONCHIAL ULTRASOUND;  Surgeon: [name], MD;  Location: [facility];  Service: Thoracic;  Laterality: N/A;    REVIEW OF SYSTEMS:  Constitutional: negative Eyes: negative Ears, nose, mouth, throat, and face: negative Respiratory: negative Cardiovascular: negative Gastrointestinal: negative Genitourinary:negative Integument/breast: negative Hematologic/lymphatic: negative Musculoskeletal:negative Neurological: negative Behavioral/Psych: negative Endocrine: negative Allergic/Immunologic: negative   PHYSICAL EXAMINATION: General appearance: alert, cooperative, fatigued  and no distress Head: Normocephalic, without obvious abnormality, atraumatic Neck: no adenopathy, no JVD, supple, symmetrical, trachea midline and  thyroid not enlarged, symmetric, no tenderness/mass/nodules Lymph nodes: Cervical, supraclavicular, and axillary nodes normal. Resp: clear to auscultation bilaterally Back: symmetric, no curvature. ROM normal. No CVA tenderness. Cardio: regular rate and rhythm, S1, S2 normal, no murmur, click, rub or gallop GI: soft, non-tender; bowel sounds normal; no masses,  no organomegaly Extremities: extremities normal, atraumatic, no cyanosis or edema Neurologic: Alert and oriented X 3, normal strength and tone. Normal symmetric reflexes. Normal coordination and gait  ECOG PERFORMANCE STATUS: 1 - Symptomatic but completely ambulatory  Blood pressure 115/70, pulse 96, temperature 98.3 F (36.8 C), temperature source Oral, resp. rate 16, height _0  (1.88 m), weight 185 lb (83.9 kg), SpO2 100 %.  LABORATORY DATA: Lab Results  Component Value Date   WBC 1.0 (L) 07/17/2016   HGB 12.1 (L) 07/17/2016   HCT 36.5 (L) 07/17/2016   MCV 83.3 07/17/2016   PLT 75 (L) 07/17/2016      Chemistry      Component Value Date/Time   NA 140 07/17/2016 1122   K 4.6 07/17/2016 1122   CL 107 05/22/2016 0641   CO2 25 07/17/2016 1122   BUN 10.8 07/17/2016 1122   CREATININE 0.8 07/17/2016 1122      Component Value Date/Time   CALCIUM 9.5 07/17/2016 1122   ALKPHOS 69 07/17/2016 1122   AST 42 (H) 07/17/2016 1122   ALT 56 (H) 07/17/2016 1122   BILITOT 0.28 07/17/2016 1122       RADIOGRAPHIC STUDIES: No results found.  ASSESSMENT AND PLAN: This is a very pleasant 60 year old white male recently diagnosed with a stage IV non-small cell lung cancer, adenocarcinoma presented with large apical lung mass as well as bulky just final and right hilar adenopathy in addition to contralateral left hilar lymph nodes and osseous metastatic disease. 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 The patient has no actionable mutations. He is currently undergoing palliative  radiotherapy to the large right apical lung mass and mediastinal lymphadenopathy seen on under the care of Dr. Lisbeth Renshaw. He is currently on systemic chemotherapy with carboplatin for AUC of 5, Alimta 500 MG/M2 and Avastin 15 MG/KG every 3 weeks. He is status post one cycle of his chemotherapy.  His side effects have improved significantly. He remains neutropenic with an ANC of 0.4. Neutropenic precautions were discussed with the patient. He will receive Granix 480 g 2 days. First dose was given in the office today.  The patient was seen by Dr. Julien Nordmann who explained the need for Granix. He was also splint to the patient that last be rechecked next week when he is due for his next cycle of chemotherapy and if they remain low that chemotherapy may need to be delayed.  The patient return next week as scheduled for his second cycle of chemotherapy. He was advised to call immediately if he has any concerning symptoms in the interval. The patient voices understanding of current disease status and treatment options and is in agreement with the current care plan.  All questions were answered. The patient knows to call the clinic with any problems, questions or concerns. We can certainly see the patient much sooner if necessary.  Mikey Bussing, DNP, AGPCNP-BC, AOCNP    ADDENDUM: Hematology/Oncology Attending: I had a face to face encounter with the patient. I recommended his care plan. This is a  very pleasant 60 years old white male with a stage IV non-small cell lung cancer, adenocarcinoma currently undergoing systemic chemotherapy with carboplatin, Alimta and Avastin status post 1 cycle. Has been tolerating his treatment fairly well with no significant adverse effects. Unfortunately his total white blood count and absolute neutrophil count is low today. He is currently asymptomatic and afebrile. I recommended for the patient to receive 2 doses of Granix 480 mcg subcutaneously for 2 days. He would come  back for follow-up visit next week for start of cycle #2 as previously scheduled. The patient was advised to call immediately if he has any concerning symptoms in the interval.  Disclaimer: This note was dictated with voice recognition software. Similar sounding words can inadvertently be transcribed and may be missed upon review. Eilleen Kempf., MD 07/19/16

## 2016-07-17 NOTE — Patient Instructions (Signed)
Neutropenia Neutropenia is a condition that occurs when the level of a certain type of white blood cell (neutrophil) in your body becomes lower than normal. Neutrophils are made in the bone marrow and fight infections. These cells protect against bacteria and viruses. The fewer neutrophils you have, and the longer your body remains without them, the greater your risk of getting a severe infection becomes. CAUSES  The cause of neutropenia may be hard to determine. However, it is usually due to 3 main problems:   Decreased production of neutrophils. This may be due to:  Certain medicines such as chemotherapy.  Genetic problems.  Cancer.  Radiation treatments.  Vitamin deficiency.  Some pesticides.  Increased destruction of neutrophils. This may be due to:  Overwhelming infections.  Hemolytic anemia. This is when the body destroys its own blood cells.  Chemotherapy.  Neutrophils moving to areas of the body where they cannot fight infections. This may be due to:  Dialysis procedures.  Conditions where the spleen becomes enlarged. Neutrophils are held in the spleen and are not available to the rest of the body.  Overwhelming infections. The neutrophils are held in the area of the infection and are not available to the rest of the body. SYMPTOMS  There are no specific symptoms of neutropenia. The lack of neutrophils can result in an infection, and an infection can cause various problems. DIAGNOSIS  Diagnosis is made by a blood test. A complete blood count is performed. The normal level of neutrophils in human blood differs with age and race. Infants have lower counts than older children and adults. African Americans have lower counts than Caucasians or Asians. The average adult level is 1500 cells/mm3 of blood. Neutrophil counts are interpreted as follows:  Greater than 1000 cells/mm3 gives normal protection against infection.  500 to 1000 cells/mm3 gives an increased risk for  infection.  200 to 500 cells/mm3 is a greater risk for severe infection.  Lower than 200 cells/mm3 is a marked risk of infection. This may require hospitalization and treatment with antibiotic medicines. TREATMENT  Treatment depends on the underlying cause, severity, and presence of infections or symptoms. It also depends on your health. Your caregiver will discuss the treatment plan with you. Mild cases are often easily treated and have a good outcome. Preventative measures may also be started to limit your risk of infections. Treatment can include:  Taking antibiotics.  Stopping medicines that are known to cause neutropenia.  Correcting nutritional deficiencies by eating green vegetables to supply folic acid and taking vitamin B supplements.  Stopping exposure to pesticides if your neutropenia is related to pesticide exposure.  Taking a blood growth factor called sargramostim, pegfilgrastim, or filgrastim if you are undergoing chemotherapy for cancer. This stimulates white blood cell production.  Removal of the spleen if you have Felty's syndrome and have repeated infections. HOME CARE INSTRUCTIONS   Follow your caregiver's instructions about when you need to have blood work done.  Wash your hands often. Make sure others who come in contact with you also wash their hands.  Wash raw fruits and vegetables before eating them. They can carry bacteria and fungi.  Avoid people with colds or spreadable (contagious) diseases (chickenpox, herpes zoster, influenza).  Avoid large crowds.  Avoid construction areas. The dust can release fungus into the air.  Be cautious around children in daycare or school environments.  Take care of your respiratory system by coughing and deep breathing.  Bathe daily.  Protect your skin from cuts and   burns.  Do not work in the garden or with flowers and plants.  Care for the mouth before and after meals by brushing with a soft toothbrush. If you have  mucositis, do not use mouthwash. Mouthwash contains alcohol and can dry out the mouth even more.  Clean the area between the genitals and the anus (perineal area) after urination and bowel movements. Women need to wipe from front to back.  Use a water soluble lubricant during sexual intercourse and practice good hygiene after. Do not have intercourse if you are severely neutropenic. Check with your caregiver for guidelines.  Exercise daily as tolerated.  Avoid people who were vaccinated with a live vaccine in the past 30 days. You should not receive live vaccines (polio, typhoid).  Do not provide direct care for pets. Avoid animal droppings. Do not clean litter boxes and bird cages.  Do not share food utensils.  Do not use tampons, enemas, or rectal suppositories unless directed by your caregiver.  Use an electric razor to remove hair.  Wash your hands after handling magazines, letters, and newspapers. SEEK IMMEDIATE MEDICAL CARE IF:   You have a fever.  You have chills or start to shake.  You feel nauseous or vomit.  You develop mouth sores.  You develop aches and pains.  You have redness and swelling around open wounds.  Your skin is warm to the touch.  You have pus coming from your wounds.  You develop swollen lymph nodes.  You feel weak or fatigued.  You develop red streaks on the skin. MAKE SURE YOU:  Understand these instructions.  Will watch your condition.  Will get help right away if you are not doing well or get worse.   This information is not intended to replace advice given to you by your health care provider. Make sure you discuss any questions you have with your health care provider.   Document Released: 03/07/2002 Document Revised: 12/08/2011 Document Reviewed: 03/28/2015 Elsevier Interactive Patient Education [employer].

## 2016-07-18 ENCOUNTER — Ambulatory Visit: Payer: Medicaid Other

## 2016-07-18 ENCOUNTER — Ambulatory Visit (HOSPITAL_BASED_OUTPATIENT_CLINIC_OR_DEPARTMENT_OTHER): Payer: Medicaid Other

## 2016-07-18 DIAGNOSIS — D701 Agranulocytosis secondary to cancer chemotherapy: Secondary | ICD-10-CM

## 2016-07-18 DIAGNOSIS — C3411 Malignant neoplasm of upper lobe, right bronchus or lung: Secondary | ICD-10-CM | POA: Diagnosis not present

## 2016-07-18 MED ORDER — TBO-FILGRASTIM 480 MCG/0.8ML ~~LOC~~ SOSY
480.0000 ug | PREFILLED_SYRINGE | Freq: Once | SUBCUTANEOUS | Status: AC
  Administered 2016-07-18: 480 ug via SUBCUTANEOUS
  Filled 2016-07-18: qty 0.8

## 2016-07-18 NOTE — Patient Instructions (Signed)

## 2016-07-21 ENCOUNTER — Ambulatory Visit: Payer: Medicaid Other

## 2016-07-22 ENCOUNTER — Encounter: Payer: Self-pay | Admitting: Pharmacist

## 2016-07-22 ENCOUNTER — Ambulatory Visit: Payer: Medicaid Other

## 2016-07-23 ENCOUNTER — Other Ambulatory Visit (HOSPITAL_BASED_OUTPATIENT_CLINIC_OR_DEPARTMENT_OTHER): Payer: Medicaid Other

## 2016-07-23 ENCOUNTER — Ambulatory Visit (HOSPITAL_BASED_OUTPATIENT_CLINIC_OR_DEPARTMENT_OTHER): Payer: Medicaid Other

## 2016-07-23 ENCOUNTER — Ambulatory Visit (HOSPITAL_BASED_OUTPATIENT_CLINIC_OR_DEPARTMENT_OTHER): Payer: Medicaid Other | Admitting: Internal Medicine

## 2016-07-23 ENCOUNTER — Ambulatory Visit: Payer: Medicaid Other

## 2016-07-23 ENCOUNTER — Encounter: Payer: Self-pay | Admitting: Internal Medicine

## 2016-07-23 ENCOUNTER — Telehealth: Payer: Self-pay | Admitting: Internal Medicine

## 2016-07-23 VITALS — BP 129/77 | HR 95 | Temp 98.5°F | Resp 18 | Ht 74.0 in | Wt 182.2 lb

## 2016-07-23 VITALS — BP 142/71 | HR 83 | Temp 98.2°F | Resp 18

## 2016-07-23 DIAGNOSIS — K5903 Drug induced constipation: Secondary | ICD-10-CM

## 2016-07-23 DIAGNOSIS — C7951 Secondary malignant neoplasm of bone: Secondary | ICD-10-CM

## 2016-07-23 DIAGNOSIS — K5900 Constipation, unspecified: Secondary | ICD-10-CM | POA: Insufficient documentation

## 2016-07-23 DIAGNOSIS — Z5112 Encounter for antineoplastic immunotherapy: Secondary | ICD-10-CM | POA: Diagnosis not present

## 2016-07-23 DIAGNOSIS — Z5111 Encounter for antineoplastic chemotherapy: Secondary | ICD-10-CM

## 2016-07-23 DIAGNOSIS — C3411 Malignant neoplasm of upper lobe, right bronchus or lung: Secondary | ICD-10-CM

## 2016-07-23 HISTORY — DX: Constipation, unspecified: K59.00

## 2016-07-23 LAB — COMPREHENSIVE METABOLIC PANEL
ALBUMIN: 3.5 g/dL (ref 3.5–5.0)
ALK PHOS: 87 U/L (ref 40–150)
ALT: 19 U/L (ref 0–55)
ANION GAP: 11 meq/L (ref 3–11)
AST: 17 U/L (ref 5–34)
BUN: 13.4 mg/dL (ref 7.0–26.0)
CO2: 27 mEq/L (ref 22–29)
Calcium: 10.2 mg/dL (ref 8.4–10.4)
Chloride: 104 mEq/L (ref 98–109)
Creatinine: 0.8 mg/dL (ref 0.7–1.3)
GLUCOSE: 95 mg/dL (ref 70–140)
POTASSIUM: 4 meq/L (ref 3.5–5.1)
SODIUM: 142 meq/L (ref 136–145)
Total Bilirubin: 0.29 mg/dL (ref 0.20–1.20)
Total Protein: 8.2 g/dL (ref 6.4–8.3)

## 2016-07-23 LAB — CBC WITH DIFFERENTIAL/PLATELET
BASO%: 0.2 % (ref 0.0–2.0)
Basophils Absolute: 0 10*3/uL (ref 0.0–0.1)
EOS%: 0.2 % (ref 0.0–7.0)
Eosinophils Absolute: 0 10*3/uL (ref 0.0–0.5)
HCT: 39.5 % (ref 38.4–49.9)
HGB: 13.1 g/dL (ref 13.0–17.1)
LYMPH%: 16.8 % (ref 14.0–49.0)
MCH: 28 pg (ref 27.2–33.4)
MCHC: 33.2 g/dL (ref 32.0–36.0)
MCV: 84.4 fL (ref 79.3–98.0)
MONO#: 0.8 10*3/uL (ref 0.1–0.9)
MONO%: 15.7 % — ABNORMAL HIGH (ref 0.0–14.0)
NEUT#: 3.3 10*3/uL (ref 1.5–6.5)
NEUT%: 67.1 % (ref 39.0–75.0)
Platelets: 267 10*3/uL (ref 140–400)
RBC: 4.68 10*6/uL (ref 4.20–5.82)
RDW: 17 % — ABNORMAL HIGH (ref 11.0–14.6)
WBC: 4.9 10*3/uL (ref 4.0–10.3)
lymph#: 0.8 10*3/uL — ABNORMAL LOW (ref 0.9–3.3)

## 2016-07-23 MED ORDER — SODIUM CHLORIDE 0.9 % IV SOLN
703.0000 mg | Freq: Once | INTRAVENOUS | Status: AC
  Administered 2016-07-23: 700 mg via INTRAVENOUS
  Filled 2016-07-23: qty 70

## 2016-07-23 MED ORDER — PALONOSETRON HCL INJECTION 0.25 MG/5ML
0.2500 mg | Freq: Once | INTRAVENOUS | Status: AC
  Administered 2016-07-23: 0.25 mg via INTRAVENOUS

## 2016-07-23 MED ORDER — PALONOSETRON HCL INJECTION 0.25 MG/5ML
INTRAVENOUS | Status: AC
  Filled 2016-07-23: qty 5

## 2016-07-23 MED ORDER — DEXAMETHASONE SODIUM PHOSPHATE 10 MG/ML IJ SOLN
INTRAMUSCULAR | Status: AC
  Filled 2016-07-23: qty 1

## 2016-07-23 MED ORDER — DEXAMETHASONE SODIUM PHOSPHATE 10 MG/ML IJ SOLN
10.0000 mg | Freq: Once | INTRAMUSCULAR | Status: AC
  Administered 2016-07-23: 10 mg via INTRAVENOUS

## 2016-07-23 MED ORDER — SODIUM CHLORIDE 0.9 % IV SOLN
Freq: Once | INTRAVENOUS | Status: AC
  Administered 2016-07-23: 13:00:00 via INTRAVENOUS

## 2016-07-23 MED ORDER — SODIUM CHLORIDE 0.9 % IV SOLN
14.0000 mg/kg | Freq: Once | INTRAVENOUS | Status: AC
  Administered 2016-07-23: 1200 mg via INTRAVENOUS
  Filled 2016-07-23: qty 48

## 2016-07-23 MED ORDER — PEMETREXED DISODIUM CHEMO INJECTION 500 MG
475.0000 mg/m2 | Freq: Once | INTRAVENOUS | Status: AC
  Administered 2016-07-23: 1000 mg via INTRAVENOUS
  Filled 2016-07-23: qty 40

## 2016-07-23 NOTE — Patient Instructions (Signed)
[facility] Discharge Instructions for Patients Receiving Chemotherapy  Today you received the following chemotherapy agents:  Avastin, Alimta, Carboplatin  To help prevent nausea and vomiting after your treatment, we encourage you to take your nausea medication.   If you develop nausea and vomiting that is not controlled by your nausea medication, call the clinic.   BELOW ARE SYMPTOMS THAT SHOULD BE REPORTED IMMEDIATELY:  *FEVER GREATER THAN 100.5 F  *CHILLS WITH OR WITHOUT FEVER  NAUSEA AND VOMITING THAT IS NOT CONTROLLED WITH YOUR NAUSEA MEDICATION  *UNUSUAL SHORTNESS OF BREATH  *UNUSUAL BRUISING OR BLEEDING  TENDERNESS IN MOUTH AND THROAT WITH OR WITHOUT PRESENCE OF ULCERS  *URINARY PROBLEMS  *BOWEL PROBLEMS  UNUSUAL RASH Items with * indicate a potential emergency and should be followed up as soon as possible.  Feel free to call the clinic you have any questions or concerns. The clinic phone number is [phone].  Please show the CHEMO ALERT CARD at check-in to the Emergency Department and triage nurse.   

## 2016-07-23 NOTE — Progress Notes (Signed)
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  GUARDANT 360: Negative for EGFR, ALK, ROS 1, BR AF mutations but showed amplification of EGFR and MET  PRIOR THERAPY: Palliative radiation to the right superior mediastinal mass and right paratracheal lymphadenopathy under the care of Dr. Lisbeth Renshaw.  CURRENT THERAPY: Systemic chemotherapy with carboplatin for AUC of 5, Alimta 500 MG/M2 and Avastin 15 MG/KG every 3 weeks. First dose 07/01/2016. Status post one cycle.  INTERVAL HISTORY: Edward Spence 60 y.o. male returns to the clinic today for follow-up visit. The patient is feeling much better today. He tolerated the first cycle of his treatment fairly well except for chemotherapy-induced neutropenia requiring 2 doses of Granix last week. He continues to have lack of taste. He also has constipation for the last 3 days. He does not take any stool softener at this point. His pain is much improved and he discontinued oxycodone. He has no chest pain, shortness breath, cough or hemoptysis. He denied having any fever or chills. He has no nausea or vomiting. He is here today for evaluation before starting cycle #2.  MEDICAL HISTORY: Past Medical History:  Diagnosis Date  . Encounter for antineoplastic chemotherapy 06/24/2016  . Lung mass 05/19/2016  . Pneumonia    in college    ALLERGIES:  is allergic to penicillins.  MEDICATIONS:  Current Outpatient Prescriptions  Medication Sig Dispense Refill  . dexamethasone (DECADRON) 4 MG tablet 4 mg by mouth twice a day the day before, day of and  day after the chemotherapy every 3 weeks 40 tablet 1  . folic acid (FOLVITE) 1 MG tablet Take 1 tablet (1 mg total) by mouth daily. 30 tablet 4  . ondansetron (ZOFRAN ODT) 8 MG disintegrating tablet Take 1 tablet (8 mg total) by mouth every 8 (eight) hours as needed for nausea or vomiting. 30 tablet 1  . oxyCODONE (OXY IR/ROXICODONE) 5 MG immediate release tablet Take 1-2 tablets (5-10 mg total) by mouth every 4 (four) hours as needed for severe pain. 45 tablet 0  . oxyCODONE-acetaminophen (PERCOCET/ROXICET) 5-325 MG tablet Take 1-2 tablets by mouth every 6 (six) hours as needed for severe pain. 45 tablet 0  . prochlorperazine (COMPAZINE) 10 MG tablet Take 1 tablet (10 mg total) by mouth every 6 (six) hours as needed for nausea or vomiting. (Patient not taking: Reported on 07/17/2016) 30 tablet 0  . Wound Dressings (SONAFINE) Apply 1 application topically daily. Apply sonafine to chest area after rad tx daily  and prn     No current facility-administered medications for this visit.     SURGICAL HISTORY:  Past Surgical History:  Procedure Laterality Date  . APPENDECTOMY    . VIDEO BRONCHOSCOPY WITH ENDOBRONCHIAL ULTRASOUND N/A 05/22/2016   Procedure: VIDEO BRONCHOSCOPY WITH ENDOBRONCHIAL ULTRASOUND;  Surgeon: [name], MD;  Location: [place];  Service: Thoracic;  Laterality: N/A;    REVIEW OF SYSTEMS:  A comprehensive review of systems was negative except for: Constitutional: positive for fatigue Ears, nose, mouth, throat, and face: positive for Lack of taste Gastrointestinal: positive for constipation   PHYSICAL EXAMINATION: General  appearance: alert, cooperative, fatigued and no distress Head: Normocephalic, without obvious abnormality, atraumatic Neck: no adenopathy, no JVD, supple, symmetrical, trachea midline and thyroid not enlarged, symmetric, no tenderness/mass/nodules Lymph nodes: Cervical, supraclavicular, and axillary nodes normal. Resp: clear to auscultation  bilaterally Back: symmetric, no curvature. ROM normal. No CVA tenderness. Cardio: regular rate and rhythm, S1, S2 normal, no murmur, click, rub or gallop GI: soft, non-tender; bowel sounds normal; no masses,  no organomegaly Extremities: extremities normal, atraumatic, no cyanosis or edema Neurologic: Alert and oriented X 3, normal strength and tone. Normal symmetric reflexes. Normal coordination and gait  ECOG PERFORMANCE STATUS: 1 - Symptomatic but completely ambulatory  Blood pressure 129/77, pulse 95, temperature 98.5 F (36.9 C), temperature source Oral, resp. rate 18, height 6' 2"  (1.88 m), weight 182 lb 3.2 oz (82.6 kg), SpO2 99 %.  LABORATORY DATA: Lab Results  Component Value Date   WBC 4.9 07/23/2016   HGB 13.1 07/23/2016   HCT 39.5 07/23/2016   MCV 84.4 07/23/2016   PLT 267 07/23/2016      Chemistry      Component Value Date/Time   NA 142 07/23/2016 1007   K 4.0 07/23/2016 1007   CL 107 05/22/2016 0641   CO2 27 07/23/2016 1007   BUN 13.4 07/23/2016 1007   CREATININE 0.8 07/23/2016 1007      Component Value Date/Time   CALCIUM 10.2 07/23/2016 1007   ALKPHOS 87 07/23/2016 1007   AST 17 07/23/2016 1007   ALT 19 07/23/2016 1007   BILITOT 0.29 07/23/2016 1007       RADIOGRAPHIC STUDIES: No results found.  ASSESSMENT AND PLAN: This is a very pleasant 60 years old white male recently diagnosed with a stage IV non-small cell lung cancer, adenocarcinoma presented with large apical lung mass as well as bulky just final and right hilar adenopathy in addition to contralateral left hilar lymph nodes and osseous metastatic disease. 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 The patient has no actionable mutations. He completed palliative radiotherapy to the large right apical lung mass and mediastinal lymphadenopathy seen on under the care of Dr. Lisbeth Renshaw. He is currently undergoing systemic chemotherapy was carboplatin, Alimta  and Avastin status post 1 cycle. He tolerated the first cycle of his treatment well except for chemotherapy-induced neutropenia requiring treatment with growth factor. He is feeling fine today with no specific complaints. I recommended for him to proceed with cycle #2 today as a scheduled. For the constipation, advised the patient to take stool softener and MiraLAX if needed. He would come back for follow-up visit in 3 weeks for evaluation before starting cycle #3. He was advised to call immediately if he has any concerning symptoms in the interval. The patient voices understanding of current disease status and treatment options and is in agreement with the current care plan.  All questions were answered. The patient knows to call the clinic with any problems, questions or concerns. We can certainly see the patient much sooner if necessary.  Disclaimer: This note was dictated with voice recognition software. Similar sounding words can inadvertently be transcribed and may not be corrected upon review.

## 2016-07-23 NOTE — Telephone Encounter (Signed)
Appointments scheduled for patient per 10/25 LOS. The patient was given AVS report and calendar with future scheduled appointments.

## 2016-07-30 ENCOUNTER — Other Ambulatory Visit (HOSPITAL_BASED_OUTPATIENT_CLINIC_OR_DEPARTMENT_OTHER): Payer: Medicaid Other

## 2016-07-30 DIAGNOSIS — C3411 Malignant neoplasm of upper lobe, right bronchus or lung: Secondary | ICD-10-CM

## 2016-07-30 LAB — CBC WITH DIFFERENTIAL/PLATELET
BASO%: 1.1 % (ref 0.0–2.0)
BASOS ABS: 0 10*3/uL (ref 0.0–0.1)
EOS ABS: 0 10*3/uL (ref 0.0–0.5)
EOS%: 1 % (ref 0.0–7.0)
HEMATOCRIT: 37.4 % — AB (ref 38.4–49.9)
HEMOGLOBIN: 12.4 g/dL — AB (ref 13.0–17.1)
LYMPH#: 0.3 10*3/uL — AB (ref 0.9–3.3)
LYMPH%: 22.6 % (ref 14.0–49.0)
MCH: 27.5 pg (ref 27.2–33.4)
MCHC: 33.1 g/dL (ref 32.0–36.0)
MCV: 83.1 fL (ref 79.3–98.0)
MONO#: 0.1 10*3/uL (ref 0.1–0.9)
MONO%: 11.6 % (ref 0.0–14.0)
NEUT%: 63.7 % (ref 39.0–75.0)
NEUTROS ABS: 0.7 10*3/uL — AB (ref 1.5–6.5)
Platelets: 167 10*3/uL (ref 140–400)
RBC: 4.5 10*6/uL (ref 4.20–5.82)
RDW: 17.1 % — AB (ref 11.0–14.6)
WBC: 1.1 10*3/uL — AB (ref 4.0–10.3)

## 2016-07-30 LAB — COMPREHENSIVE METABOLIC PANEL
ALBUMIN: 3.4 g/dL — AB (ref 3.5–5.0)
ALK PHOS: 71 U/L (ref 40–150)
ALT: 13 U/L (ref 0–55)
AST: 19 U/L (ref 5–34)
Anion Gap: 9 mEq/L (ref 3–11)
BILIRUBIN TOTAL: 0.53 mg/dL (ref 0.20–1.20)
BUN: 16.9 mg/dL (ref 7.0–26.0)
CALCIUM: 9.5 mg/dL (ref 8.4–10.4)
CO2: 25 mEq/L (ref 22–29)
Chloride: 104 mEq/L (ref 98–109)
Creatinine: 0.8 mg/dL (ref 0.7–1.3)
Glucose: 85 mg/dl (ref 70–140)
POTASSIUM: 4.2 meq/L (ref 3.5–5.1)
SODIUM: 138 meq/L (ref 136–145)
TOTAL PROTEIN: 7.5 g/dL (ref 6.4–8.3)

## 2016-07-30 LAB — UA PROTEIN, DIPSTICK - CHCC: Protein, ur: NEGATIVE mg/dL

## 2016-08-06 ENCOUNTER — Other Ambulatory Visit (HOSPITAL_BASED_OUTPATIENT_CLINIC_OR_DEPARTMENT_OTHER): Payer: Medicaid Other

## 2016-08-06 DIAGNOSIS — C3411 Malignant neoplasm of upper lobe, right bronchus or lung: Secondary | ICD-10-CM | POA: Diagnosis present

## 2016-08-06 LAB — CBC WITH DIFFERENTIAL/PLATELET
BASO%: 0.4 % (ref 0.0–2.0)
Basophils Absolute: 0 10*3/uL (ref 0.0–0.1)
EOS%: 0.7 % (ref 0.0–7.0)
Eosinophils Absolute: 0 10*3/uL (ref 0.0–0.5)
HCT: 33.7 % — ABNORMAL LOW (ref 38.4–49.9)
HGB: 11.1 g/dL — ABNORMAL LOW (ref 13.0–17.1)
LYMPH%: 20 % (ref 14.0–49.0)
MCH: 27.9 pg (ref 27.2–33.4)
MCHC: 32.9 g/dL (ref 32.0–36.0)
MCV: 84.7 fL (ref 79.3–98.0)
MONO#: 0.5 10*3/uL (ref 0.1–0.9)
MONO%: 18.2 % — ABNORMAL HIGH (ref 0.0–14.0)
NEUT#: 1.7 10*3/uL (ref 1.5–6.5)
NEUT%: 60.7 % (ref 39.0–75.0)
PLATELETS: 69 10*3/uL — AB (ref 140–400)
RBC: 3.98 10*6/uL — AB (ref 4.20–5.82)
RDW: 17.2 % — ABNORMAL HIGH (ref 11.0–14.6)
WBC: 2.8 10*3/uL — ABNORMAL LOW (ref 4.0–10.3)
lymph#: 0.6 10*3/uL — ABNORMAL LOW (ref 0.9–3.3)

## 2016-08-06 LAB — COMPREHENSIVE METABOLIC PANEL
ALT: 14 U/L (ref 0–55)
ANION GAP: 11 meq/L (ref 3–11)
AST: 18 U/L (ref 5–34)
Albumin: 3.3 g/dL — ABNORMAL LOW (ref 3.5–5.0)
Alkaline Phosphatase: 82 U/L (ref 40–150)
BUN: 11.6 mg/dL (ref 7.0–26.0)
CHLORIDE: 107 meq/L (ref 98–109)
CO2: 23 meq/L (ref 22–29)
Calcium: 9.8 mg/dL (ref 8.4–10.4)
Creatinine: 0.8 mg/dL (ref 0.7–1.3)
Glucose: 85 mg/dl (ref 70–140)
POTASSIUM: 4.3 meq/L (ref 3.5–5.1)
Sodium: 141 mEq/L (ref 136–145)
Total Bilirubin: 0.56 mg/dL (ref 0.20–1.20)
Total Protein: 7.7 g/dL (ref 6.4–8.3)

## 2016-08-13 ENCOUNTER — Telehealth: Payer: Self-pay | Admitting: *Deleted

## 2016-08-13 ENCOUNTER — Ambulatory Visit (HOSPITAL_BASED_OUTPATIENT_CLINIC_OR_DEPARTMENT_OTHER): Payer: Medicaid Other | Admitting: Internal Medicine

## 2016-08-13 ENCOUNTER — Other Ambulatory Visit (HOSPITAL_BASED_OUTPATIENT_CLINIC_OR_DEPARTMENT_OTHER): Payer: Medicaid Other

## 2016-08-13 ENCOUNTER — Ambulatory Visit (HOSPITAL_BASED_OUTPATIENT_CLINIC_OR_DEPARTMENT_OTHER): Payer: Medicaid Other

## 2016-08-13 ENCOUNTER — Encounter: Payer: Self-pay | Admitting: Internal Medicine

## 2016-08-13 VITALS — BP 114/70 | HR 84

## 2016-08-13 VITALS — BP 136/72 | HR 102 | Temp 97.8°F | Resp 18 | Ht 74.0 in | Wt 180.8 lb

## 2016-08-13 DIAGNOSIS — Z5111 Encounter for antineoplastic chemotherapy: Secondary | ICD-10-CM

## 2016-08-13 DIAGNOSIS — Z5112 Encounter for antineoplastic immunotherapy: Secondary | ICD-10-CM | POA: Diagnosis not present

## 2016-08-13 DIAGNOSIS — C3411 Malignant neoplasm of upper lobe, right bronchus or lung: Secondary | ICD-10-CM

## 2016-08-13 DIAGNOSIS — C7951 Secondary malignant neoplasm of bone: Secondary | ICD-10-CM

## 2016-08-13 DIAGNOSIS — G893 Neoplasm related pain (acute) (chronic): Secondary | ICD-10-CM

## 2016-08-13 LAB — CBC WITH DIFFERENTIAL/PLATELET
BASO%: 0.4 % (ref 0.0–2.0)
Basophils Absolute: 0 10*3/uL (ref 0.0–0.1)
EOS%: 0.1 % (ref 0.0–7.0)
Eosinophils Absolute: 0 10*3/uL (ref 0.0–0.5)
HCT: 33.9 % — ABNORMAL LOW (ref 38.4–49.9)
HGB: 11.1 g/dL — ABNORMAL LOW (ref 13.0–17.1)
LYMPH%: 8 % — AB (ref 14.0–49.0)
MCH: 28 pg (ref 27.2–33.4)
MCHC: 32.8 g/dL (ref 32.0–36.0)
MCV: 85.4 fL (ref 79.3–98.0)
MONO#: 0.3 10*3/uL (ref 0.1–0.9)
MONO%: 5.8 % (ref 0.0–14.0)
NEUT#: 4.5 10*3/uL (ref 1.5–6.5)
NEUT%: 85.7 % — AB (ref 39.0–75.0)
Platelets: 305 10*3/uL (ref 140–400)
RBC: 3.97 10*6/uL — ABNORMAL LOW (ref 4.20–5.82)
RDW: 19.4 % — ABNORMAL HIGH (ref 11.0–14.6)
WBC: 5.2 10*3/uL (ref 4.0–10.3)
lymph#: 0.4 10*3/uL — ABNORMAL LOW (ref 0.9–3.3)

## 2016-08-13 LAB — COMPREHENSIVE METABOLIC PANEL
ALT: 19 U/L (ref 0–55)
AST: 18 U/L (ref 5–34)
Albumin: 3.3 g/dL — ABNORMAL LOW (ref 3.5–5.0)
Alkaline Phosphatase: 149 U/L (ref 40–150)
Anion Gap: 12 mEq/L — ABNORMAL HIGH (ref 3–11)
BUN: 18.8 mg/dL (ref 7.0–26.0)
CALCIUM: 10.2 mg/dL (ref 8.4–10.4)
CHLORIDE: 105 meq/L (ref 98–109)
CO2: 22 mEq/L (ref 22–29)
Creatinine: 0.8 mg/dL (ref 0.7–1.3)
GLUCOSE: 111 mg/dL (ref 70–140)
Potassium: 4.2 mEq/L (ref 3.5–5.1)
SODIUM: 139 meq/L (ref 136–145)
Total Bilirubin: 0.36 mg/dL (ref 0.20–1.20)
Total Protein: 8 g/dL (ref 6.4–8.3)

## 2016-08-13 MED ORDER — PALONOSETRON HCL INJECTION 0.25 MG/5ML
0.2500 mg | Freq: Once | INTRAVENOUS | Status: AC
  Administered 2016-08-13: 0.25 mg via INTRAVENOUS

## 2016-08-13 MED ORDER — DEXAMETHASONE SODIUM PHOSPHATE 10 MG/ML IJ SOLN
10.0000 mg | Freq: Once | INTRAMUSCULAR | Status: AC
  Administered 2016-08-13: 10 mg via INTRAVENOUS

## 2016-08-13 MED ORDER — SODIUM CHLORIDE 0.9 % IV SOLN
14.0000 mg/kg | Freq: Once | INTRAVENOUS | Status: AC
  Administered 2016-08-13: 1200 mg via INTRAVENOUS
  Filled 2016-08-13: qty 48

## 2016-08-13 MED ORDER — CYANOCOBALAMIN 1000 MCG/ML IJ SOLN
1000.0000 ug | Freq: Once | INTRAMUSCULAR | Status: AC
  Administered 2016-08-13: 1000 ug via INTRAMUSCULAR

## 2016-08-13 MED ORDER — PALONOSETRON HCL INJECTION 0.25 MG/5ML
INTRAVENOUS | Status: AC
  Filled 2016-08-13: qty 5

## 2016-08-13 MED ORDER — SODIUM CHLORIDE 0.9 % IV SOLN
475.0000 mg/m2 | Freq: Once | INTRAVENOUS | Status: AC
  Administered 2016-08-13: 1000 mg via INTRAVENOUS
  Filled 2016-08-13: qty 40

## 2016-08-13 MED ORDER — SODIUM CHLORIDE 0.9% FLUSH
10.0000 mL | INTRAVENOUS | Status: DC | PRN
  Filled 2016-08-13: qty 10

## 2016-08-13 MED ORDER — HEPARIN SOD (PORK) LOCK FLUSH 100 UNIT/ML IV SOLN
500.0000 [IU] | Freq: Once | INTRAVENOUS | Status: DC | PRN
  Filled 2016-08-13: qty 5

## 2016-08-13 MED ORDER — SODIUM CHLORIDE 0.9 % IV SOLN
Freq: Once | INTRAVENOUS | Status: AC
  Administered 2016-08-13: 12:00:00 via INTRAVENOUS

## 2016-08-13 MED ORDER — OXYCODONE HCL 5 MG PO TABS: 5.0000 mg | ORAL_TABLET | ORAL | 0 refills | Status: DC | PRN

## 2016-08-13 MED ORDER — DEXAMETHASONE SODIUM PHOSPHATE 10 MG/ML IJ SOLN
INTRAMUSCULAR | Status: AC
  Filled 2016-08-13: qty 1

## 2016-08-13 MED ORDER — CYANOCOBALAMIN 1000 MCG/ML IJ SOLN
INTRAMUSCULAR | Status: AC
  Filled 2016-08-13: qty 1

## 2016-08-13 MED ORDER — SODIUM CHLORIDE 0.9 % IV SOLN
703.0000 mg | Freq: Once | INTRAVENOUS | Status: AC
  Administered 2016-08-13: 700 mg via INTRAVENOUS
  Filled 2016-08-13: qty 70

## 2016-08-13 MED FILL — oxyCODONE HCL 5 MG TABS: 5 | 4 days supply | Qty: 45 | Fill #0

## 2016-08-13 NOTE — Progress Notes (Signed)
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  GUARDANT 360: Negative for EGFR, ALK, ROS 1, BR AF mutations but showed amplification of EGFR and MET  PRIOR THERAPY: Palliative radiation to the right superior mediastinal mass and right paratracheal lymphadenopathy under the care of Dr. Lisbeth Renshaw.  CURRENT THERAPY: Systemic chemotherapy with carboplatin for AUC of 5, Alimta 500 MG/M2 and Avastin 15 MG/KG every 3 weeks. First dose 07/01/2016. Status post 2 cycles.  INTERVAL HISTORY: Edward Spence 61 y.o. male returns to the clinic today for follow-up visit. The patient has no complaints today. He tolerated the second cycle of his systemic chemotherapy with carboplatin, Alimta and Avastin fairly well. He denied having any significant nausea or vomiting, no fever or chills. He denied having any significant chest pain, shortness of breath, cough or hemoptysis. He is requesting refill his pain medication. He is here today for evaluation before starting cycle #3.  MEDICAL HISTORY: Past Medical History:  Diagnosis Date  . Constipation 07/23/2016  . Encounter for antineoplastic chemotherapy 06/24/2016  . Lung mass 05/19/2016  . Pneumonia    in college    ALLERGIES:  is allergic to penicillins.  MEDICATIONS:  Current Outpatient Prescriptions  Medication Sig Dispense Refill  . dexamethasone (DECADRON) 4 MG tablet 4 mg by mouth twice a day the day before, day of and day after the chemotherapy every 3 weeks 40 tablet 1  . folic acid (FOLVITE) 1 MG tablet Take 1 tablet (1 mg total) by  mouth daily. 30 tablet 4  . ondansetron (ZOFRAN ODT) 8 MG disintegrating tablet Take 1 tablet (8 mg total) by mouth every 8 (eight) hours as needed for nausea or vomiting. 30 tablet 1  . oxyCODONE (OXY IR/ROXICODONE) 5 MG immediate release tablet Take 1-2 tablets (5-10 mg total) by mouth every 4 (four) hours as needed for severe pain. 45 tablet 0  . oxyCODONE-acetaminophen (PERCOCET/ROXICET) 5-325 MG tablet Take 1-2 tablets by mouth every 6 (six) hours as needed for severe pain. 45 tablet 0  . prochlorperazine (COMPAZINE) 10 MG tablet Take 1 tablet (10 mg total) by mouth every 6 (six) hours as needed for nausea or vomiting. (Patient not taking: Reported on 07/17/2016) 30 tablet 0  . Wound Dressings (SONAFINE) Apply 1 application topically daily. Apply sonafine to chest area after rad tx daily  and prn     No current facility-administered medications for this visit.     SURGICAL HISTORY:  Past Surgical History:  Procedure Laterality Date  . APPENDECTOMY    . VIDEO BRONCHOSCOPY WITH ENDOBRONCHIAL ULTRASOUND N/A 05/22/2016   Procedure: VIDEO BRONCHOSCOPY WITH ENDOBRONCHIAL ULTRASOUND;  Surgeon: [name], MD;  Location: [facility];  Service: Thoracic;  Laterality: N/A;    REVIEW OF SYSTEMS:  A comprehensive review of systems was negative except for: Constitutional: positive for fatigue   PHYSICAL EXAMINATION: General appearance: alert, cooperative, fatigued and no distress Head: Normocephalic, without obvious abnormality, atraumatic Neck: no adenopathy, no JVD, supple, symmetrical, trachea midline and thyroid not enlarged, symmetric, no tenderness/mass/nodules Lymph nodes: Cervical, supraclavicular, and axillary nodes normal. Resp: clear to auscultation bilaterally Back:  symmetric, no curvature. ROM normal. No CVA tenderness. Cardio: regular rate and rhythm, S1, S2 normal, no murmur, click, rub or gallop GI: soft, non-tender; bowel sounds normal; no masses,  no organomegaly Extremities:  extremities normal, atraumatic, no cyanosis or edema Neurologic: Alert and oriented X 3, normal strength and tone. Normal symmetric reflexes. Normal coordination and gait  ECOG PERFORMANCE STATUS: 1 - Symptomatic but completely ambulatory  There were no vitals taken for this visit.  LABORATORY DATA: Lab Results  Component Value Date   WBC 5.2 08/13/2016   HGB 11.1 (L) 08/13/2016   HCT 33.9 (L) 08/13/2016   MCV 85.4 08/13/2016   PLT 305 08/13/2016      Chemistry      Component Value Date/Time   NA 141 08/06/2016 0749   K 4.3 08/06/2016 0749   CL 107 05/22/2016 0641   CO2 23 08/06/2016 0749   BUN 11.6 08/06/2016 0749   CREATININE 0.8 08/06/2016 0749      Component Value Date/Time   CALCIUM 9.8 08/06/2016 0749   ALKPHOS 82 08/06/2016 0749   AST 18 08/06/2016 0749   ALT 14 08/06/2016 0749   BILITOT 0.56 08/06/2016 0749       RADIOGRAPHIC STUDIES: No results found.  ASSESSMENT AND PLAN: This is a very pleasant 60 years old white male recently diagnosed with a stage IV non-small cell lung cancer, adenocarcinoma presented with large apical lung mass as well as bulky just final and right hilar adenopathy in addition to contralateral left hilar lymph nodes and osseous metastatic disease. 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 The patient has no actionable mutations. He completed palliative radiotherapy to the large right apical lung mass and mediastinal lymphadenopathy on under the care of Dr. Lisbeth Renshaw. He is currently undergoing systemic chemotherapy with carboplatin, Alimta and Avastin status post 2 cycles. He tolerated the Second cycle of his treatment well except for mild fatigue. I recommended for him to proceed with cycle #3 today as a scheduled. He would come back for follow-up visit in 3 weeks for evaluation before starting cycle #4 after repeating CT scan of the chest, abdomen and pelvis for restaging of her disease. For  pain management, I gave him a refill of oxycodone. He was advised to call immediately if he has any concerning symptoms in the interval. The patient voices understanding of current disease status and treatment options and is in agreement with the current care plan.  All questions were answered. The patient knows to call the clinic with any problems, questions or concerns. We can certainly see the patient much sooner if necessary.  Disclaimer: This note was dictated with voice recognition software. Similar sounding words can inadvertently be transcribed and may not be corrected upon review.

## 2016-08-13 NOTE — Telephone Encounter (Signed)
Error in charting.

## 2016-08-13 NOTE — Patient Instructions (Signed)
[facility] Discharge Instructions for Patients Receiving Chemotherapy  Today you received the following chemotherapy agents:  Avastin, Alimta, Carboplatin  To help prevent nausea and vomiting after your treatment, we encourage you to take your nausea medication.   If you develop nausea and vomiting that is not controlled by your nausea medication, call the clinic.   BELOW ARE SYMPTOMS THAT SHOULD BE REPORTED IMMEDIATELY:  *FEVER GREATER THAN 100.5 F  *CHILLS WITH OR WITHOUT FEVER  NAUSEA AND VOMITING THAT IS NOT CONTROLLED WITH YOUR NAUSEA MEDICATION  *UNUSUAL SHORTNESS OF BREATH  *UNUSUAL BRUISING OR BLEEDING  TENDERNESS IN MOUTH AND THROAT WITH OR WITHOUT PRESENCE OF ULCERS  *URINARY PROBLEMS  *BOWEL PROBLEMS  UNUSUAL RASH Items with * indicate a potential emergency and should be followed up as soon as possible.  Feel free to call the clinic you have any questions or concerns. The clinic phone number is [phone].  Please show the CHEMO ALERT CARD at check-in to the Emergency Department and triage nurse.   

## 2016-08-19 ENCOUNTER — Ambulatory Visit
Admission: RE | Admit: 2016-08-19 | Discharge: 2016-08-19 | Disposition: A | Discharge status: Home / Self Care | Payer: Medicaid Other | Source: Ambulatory Visit | Attending: Radiation Oncology | Admitting: Radiation Oncology

## 2016-08-19 NOTE — Progress Notes (Addendum)
Mr. Ford Peddie. [place] is here for a one month follow up appointment for metastatic lung cancer with bony metastasis.  Weight changes, if any: Wt Readings from Last 3 Encounters:  08/20/16 172 lb 6.4 oz (78.2 kg)  08/13/16 180 lb 12.8 oz (82 kg)  07/23/16 182 lb 3.2 oz (82.6 kg)  Loss 8 lbs 6.4 oz without his jacket, had his jacket on last week when he weighed Respiratory complaints, if any: Coughing white secretion Hemoptysis, if any:  None Swallowing Problems/Pain/Difficulty swallowing: Discomfort when he swallows no mouth soreness, had problems this week, talking much better since his radiation treatments. Smoking Tobacco/Marijuana/Snuff/ETOH use:Fomer smoker x 40 years 1 p/d quit I year ago Skin:Normal to chest Pain : 4/10 to right chest taking Oxy IR  Denies any bone pain to right  Appetite:Hungry but  the food does not go down very well Fatigue:Mild fatigue When is next chemo scheduled?:Systemic chemotherapy with carboplatin for AUC of 5, Alimta 500 MG/M2 and Avastin 15 MG/KG every 3 weeks. 09-03-16 Lab work from of chart:08-13-16 Cmet, CBCw diff BP 121/83   Pulse (!) 103   Temp 97.9 F (36.6 C) (Oral)   Resp 18   Ht '6\' 2"'$  (1.88 m)   Wt 172 lb 6.4 oz (78.2 kg)   SpO2 100%   BMI 22.13 kg/m

## 2016-08-20 ENCOUNTER — Encounter: Payer: Self-pay | Admitting: Radiation Oncology

## 2016-08-20 ENCOUNTER — Other Ambulatory Visit (HOSPITAL_BASED_OUTPATIENT_CLINIC_OR_DEPARTMENT_OTHER): Payer: Medicaid Other

## 2016-08-20 ENCOUNTER — Ambulatory Visit
Admission: RE | Admit: 2016-08-20 | Discharge: 2016-08-20 | Disposition: A | Discharge status: Home / Self Care | Payer: Medicaid Other | Source: Ambulatory Visit | Attending: Radiation Oncology | Admitting: Radiation Oncology

## 2016-08-20 VITALS — BP 121/83 | HR 103 | Temp 97.9°F | Resp 18 | Ht 74.0 in | Wt 172.4 lb

## 2016-08-20 DIAGNOSIS — C3411 Malignant neoplasm of upper lobe, right bronchus or lung: Secondary | ICD-10-CM | POA: Diagnosis not present

## 2016-08-20 DIAGNOSIS — Z5189 Encounter for other specified aftercare: Secondary | ICD-10-CM | POA: Insufficient documentation

## 2016-08-20 DIAGNOSIS — C7951 Secondary malignant neoplasm of bone: Secondary | ICD-10-CM

## 2016-08-20 DIAGNOSIS — Z923 Personal history of irradiation: Secondary | ICD-10-CM | POA: Insufficient documentation

## 2016-08-20 DIAGNOSIS — Z79899 Other long term (current) drug therapy: Secondary | ICD-10-CM | POA: Diagnosis not present

## 2016-08-20 DIAGNOSIS — Z880 Allergy status to penicillin: Secondary | ICD-10-CM | POA: Insufficient documentation

## 2016-08-20 LAB — COMPREHENSIVE METABOLIC PANEL
ALT: 27 U/L (ref 0–55)
ANION GAP: 10 meq/L (ref 3–11)
AST: 27 U/L (ref 5–34)
Albumin: 3.5 g/dL (ref 3.5–5.0)
Alkaline Phosphatase: 112 U/L (ref 40–150)
BUN: 20.8 mg/dL (ref 7.0–26.0)
CALCIUM: 10 mg/dL (ref 8.4–10.4)
CHLORIDE: 105 meq/L (ref 98–109)
CO2: 23 meq/L (ref 22–29)
CREATININE: 0.8 mg/dL (ref 0.7–1.3)
EGFR: >90 mL/min/{1.73_m2} (ref >90)
Glucose: 85 mg/dl (ref 70–140)
POTASSIUM: 4.7 meq/L (ref 3.5–5.1)
Sodium: 139 mEq/L (ref 136–145)
Total Bilirubin: 0.55 mg/dL (ref 0.20–1.20)
Total Protein: 8 g/dL (ref 6.4–8.3)

## 2016-08-20 LAB — CBC WITH DIFFERENTIAL/PLATELET
BASO%: 1.3 % (ref 0.0–2.0)
BASOS ABS: 0 10*3/uL (ref 0.0–0.1)
EOS%: 3.8 % (ref 0.0–7.0)
Eosinophils Absolute: 0.1 10*3/uL (ref 0.0–0.5)
HCT: 34 % — ABNORMAL LOW (ref 38.4–49.9)
HGB: 11.1 g/dL — ABNORMAL LOW (ref 13.0–17.1)
LYMPH%: 25.6 % (ref 14.0–49.0)
MCH: 28 pg (ref 27.2–33.4)
MCHC: 32.6 g/dL (ref 32.0–36.0)
MCV: 85.9 fL (ref 79.3–98.0)
MONO#: 0.1 10*3/uL (ref 0.1–0.9)
MONO%: 6.3 % (ref 0.0–14.0)
NEUT#: 1 10*3/uL — ABNORMAL LOW (ref 1.5–6.5)
NEUT%: 63 % (ref 39.0–75.0)
Platelets: 196 10*3/uL (ref 140–400)
RBC: 3.96 10*6/uL — AB (ref 4.20–5.82)
RDW: 18.8 % — ABNORMAL HIGH (ref 11.0–14.6)
WBC: 1.6 10*3/uL — ABNORMAL LOW (ref 4.0–10.3)
lymph#: 0.4 10*3/uL — ABNORMAL LOW (ref 0.9–3.3)

## 2016-08-20 LAB — UA PROTEIN, DIPSTICK - CHCC: PROTEIN: NEGATIVE mg/dL

## 2016-08-20 NOTE — Progress Notes (Signed)
Radiation Oncology         [phone] ________________________________  Name: Edward Spence MRN: [id number]  Date: 08/20/2016  DOB: [birth year]  Post Treatment Note  CC: House, [name], FNP  House, [name], FNP  Diagnosis:   Stage IV (T3,N3,M1b) non-small cell lung cancer, adenocarcinoma presented with right upper lobe lung with disease in the right pelvis.  Interval Since Last Radiation:  6 weeks   06/09/2016 through 06/30/2016: The patient was treated to the right lung in a palliative manner to a dose of 37.5 gray in 15 fractions. The patient also received 30 gray in 10 fractions to the right pelvis. The right lung was treated with a three-field technique in the right pelvis was treated using a 4 field technique.   Narrative:  The patient returns today for routine follow-up. He tolerated radiotherapy and continues with systemic therapy now with Carboplatin, Alimta, and Avastin per Dr. Julien Nordmann.                             On review of systems, the patient states he's doing much better since radiation. He denies any chest pain, shortness of breath, fevers, or productive sputum. He denies hemoptysis, and states occasionally he will have a dry cough. His voice has returned and he is no longer hoarse. He denies any abdominal pain, or vomiting, but has lost some weight due to nausea and poor appetite and taste. No other complaints are verbalized.  ALLERGIES:  is allergic to penicillins.  Meds: Current Outpatient Prescriptions  Medication Sig Dispense Refill  . dexamethasone (DECADRON) 4 MG tablet 4 mg by mouth twice a day the day before, day of and day after the chemotherapy every 3 weeks 40 tablet 1  . folic acid (FOLVITE) 1 MG tablet Take 1 tablet (1 mg total) by mouth daily. 30 tablet 4  . oxyCODONE (OXY IR/ROXICODONE) 5 MG immediate release tablet Take 1-2 tablets (5-10 mg total) by mouth every 4 (four) hours as needed for severe pain. 45 tablet 0  . prochlorperazine (COMPAZINE)  10 MG tablet Take 1 tablet (10 mg total) by mouth every 6 (six) hours as needed for nausea or vomiting. 30 tablet 0  . ondansetron (ZOFRAN ODT) 8 MG disintegrating tablet Take 1 tablet (8 mg total) by mouth every 8 (eight) hours as needed for nausea or vomiting. (Patient not taking: Reported on 08/20/2016) 30 tablet 1  . oxyCODONE-acetaminophen (PERCOCET/ROXICET) 5-325 MG tablet Take 1-2 tablets by mouth every 6 (six) hours as needed for severe pain. (Patient not taking: Reported on 08/20/2016) 45 tablet 0   No current facility-administered medications for this encounter.     Physical Findings:  height is '6\' 2"'$  (1.88 m) and weight is 172 lb 6.4 oz (78.2 kg). His oral temperature is 97.9 F (36.6 C). His blood pressure is 121/83 and his pulse is 103 (abnormal). His respiration is 18 and oxygen saturation is 100%.  In general this is a well appearing caucasian male in no acute distress. He's alert and oriented x4 and appropriate throughout the examination. Cardiovascular exam reveals a regular rate and rhythm. No clicks, rubs, or murmurs are noted. Chest is clear to auscultation bilaterally.  Lab Findings: Lab Results  Component Value Date   WBC 1.6 (L) 08/20/2016   HGB 11.1 (L) 08/20/2016   HCT 34.0 (L) 08/20/2016   MCV 85.9 08/20/2016   PLT 196 08/20/2016     Radiographic Findings: No  results found.  Impression/Plan: 1. Stage IV (T3,N3,M1b) non-small cell lung cancer, adenocarcinoma presented with right upper lobe lung with disease in the right pelvis. The patient has done well with radiotherapy and is breathing better since radiotherapy was given. He is counseled on the risks of pneumonitis and will keep [place] informed of any questions or concerns he has. We will see him as needed moving forward, and defer management back to Dr. Julien Nordmann for his systemic therapy.       Carola Rhine, PAC

## 2016-08-20 NOTE — Addendum Note (Signed)
Encounter addended by: Malena Edman, RN on: 08/20/2016 11:41 AM<BR>    Actions taken: Charge Capture section accepted

## 2016-08-25 ENCOUNTER — Telehealth: Payer: Self-pay

## 2016-08-25 ENCOUNTER — Other Ambulatory Visit: Payer: Self-pay | Admitting: Internal Medicine

## 2016-08-25 DIAGNOSIS — Z5111 Encounter for antineoplastic chemotherapy: Secondary | ICD-10-CM

## 2016-08-25 DIAGNOSIS — C3411 Malignant neoplasm of upper lobe, right bronchus or lung: Secondary | ICD-10-CM

## 2016-08-25 MED ORDER — OXYCODONE HCL 5 MG PO TABS: 5.0000 mg | ORAL_TABLET | ORAL | 0 refills | Status: DC | PRN

## 2016-08-25 MED FILL — oxyCODONE HCL 5 MG TABS: 5 | 3 days supply | Qty: 45 | Fill #0

## 2016-08-25 NOTE — Telephone Encounter (Signed)
Pt walked in requesting oxycodone refill. rx prepared for signature then given to pt

## 2016-08-27 ENCOUNTER — Other Ambulatory Visit (HOSPITAL_BASED_OUTPATIENT_CLINIC_OR_DEPARTMENT_OTHER): Payer: Medicaid Other

## 2016-08-27 DIAGNOSIS — C3411 Malignant neoplasm of upper lobe, right bronchus or lung: Secondary | ICD-10-CM

## 2016-08-27 LAB — COMPREHENSIVE METABOLIC PANEL
ALBUMIN: 3.3 g/dL — AB (ref 3.5–5.0)
ALK PHOS: 90 U/L (ref 40–150)
ALT: 17 U/L (ref 0–55)
ANION GAP: 8 meq/L (ref 3–11)
AST: 22 U/L (ref 5–34)
BUN: 12.4 mg/dL (ref 7.0–26.0)
CALCIUM: 9.9 mg/dL (ref 8.4–10.4)
CO2: 25 mEq/L (ref 22–29)
CREATININE: 0.9 mg/dL (ref 0.7–1.3)
Chloride: 109 mEq/L (ref 98–109)
EGFR: >90 mL/min/{1.73_m2} (ref >90)
Glucose: 87 mg/dl (ref 70–140)
Potassium: 4.5 mEq/L (ref 3.5–5.1)
Sodium: 142 mEq/L (ref 136–145)
Total Bilirubin: 0.54 mg/dL (ref 0.20–1.20)
Total Protein: 7.6 g/dL (ref 6.4–8.3)

## 2016-08-27 LAB — CBC WITH DIFFERENTIAL/PLATELET
BASO%: 0 % (ref 0.0–2.0)
Basophils Absolute: 0 10*3/uL (ref 0.0–0.1)
EOS ABS: 0 10*3/uL (ref 0.0–0.5)
EOS%: 0.5 % (ref 0.0–7.0)
HEMATOCRIT: 28.9 % — AB (ref 38.4–49.9)
HEMOGLOBIN: 9.5 g/dL — AB (ref 13.0–17.1)
LYMPH#: 0.5 10*3/uL — AB (ref 0.9–3.3)
LYMPH%: 24.5 % (ref 14.0–49.0)
MCH: 28.4 pg (ref 27.2–33.4)
MCHC: 32.9 g/dL (ref 32.0–36.0)
MCV: 86.3 fL (ref 79.3–98.0)
MONO#: 0.2 10*3/uL (ref 0.1–0.9)
MONO%: 12.2 % (ref 0.0–14.0)
NEUT%: 62.8 % (ref 39.0–75.0)
NEUTROS ABS: 1.2 10*3/uL — AB (ref 1.5–6.5)
PLATELETS: 34 10*3/uL — AB (ref 140–400)
RBC: 3.35 10*6/uL — ABNORMAL LOW (ref 4.20–5.82)
RDW: 18.9 % — ABNORMAL HIGH (ref 11.0–14.6)
WBC: 1.9 10*3/uL — ABNORMAL LOW (ref 4.0–10.3)
nRBC: 0 % (ref 0–0)

## 2016-08-29 LAB — GUARDANT 360

## 2016-09-01 ENCOUNTER — Ambulatory Visit (HOSPITAL_COMMUNITY)
Admission: RE | Admit: 2016-09-01 | Discharge: 2016-09-01 | Disposition: A | Discharge status: Home / Self Care | Payer: Medicaid Other | Source: Ambulatory Visit | Attending: Internal Medicine | Admitting: Internal Medicine

## 2016-09-01 ENCOUNTER — Ambulatory Visit: Payer: Medicaid Other | Admitting: Nutrition

## 2016-09-01 ENCOUNTER — Encounter: Payer: Medicaid Other | Admitting: Nutrition

## 2016-09-01 ENCOUNTER — Encounter: Payer: Medicaid Other | Admitting: Nurse Practitioner

## 2016-09-01 ENCOUNTER — Telehealth: Payer: Self-pay | Admitting: *Deleted

## 2016-09-01 ENCOUNTER — Encounter (HOSPITAL_COMMUNITY): Payer: Self-pay

## 2016-09-01 DIAGNOSIS — R911 Solitary pulmonary nodule: Secondary | ICD-10-CM | POA: Diagnosis not present

## 2016-09-01 DIAGNOSIS — R590 Localized enlarged lymph nodes: Secondary | ICD-10-CM | POA: Insufficient documentation

## 2016-09-01 DIAGNOSIS — C7951 Secondary malignant neoplasm of bone: Secondary | ICD-10-CM | POA: Diagnosis not present

## 2016-09-01 DIAGNOSIS — K5900 Constipation, unspecified: Secondary | ICD-10-CM | POA: Insufficient documentation

## 2016-09-01 DIAGNOSIS — I700 Atherosclerosis of aorta: Secondary | ICD-10-CM | POA: Diagnosis not present

## 2016-09-01 DIAGNOSIS — Z5111 Encounter for antineoplastic chemotherapy: Secondary | ICD-10-CM

## 2016-09-01 DIAGNOSIS — C3411 Malignant neoplasm of upper lobe, right bronchus or lung: Secondary | ICD-10-CM | POA: Diagnosis not present

## 2016-09-01 MED ORDER — IOPAMIDOL (ISOVUE-300) INJECTION 61%
100.0000 mL | Freq: Once | INTRAVENOUS | Status: AC | PRN
  Administered 2016-09-01: 100 mL via INTRAVENOUS

## 2016-09-01 MED ORDER — IOPAMIDOL (ISOVUE-300) INJECTION 61%
INTRAVENOUS | Status: AC
  Filled 2016-09-01: qty 30

## 2016-09-01 MED ORDER — IOPAMIDOL (ISOVUE-300) INJECTION 61%
30.0000 mL | Freq: Once | INTRAVENOUS | Status: AC | PRN
  Administered 2016-09-01: 30 mL via ORAL

## 2016-09-01 NOTE — Progress Notes (Signed)
60 year old male diagnosed with non-small cell lung cancer.  He is a patient of Dr. Julien Nordmann.  Past medical history includes pneumonia and constipation.  Medications include Decadron, Folvite, Zofran, and Compazine.  Labs include albumin 3.3.  Height: 6 feet 2 inches. Weight: 172.4 pounds. Usual body weight: 230-235 pounds per patient.   Patient weighed 196 pounds in August 2017. BMI: 22.13.  Patient is status post radiation therapy. He is very concerned regarding his weight loss. (about 25% overall) His appetite is poor and he complains of nausea. He reports his nausea medication does not work. States he figured out he tolerates small frequent meals.  Nutrition diagnosis:  Severe Malnutrition related to 12% weight loss in 3 months and less than 75% energy intake for greater than one month. Patient has depletion of both muscle mass and body fat.  Intervention: Educated patient to continue small frequent meals and snacks with higher calorie foods and more protein. Reviewed appropriate ways to increase calories. Encouraged patient to take nausea medication. Educated him on strategies for eating to improve nausea. Recommended patient consume Carnation breakfast essentials and/or milkshakes on a daily basis. Provided samples and coupons. Fact sheets were provided.  Questions were answered.  Teach back method used.  Monitoring, evaluation, goals:  Patient will tolerate increased calories and protein to minimize further weight loss.  Next visit: Wednesday, December 27, during infusion.  **Disclaimer: This note was dictated with voice recognition software. Similar sounding words can inadvertently be transcribed and this note may contain transcription errors which may not have been corrected upon publication of note.**

## 2016-09-01 NOTE — Telephone Encounter (Signed)
Walk in form received reads refill for oxycodone two of which on med list.    To lobby for further assessment.  Patient not in lobby.  Schedule reads CT scan and nutritionist for today.    10:30 registration brought patient to Triage.  "I need Oxycodone IR 5 mg take 1-2 every 4 hours for severe pain.  I have seven pills left."   Called provider for this request.  Last filled on 08-25-2016.  Verbal order received and read back from Dr. Julien Nordmann for [facility] Evaluation for pain.  Orders given to Mr. Ebersole.  Sign in at registration for F/u for evaluation of pain expressed.  "I am not going to wait.  I have been up since 4:00 am.  I don't take these throughout the day but take two every four hours to get through the evening and night.  I have a mass in my lung and bones.  The lung mass hurts in my chest and it radiates to the shoulder.  This is B___ S____.  A patient here in pain and I can't be respected enough to get a refill.  I see him every three weeks and have to come here twice on my own time to get pain medicine.  Give me enough pills so I do not have to come out here except for the doctors visit.  Today I should be given an appointment time, but will be worked in.  I have seven pills to last for three days."   Reports the pain pills are working to manage his pain.  Advised to stay to wait for F/U by [facility] as he lives in [place] and we have offered an appointment.    Walked off towards [facility] front entrance.  Next scheduled F/U is 09-03-2016.

## 2016-09-03 ENCOUNTER — Ambulatory Visit (HOSPITAL_BASED_OUTPATIENT_CLINIC_OR_DEPARTMENT_OTHER): Payer: Medicaid Other

## 2016-09-03 ENCOUNTER — Telehealth: Payer: Self-pay | Admitting: Internal Medicine

## 2016-09-03 ENCOUNTER — Encounter: Payer: Self-pay | Admitting: Internal Medicine

## 2016-09-03 ENCOUNTER — Other Ambulatory Visit (HOSPITAL_BASED_OUTPATIENT_CLINIC_OR_DEPARTMENT_OTHER): Payer: Medicaid Other

## 2016-09-03 ENCOUNTER — Ambulatory Visit (HOSPITAL_BASED_OUTPATIENT_CLINIC_OR_DEPARTMENT_OTHER): Payer: Medicaid Other | Admitting: Internal Medicine

## 2016-09-03 DIAGNOSIS — G893 Neoplasm related pain (acute) (chronic): Secondary | ICD-10-CM | POA: Diagnosis not present

## 2016-09-03 DIAGNOSIS — C3411 Malignant neoplasm of upper lobe, right bronchus or lung: Secondary | ICD-10-CM

## 2016-09-03 DIAGNOSIS — Z5111 Encounter for antineoplastic chemotherapy: Secondary | ICD-10-CM

## 2016-09-03 DIAGNOSIS — C7951 Secondary malignant neoplasm of bone: Secondary | ICD-10-CM

## 2016-09-03 DIAGNOSIS — Z5112 Encounter for antineoplastic immunotherapy: Secondary | ICD-10-CM

## 2016-09-03 DIAGNOSIS — D701 Agranulocytosis secondary to cancer chemotherapy: Secondary | ICD-10-CM | POA: Diagnosis not present

## 2016-09-03 DIAGNOSIS — R040 Epistaxis: Secondary | ICD-10-CM

## 2016-09-03 DIAGNOSIS — D6481 Anemia due to antineoplastic chemotherapy: Secondary | ICD-10-CM | POA: Diagnosis not present

## 2016-09-03 LAB — CBC WITH DIFFERENTIAL/PLATELET
BASO%: 0.3 % (ref 0.0–2.0)
Basophils Absolute: 0 10*3/uL (ref 0.0–0.1)
EOS%: 0.1 % (ref 0.0–7.0)
Eosinophils Absolute: 0 10*3/uL (ref 0.0–0.5)
HCT: 31.3 % — ABNORMAL LOW (ref 38.4–49.9)
HEMOGLOBIN: 10.2 g/dL — AB (ref 13.0–17.1)
LYMPH%: 19.4 % (ref 14.0–49.0)
MCH: 29.1 pg (ref 27.2–33.4)
MCHC: 32.5 g/dL (ref 32.0–36.0)
MCV: 89.4 fL (ref 79.3–98.0)
MONO#: 0.5 10*3/uL (ref 0.1–0.9)
MONO%: 20.7 % — AB (ref 0.0–14.0)
NEUT%: 59.5 % (ref 39.0–75.0)
NEUTROS ABS: 1.4 10*3/uL — AB (ref 1.5–6.5)
Platelets: 192 10*3/uL (ref 140–400)
RBC: 3.5 10*6/uL — ABNORMAL LOW (ref 4.20–5.82)
RDW: 24.1 % — AB (ref 11.0–14.6)
WBC: 2.3 10*3/uL — AB (ref 4.0–10.3)
lymph#: 0.4 10*3/uL — ABNORMAL LOW (ref 0.9–3.3)

## 2016-09-03 LAB — COMPREHENSIVE METABOLIC PANEL
ALBUMIN: 3.6 g/dL (ref 3.5–5.0)
ALK PHOS: 159 U/L — AB (ref 40–150)
ALT: 21 U/L (ref 0–55)
AST: 20 U/L (ref 5–34)
Anion Gap: 12 mEq/L — ABNORMAL HIGH (ref 3–11)
BILIRUBIN TOTAL: 0.56 mg/dL (ref 0.20–1.20)
BUN: 13.4 mg/dL (ref 7.0–26.0)
CO2: 24 mEq/L (ref 22–29)
Calcium: 10.2 mg/dL (ref 8.4–10.4)
Chloride: 106 mEq/L (ref 98–109)
Creatinine: 0.8 mg/dL (ref 0.7–1.3)
EGFR: >90 mL/min/{1.73_m2} (ref >90)
GLUCOSE: 91 mg/dL (ref 70–140)
POTASSIUM: 3.9 meq/L (ref 3.5–5.1)
SODIUM: 142 meq/L (ref 136–145)
TOTAL PROTEIN: 8.2 g/dL (ref 6.4–8.3)

## 2016-09-03 MED ORDER — SODIUM CHLORIDE 0.9 % IV SOLN
703.0000 mg | Freq: Once | INTRAVENOUS | Status: AC
  Administered 2016-09-03: 700 mg via INTRAVENOUS
  Filled 2016-09-03: qty 70

## 2016-09-03 MED ORDER — PALONOSETRON HCL INJECTION 0.25 MG/5ML
0.2500 mg | Freq: Once | INTRAVENOUS | Status: AC
  Administered 2016-09-03: 0.25 mg via INTRAVENOUS

## 2016-09-03 MED ORDER — SODIUM CHLORIDE 0.9 % IV SOLN: Freq: Once | INTRAVENOUS | Status: DC

## 2016-09-03 MED ORDER — DEXAMETHASONE SODIUM PHOSPHATE 10 MG/ML IJ SOLN
10.0000 mg | Freq: Once | INTRAMUSCULAR | Status: AC
  Administered 2016-09-03: 10 mg via INTRAVENOUS

## 2016-09-03 MED ORDER — SODIUM CHLORIDE 0.9 % IV SOLN
14.0000 mg/kg | Freq: Once | INTRAVENOUS | Status: AC
  Administered 2016-09-03: 1200 mg via INTRAVENOUS
  Filled 2016-09-03: qty 48

## 2016-09-03 MED ORDER — DEXAMETHASONE SODIUM PHOSPHATE 10 MG/ML IJ SOLN
INTRAMUSCULAR | Status: AC
  Filled 2016-09-03: qty 1

## 2016-09-03 MED ORDER — SODIUM CHLORIDE 0.9 % IV SOLN
475.0000 mg/m2 | Freq: Once | INTRAVENOUS | Status: AC
  Administered 2016-09-03: 1000 mg via INTRAVENOUS
  Filled 2016-09-03: qty 40

## 2016-09-03 MED ORDER — PALONOSETRON HCL INJECTION 0.25 MG/5ML
INTRAVENOUS | Status: AC
  Filled 2016-09-03: qty 5

## 2016-09-03 MED ORDER — SODIUM CHLORIDE 0.9 % IV SOLN
Freq: Once | INTRAVENOUS | Status: AC
  Administered 2016-09-03: 12:00:00 via INTRAVENOUS

## 2016-09-03 MED ORDER — OXYCODONE-ACETAMINOPHEN 5-325 MG PO TABS: 1.0000 | ORAL_TABLET | Freq: Four times a day (QID) | ORAL | 0 refills | Status: DC | PRN

## 2016-09-03 MED ORDER — MORPHINE SULFATE ER 30 MG PO TBCR: 30.0000 mg | EXTENDED_RELEASE_TABLET | Freq: Two times a day (BID) | ORAL | 0 refills | Status: DC

## 2016-09-03 MED FILL — OXYCODONE W/APAP 5/325 TAB: 5-325 | 5 days supply | Qty: 45 | Fill #0

## 2016-09-03 MED FILL — MORPHINE SULF ER 30 MG TAB: 30 | 30 days supply | Qty: 60 | Fill #0

## 2016-09-03 NOTE — Telephone Encounter (Signed)
Message sent to the chemo scheduler to be added. Appointments scheduled per 09/03/16 los. A copy of the appointment schedule was mailed to the patient, per 09/03/16 los.

## 2016-09-03 NOTE — Patient Instructions (Signed)
[facility] Discharge Instructions for Patients Receiving Chemotherapy  Today you received the following chemotherapy agents: Avastin, Alimta and Carboplatin   To help prevent nausea and vomiting after your treatment, we encourage you to take your nausea medication as directed.    If you develop nausea and vomiting that is not controlled by your nausea medication, call the clinic.   BELOW ARE SYMPTOMS THAT SHOULD BE REPORTED IMMEDIATELY:  *FEVER GREATER THAN 100.5 F  *CHILLS WITH OR WITHOUT FEVER  NAUSEA AND VOMITING THAT IS NOT CONTROLLED WITH YOUR NAUSEA MEDICATION  *UNUSUAL SHORTNESS OF BREATH  *UNUSUAL BRUISING OR BLEEDING  TENDERNESS IN MOUTH AND THROAT WITH OR WITHOUT PRESENCE OF ULCERS  *URINARY PROBLEMS  *BOWEL PROBLEMS  UNUSUAL RASH Items with * indicate a potential emergency and should be followed up as soon as possible.  Feel free to call the clinic you have any questions or concerns. The clinic phone number is [phone].  Please show the CHEMO ALERT CARD at check-in to the Emergency Department and triage nurse.   

## 2016-09-03 NOTE — Progress Notes (Signed)
OK to treat today with ANC 1.4 per Dr. Julien Nordmann.  IV x 4 attempts today by 3 RNs.  Discussed PAC with pt but he says he only has a few treatments left and will continue to use Peripheral IV for now.  Encouraged pt to drink plenty of fluids prior to next Chemo.

## 2016-09-03 NOTE — Progress Notes (Signed)
[place] [phone]   [phone]  OFFICE PROGRESS NOTE  [name], FNP [address] [place] [address]  DIAGNOSIS: Stage IV (T3, N3, M1 B) non-small cell lung cancer, adenocarcinoma diagnosed in August 2017 and presented with right upper lobe lung nodule as well as large right superior mediastinal and right paratracheal lymphadenopathy and bone metastases in the right iliac and L5 vertebral body.  GUARDANT 360: Negative for EGFR, ALK, ROS 1, BRAF mutations but showed amplification of EGFR and MET  PRIOR THERAPY: Palliative systemic chemotherapy to the right superior mediastinal and right paratracheal lymphadenopathy under the care of Dr. Lisbeth Renshaw.  CURRENT THERAPY: Systemic chemotherapy with carboplatin for AUC of 5, Alimta 500 MG/M2 Monday Avastin 15 MG/KG every 3 weeks. Status post 3 cycles. First dose was given 07/01/2016.  INTERVAL HISTORY: Edward Spence 60 y.o. male returns to the clinic today for follow-up visit. The patient is currently on treatment with carboplatin, Alimta and Avastin status post 3 cycles. He is tolerating his treatment well except for fatigue. He has few episodes of nosebleeds recently. He has been complaining of right sided chest pain that was worse 2 days ago. It is a little bit better today. He has been on treatment with Percocet for his pain management that he takes it at regular basis and his prescription doesn't last more than one week. He gained few pounds since his last visit. He denied having any headache or visual changes. He has no shortness of breath, cough or hemoptysis. He has no nausea or vomiting, no fever or chills. He had repeat CT scan of the chest, abdomen and pelvis performed recently and he is here for evaluation and discussion of his scan results.  MEDICAL HISTORY: Past Medical History:  Diagnosis Date  . Constipation 07/23/2016  . Encounter for antineoplastic chemotherapy 06/24/2016  . Lung  mass 05/19/2016  . Pneumonia    in college    ALLERGIES:  is allergic to penicillins.  MEDICATIONS:  Current Outpatient Prescriptions  Medication Sig Dispense Refill  . dexamethasone (DECADRON) 4 MG tablet 4 mg by mouth twice a day the day before, day of and day after the chemotherapy every 3 weeks 40 tablet 1  . folic acid (FOLVITE) 1 MG tablet Take 1 tablet (1 mg total) by mouth daily. 30 tablet 4  . ondansetron (ZOFRAN ODT) 8 MG disintegrating tablet Take 1 tablet (8 mg total) by mouth every 8 (eight) hours as needed for nausea or vomiting. (Patient not taking: Reported on 08/20/2016) 30 tablet 1  . oxyCODONE (OXY IR/ROXICODONE) 5 MG immediate release tablet Take 1-2 tablets (5-10 mg total) by mouth every 4 (four) hours as needed for severe pain. 45 tablet 0  . oxyCODONE-acetaminophen (PERCOCET/ROXICET) 5-325 MG tablet Take 1-2 tablets by mouth every 6 (six) hours as needed for severe pain. (Patient not taking: Reported on 08/20/2016) 45 tablet 0  . prochlorperazine (COMPAZINE) 10 MG tablet Take 1 tablet (10 mg total) by mouth every 6 (six) hours as needed for nausea or vomiting. 30 tablet 0   No current facility-administered medications for this visit.     SURGICAL HISTORY:  Past Surgical History:  Procedure Laterality Date  . APPENDECTOMY    . VIDEO BRONCHOSCOPY WITH ENDOBRONCHIAL ULTRASOUND N/A 05/22/2016   Procedure: VIDEO BRONCHOSCOPY WITH ENDOBRONCHIAL ULTRASOUND;  Surgeon: [name], MD;  Location: [facility];  Service: Thoracic;  Laterality: N/A;    REVIEW OF SYSTEMS:  Constitutional: positive  for fatigue Eyes: negative for irritation and redness Ears, nose, mouth, throat, and face: positive for epistaxis Respiratory: positive for pleurisy/chest pain Cardiovascular: negative for orthopnea and palpitations Gastrointestinal: negative for constipation, diarrhea, nausea and vomiting Genitourinary:negative for dysuria, frequency and hematuria Integument/breast: negative  for rash Hematologic/lymphatic: positive for bleeding and easy bruising Musculoskeletal:negative for arthralgias and muscle weakness Neurological: negative for seizures, tremors and vertigo Behavioral/Psych: negative Endocrine: negative Allergic/Immunologic: negative   PHYSICAL EXAMINATION: General appearance: alert, cooperative, fatigued and no distress Head: Normocephalic, without obvious abnormality, atraumatic Neck: no adenopathy, no JVD, supple, symmetrical, trachea midline and thyroid not enlarged, symmetric, no tenderness/mass/nodules Lymph nodes: Cervical, supraclavicular, and axillary nodes normal. Resp: clear to auscultation bilaterally Back: symmetric, no curvature. ROM normal. No CVA tenderness. Cardio: regular rate and rhythm, S1, S2 normal, no murmur, click, rub or gallop GI: soft, non-tender; bowel sounds normal; no masses,  no organomegaly Extremities: extremities normal, atraumatic, no cyanosis or edema Neurologic: Alert and oriented X 3, normal strength and tone. Normal symmetric reflexes. Normal coordination and gait  ECOG PERFORMANCE STATUS: 1 - Symptomatic but completely ambulatory  Blood pressure 129/73, pulse 96, temperature 97.9 F (36.6 C), temperature source Oral, resp. rate 18, height '6\' 2"'$  (1.88 m), weight 176 lb (79.8 kg), SpO2 100 %.  LABORATORY DATA: Lab Results  Component Value Date   WBC 2.3 (L) 09/03/2016   HGB 10.2 (L) 09/03/2016   HCT 31.3 (L) 09/03/2016   MCV 89.4 09/03/2016   PLT 192 09/03/2016      Chemistry      Component Value Date/Time   NA 142 09/03/2016 0932   K 3.9 09/03/2016 0932   CL 107 05/22/2016 0641   CO2 24 09/03/2016 0932   BUN 13.4 09/03/2016 0932   CREATININE 0.8 09/03/2016 0932      Component Value Date/Time   CALCIUM 10.2 09/03/2016 0932   ALKPHOS 159 (H) 09/03/2016 0932   AST 20 09/03/2016 0932   ALT 21 09/03/2016 0932   BILITOT 0.56 09/03/2016 0932       RADIOGRAPHIC STUDIES: Ct Chest W  Contrast  Result Date: 09/01/2016 CLINICAL DATA:  60 year old male recently diagnosed with lung cancer in August 2017. Chemotherapy in progress. Radiation therapy complete. Complaint of chest pain for the past 2 months. Nausea and vomiting after chemotherapy. Constipation. EXAM: CT CHEST, ABDOMEN, AND PELVIS WITH CONTRAST TECHNIQUE: Multidetector CT imaging of the chest, abdomen and pelvis was performed following the standard protocol during bolus administration of intravenous contrast. CONTRAST:  122m ISOVUE-300 IOPAMIDOL (ISOVUE-300) INJECTION 61% COMPARISON:  Chest CT 05/14/2016.  PET-CT 05/29/2016. FINDINGS: CT CHEST FINDINGS Cardiovascular: Heart size is normal. There is no significant pericardial fluid, thickening or pericardial calcification. No atherosclerotic calcifications are identified within the thoracic aorta or the coronary arteries. Mediastinum/Nodes: Previously noted bulky mediastinal and hilar lymphadenopathy has significantly regressed. This is best demonstrated by a high right paratracheal nodal mass which currently measures 2.7 x 2.7 cm (previously up to 7.5 x 4.8 cm on prior PET-CT), and by a significantly decreased right hilar lymph node which currently measures 1.6 cm in short axis (previously much larger but difficult to measure secondary to lack of contrast). Esophagus is unremarkable in appearance. No axillary lymphadenopathy. Lungs/Pleura: Previously noted right upper lobe nodule has also decreased in size, currently measuring 10 x 8 mm (image 28 of series 4), as compared with 12 x 14 mm on original scan 05/14/2016. No new suspicious appearing pulmonary nodules or masses. Diffuse bronchial wall thickening with mild centrilobular and  paraseptal emphysema. No acute consolidative airspace disease. No pleural effusions. Musculoskeletal: There are no aggressive appearing lytic or blastic lesions noted in the visualized portions of the skeleton. CT ABDOMEN PELVIS FINDINGS Hepatobiliary: No  suspicious cystic or solid hepatic lesions. No intra or extrahepatic biliary ductal dilatation. Gallbladder is normal in appearance. Pancreas: No pancreatic mass. No pancreatic ductal dilatation. No pancreatic or peripancreatic fluid or inflammatory changes. Spleen: Unremarkable. Adrenals/Urinary Tract: Sub cm low-attenuation lesion in the lower pole of the left kidney is too small to definitively characterize, but is statistically likely a tiny cyst. Right kidney and bilateral adrenal glands are unremarkable in appearance. No hydroureteronephrosis. Urinary bladder is normal in appearance. Stomach/Bowel: Normal appearance of the stomach. No pathologic dilatation of small bowel or colon. Large amount of stool throughout the colon, suggestive of constipation. Normal appendix. Vascular/Lymphatic: Aortic atherosclerosis, without evidence of aneurysm or dissection in the abdominal or pelvic vasculature. No lymphadenopathy noted in the abdomen or pelvis. Reproductive: Prostate gland seminal vesicles are unremarkable in appearance. Other: No significant volume of ascites.  No pneumoperitoneum. Musculoskeletal: Faintly sclerotic 1.8 x 1.2 cm lesion in the right side of L5 corresponds to previously noted hypermetabolic lesion. There is also a mixed lytic and sclerotic ill-defined lesion throughout the posterior aspect of the right ilium adjacent to the sacroiliac joint (image 107 of series 2) which measures approximately 7.0 x 2.2 cm and also corresponds to a previously noted hypermetabolic lesion. IMPRESSION: 1. Today's study demonstrates a positive response to therapy with partial regression of the primary right upper lobe nodule and extensive right hilar and mediastinal lymphadenopathy. Osseous metastatic lesions in L5 and the right ilium are slightly more apparent than the prior PET-CT examination, however, this may simply reflect bony healing secondary to treatment. 2. No new sites of metastatic disease noted elsewhere  in the chest, abdomen or pelvis. 3. Large volume of stool throughout the colon, compatible with the reported clinical history of constipation. 4. Aortic atherosclerosis. 5. Additional incidental findings, as above. Electronically Signed   By: Vinnie Langton M.D.   On: 09/01/2016 10:31   Ct Abdomen Pelvis W Contrast  Result Date: 09/01/2016 CLINICAL DATA:  60 year old male recently diagnosed with lung cancer in August 2017. Chemotherapy in progress. Radiation therapy complete. Complaint of chest pain for the past 2 months. Nausea and vomiting after chemotherapy. Constipation. EXAM: CT CHEST, ABDOMEN, AND PELVIS WITH CONTRAST TECHNIQUE: Multidetector CT imaging of the chest, abdomen and pelvis was performed following the standard protocol during bolus administration of intravenous contrast. CONTRAST:  156m ISOVUE-300 IOPAMIDOL (ISOVUE-300) INJECTION 61% COMPARISON:  Chest CT 05/14/2016.  PET-CT 05/29/2016. FINDINGS: CT CHEST FINDINGS Cardiovascular: Heart size is normal. There is no significant pericardial fluid, thickening or pericardial calcification. No atherosclerotic calcifications are identified within the thoracic aorta or the coronary arteries. Mediastinum/Nodes: Previously noted bulky mediastinal and hilar lymphadenopathy has significantly regressed. This is best demonstrated by a high right paratracheal nodal mass which currently measures 2.7 x 2.7 cm (previously up to 7.5 x 4.8 cm on prior PET-CT), and by a significantly decreased right hilar lymph node which currently measures 1.6 cm in short axis (previously much larger but difficult to measure secondary to lack of contrast). Esophagus is unremarkable in appearance. No axillary lymphadenopathy. Lungs/Pleura: Previously noted right upper lobe nodule has also decreased in size, currently measuring 10 x 8 mm (image 28 of series 4), as compared with 12 x 14 mm on original scan 05/14/2016. No new suspicious appearing pulmonary nodules or masses. Diffuse  bronchial wall thickening with mild centrilobular and paraseptal emphysema. No acute consolidative airspace disease. No pleural effusions. Musculoskeletal: There are no aggressive appearing lytic or blastic lesions noted in the visualized portions of the skeleton. CT ABDOMEN PELVIS FINDINGS Hepatobiliary: No suspicious cystic or solid hepatic lesions. No intra or extrahepatic biliary ductal dilatation. Gallbladder is normal in appearance. Pancreas: No pancreatic mass. No pancreatic ductal dilatation. No pancreatic or peripancreatic fluid or inflammatory changes. Spleen: Unremarkable. Adrenals/Urinary Tract: Sub cm low-attenuation lesion in the lower pole of the left kidney is too small to definitively characterize, but is statistically likely a tiny cyst. Right kidney and bilateral adrenal glands are unremarkable in appearance. No hydroureteronephrosis. Urinary bladder is normal in appearance. Stomach/Bowel: Normal appearance of the stomach. No pathologic dilatation of small bowel or colon. Large amount of stool throughout the colon, suggestive of constipation. Normal appendix. Vascular/Lymphatic: Aortic atherosclerosis, without evidence of aneurysm or dissection in the abdominal or pelvic vasculature. No lymphadenopathy noted in the abdomen or pelvis. Reproductive: Prostate gland seminal vesicles are unremarkable in appearance. Other: No significant volume of ascites.  No pneumoperitoneum. Musculoskeletal: Faintly sclerotic 1.8 x 1.2 cm lesion in the right side of L5 corresponds to previously noted hypermetabolic lesion. There is also a mixed lytic and sclerotic ill-defined lesion throughout the posterior aspect of the right ilium adjacent to the sacroiliac joint (image 107 of series 2) which measures approximately 7.0 x 2.2 cm and also corresponds to a previously noted hypermetabolic lesion. IMPRESSION: 1. Today's study demonstrates a positive response to therapy with partial regression of the primary right upper  lobe nodule and extensive right hilar and mediastinal lymphadenopathy. Osseous metastatic lesions in L5 and the right ilium are slightly more apparent than the prior PET-CT examination, however, this may simply reflect bony healing secondary to treatment. 2. No new sites of metastatic disease noted elsewhere in the chest, abdomen or pelvis. 3. Large volume of stool throughout the colon, compatible with the reported clinical history of constipation. 4. Aortic atherosclerosis. 5. Additional incidental findings, as above. Electronically Signed   By: Vinnie Langton M.D.   On: 09/01/2016 10:31    ASSESSMENT AND PLAN: This is a very pleasant 60 years old white male with: 1) stage IV non-small cell lung cancer, adenocarcinoma. He is currently undergoing systemic chemotherapy with carboplatin, Alimta and Avastin status post 3 cycles. The patient is tolerating his treatment well except for fatigue and occasional epistaxis. He has recent restaging CT scan of the chest, abdomen and pelvis. I personally and independently reviewed the scan images. I discussed the scan results and showed the images to the patient today. It showed significant improvement in his disease with good response in the right upper lobe and right hilar and mediastinal lymphadenopathy as well as healing process and the dose is metastatic disease. I recommended for the patient to continue his current systemic chemotherapy with carboplatin, Alimta and Avastin. He will proceed with cycle #4 today.  2) epistaxis: This is likely secondary to his current treatment with Avastin. We will continue to monitor it closely I advised the patient to use over-the-counter saline Nasal drip to decrease the dryness of the nostrils.  3) right-sided chest pain: His pain is not well controlled with the current regimen of pain medication with Percocet. I recommended for the patient to start treatment with MS Contin 30 mg by mouth every 12 hours. He will continue on  Percocet for breakthrough pain. He was given a refill of MS Contin and Percocet today.  4) chemotherapy-induced anemia: We will continue to monitor this closely and consider the patient for PRBCs transfusion if his hemoglobin is less than 8.0 G/DL.  5) chemotherapy-induced neutropenia: His blood count is good enough for treatment today but we will continue to monitor his neutrophil count on the weekly blood work and if needed we'll consider him for treatment with Granix. The patient would come back for follow-up visit in 3 weeks for evaluation before starting cycle #5 of his treatment.  The patient voices understanding of current disease status and treatment options and is in agreement with the current care plan.  All questions were answered. The patient knows to call the clinic with any problems, questions or concerns. We can certainly see the patient much sooner if necessary.  Disclaimer: This note was dictated with voice recognition software. Similar sounding words can inadvertently be transcribed and may not be corrected upon review.

## 2016-09-04 ENCOUNTER — Telehealth: Payer: Self-pay | Admitting: *Deleted

## 2016-09-04 NOTE — Telephone Encounter (Signed)
Per LOS I have scheduled appts and notified the scheduler 

## 2016-09-10 ENCOUNTER — Other Ambulatory Visit (HOSPITAL_BASED_OUTPATIENT_CLINIC_OR_DEPARTMENT_OTHER): Payer: Medicaid Other

## 2016-09-10 DIAGNOSIS — C3411 Malignant neoplasm of upper lobe, right bronchus or lung: Secondary | ICD-10-CM | POA: Diagnosis not present

## 2016-09-10 LAB — CBC WITH DIFFERENTIAL/PLATELET
BASO%: 1.7 % (ref 0.0–2.0)
Basophils Absolute: 0 10*3/uL (ref 0.0–0.1)
EOS%: 3.3 % (ref 0.0–7.0)
Eosinophils Absolute: 0 10*3/uL (ref 0.0–0.5)
HEMATOCRIT: 28.3 % — AB (ref 38.4–49.9)
HGB: 9.4 g/dL — ABNORMAL LOW (ref 13.0–17.1)
LYMPH#: 0.3 10*3/uL — AB (ref 0.9–3.3)
LYMPH%: 31.7 % (ref 14.0–49.0)
MCH: 30 pg (ref 27.2–33.4)
MCHC: 33.1 g/dL (ref 32.0–36.0)
MCV: 90.7 fL (ref 79.3–98.0)
MONO#: 0.1 10*3/uL (ref 0.1–0.9)
MONO%: 9 % (ref 0.0–14.0)
NEUT%: 54.3 % (ref 39.0–75.0)
NEUTROS ABS: 0.6 10*3/uL — AB (ref 1.5–6.5)
PLATELETS: 196 10*3/uL (ref 140–400)
RBC: 3.12 10*6/uL — ABNORMAL LOW (ref 4.20–5.82)
RDW: 24.4 % — ABNORMAL HIGH (ref 11.0–14.6)
WBC: 1.1 10*3/uL — AB (ref 4.0–10.3)

## 2016-09-10 LAB — COMPREHENSIVE METABOLIC PANEL
ALT: 17 U/L (ref 0–55)
ANION GAP: 10 meq/L (ref 3–11)
AST: 22 U/L (ref 5–34)
Albumin: 3.5 g/dL (ref 3.5–5.0)
Alkaline Phosphatase: 104 U/L (ref 40–150)
BILIRUBIN TOTAL: 0.71 mg/dL (ref 0.20–1.20)
BUN: 17.6 mg/dL (ref 7.0–26.0)
CALCIUM: 9.7 mg/dL (ref 8.4–10.4)
CO2: 24 mEq/L (ref 22–29)
CREATININE: 0.8 mg/dL (ref 0.7–1.3)
Chloride: 105 mEq/L (ref 98–109)
EGFR: >90 mL/min/{1.73_m2} (ref >90)
Glucose: 87 mg/dl (ref 70–140)
Potassium: 4.4 mEq/L (ref 3.5–5.1)
Sodium: 139 mEq/L (ref 136–145)
TOTAL PROTEIN: 7.7 g/dL (ref 6.4–8.3)

## 2016-09-10 LAB — UA PROTEIN, DIPSTICK - CHCC: PROTEIN: NEGATIVE mg/dL

## 2016-09-11 ENCOUNTER — Telehealth: Payer: Self-pay | Admitting: Medical Oncology

## 2016-09-11 NOTE — Telephone Encounter (Signed)
I instructed pt on precautions for neutrapenia and when to call -he voiced understanding with teach back . He refused to come in for granix. "I feel pretty darn good, the best I have felt in a long time". Note to Blanche.

## 2016-09-16 ENCOUNTER — Other Ambulatory Visit: Payer: Self-pay | Admitting: Medical Oncology

## 2016-09-17 ENCOUNTER — Other Ambulatory Visit (HOSPITAL_BASED_OUTPATIENT_CLINIC_OR_DEPARTMENT_OTHER): Payer: Medicaid Other

## 2016-09-17 DIAGNOSIS — C3411 Malignant neoplasm of upper lobe, right bronchus or lung: Secondary | ICD-10-CM | POA: Diagnosis not present

## 2016-09-17 LAB — CBC WITH DIFFERENTIAL/PLATELET
BASO%: 0 % (ref 0.0–2.0)
Basophils Absolute: 0 10*3/uL (ref 0.0–0.1)
EOS%: 0 % (ref 0.0–7.0)
Eosinophils Absolute: 0 10*3/uL (ref 0.0–0.5)
HCT: 24.4 % — ABNORMAL LOW (ref 38.4–49.9)
HGB: 8 g/dL — ABNORMAL LOW (ref 13.0–17.1)
LYMPH%: 24.2 % (ref 14.0–49.0)
MCH: 30 pg (ref 27.2–33.4)
MCHC: 32.8 g/dL (ref 32.0–36.0)
MCV: 91.4 fL (ref 79.3–98.0)
MONO#: 0.2 10*3/uL (ref 0.1–0.9)
MONO%: 16.2 % — AB (ref 0.0–14.0)
NEUT%: 59.6 % (ref 39.0–75.0)
NEUTROS ABS: 0.6 10*3/uL — AB (ref 1.5–6.5)
PLATELETS: 31 10*3/uL — AB (ref 140–400)
RBC: 2.67 10*6/uL — AB (ref 4.20–5.82)
RDW: 22.4 % — AB (ref 11.0–14.6)
WBC: 1 10*3/uL — AB (ref 4.0–10.3)
lymph#: 0.2 10*3/uL — ABNORMAL LOW (ref 0.9–3.3)

## 2016-09-17 LAB — COMPREHENSIVE METABOLIC PANEL
ALT: 29 U/L (ref 0–55)
AST: 31 U/L (ref 5–34)
Albumin: 3.5 g/dL (ref 3.5–5.0)
Alkaline Phosphatase: 83 U/L (ref 40–150)
Anion Gap: 8 mEq/L (ref 3–11)
BILIRUBIN TOTAL: 0.5 mg/dL (ref 0.20–1.20)
BUN: 9.8 mg/dL (ref 7.0–26.0)
CO2: 24 meq/L (ref 22–29)
CREATININE: 0.8 mg/dL (ref 0.7–1.3)
Calcium: 9.7 mg/dL (ref 8.4–10.4)
Chloride: 108 mEq/L (ref 98–109)
EGFR: >90 mL/min/{1.73_m2} (ref >90)
GLUCOSE: 88 mg/dL (ref 70–140)
Potassium: 4.1 mEq/L (ref 3.5–5.1)
SODIUM: 140 meq/L (ref 136–145)
TOTAL PROTEIN: 7.4 g/dL (ref 6.4–8.3)

## 2016-09-18 ENCOUNTER — Telehealth: Payer: Self-pay | Admitting: Medical Oncology

## 2016-09-18 ENCOUNTER — Ambulatory Visit (HOSPITAL_BASED_OUTPATIENT_CLINIC_OR_DEPARTMENT_OTHER): Payer: Medicaid Other

## 2016-09-18 ENCOUNTER — Other Ambulatory Visit: Payer: Self-pay | Admitting: Internal Medicine

## 2016-09-18 ENCOUNTER — Telehealth: Payer: Self-pay | Admitting: Internal Medicine

## 2016-09-18 VITALS — BP 118/58 | HR 98 | Temp 98.0°F | Resp 20

## 2016-09-18 DIAGNOSIS — D702 Other drug-induced agranulocytosis: Secondary | ICD-10-CM

## 2016-09-18 MED ORDER — TBO-FILGRASTIM 480 MCG/0.8ML ~~LOC~~ SOSY
480.0000 ug | PREFILLED_SYRINGE | Freq: Once | SUBCUTANEOUS | Status: AC
  Administered 2016-09-18: 480 ug via SUBCUTANEOUS
  Filled 2016-09-18: qty 0.8

## 2016-09-18 NOTE — Telephone Encounter (Signed)
Injection appointment scheduled for 09/19/16 per 09/18/16 scheduled message. Appointment confirmed with patient. Patient refused copy of appointment schedule.

## 2016-09-18 NOTE — Telephone Encounter (Signed)
Pt notified of low blood counts and need for granix. Pt scheduled for today.

## 2016-09-18 NOTE — Patient Instructions (Signed)
Tbo-Filgrastim injection What is this medicine? TBO-FILGRASTIM (T B O fil GRA stim) is a granulocyte colony-stimulating factor that stimulates the growth of neutrophils, a type of white blood cell important in the body's fight against infection. It is used to reduce the incidence of fever and infection in patients with certain types of cancer who are receiving chemotherapy that affects the bone marrow. COMMON BRAND NAME(S): Granix What should I tell my health care provider before I take this medicine? They need to know if you have any of these conditions: -ongoing radiation therapy -sickle cell anemia -an unusual or allergic reaction to tbo-filgrastim, filgrastim, pegfilgrastim, other medicines, foods, dyes, or preservatives -pregnant or trying to get pregnant -breast-feeding How should I use this medicine? This medicine is for injection under the skin. If you get this medicine at home, you will be taught how to prepare and give this medicine. Refer to the Instructions for Use that come with your medication packaging. Use exactly as directed. Take your medicine at regular intervals. Do not take your medicine more often than directed. It is important that you put your used needles and syringes in a special sharps container. Do not put them in a trash can. If you do not have a sharps container, call your pharmacist or healthcare provider to get one. Talk to your pediatrician regarding the use of this medicine in children. Special care may be needed. What if I miss a dose? It is important not to miss your dose. Call your doctor or health care professional if you miss a dose. What may interact with this medicine? This medicine may interact with the following medications: -medicines that may cause a release of neutrophils, such as lithium What should I watch for while using this medicine? You may need blood work done while you are taking this medicine. What side effects may I notice from receiving  this medicine? Side effects that you should report to your doctor or health care professional as soon as possible: -allergic reactions like skin rash, itching or hives, swelling of the face, lips, or tongue -shortness of breath or breathing problems -fever -pain, redness, or irritation at site where injected -pinpoint red spots on the skin -stomach or side pain, or pain at the shoulder -swelling -tiredness -trouble passing urine Side effects that usually do not require medical attention (report to your doctor or health care professional if they continue or are bothersome): -bone pain -muscle pain Where should I keep my medicine? Keep out of the reach of children. Store in a refrigerator between 2 and 8 degrees C (36 and 46 degrees F). Keep in carton to protect from light. Throw away this medicine if it is left out of the refrigerator for more than 5 consecutive days. Throw away any unused medicine after the expiration date.  2017 [employer]vier/Gold Standard (2015-10-18 12:15:25)

## 2016-09-19 ENCOUNTER — Ambulatory Visit (HOSPITAL_BASED_OUTPATIENT_CLINIC_OR_DEPARTMENT_OTHER): Payer: Medicaid Other

## 2016-09-19 VITALS — BP 107/61 | HR 98 | Temp 98.0°F | Resp 18

## 2016-09-19 DIAGNOSIS — C3411 Malignant neoplasm of upper lobe, right bronchus or lung: Secondary | ICD-10-CM | POA: Diagnosis not present

## 2016-09-19 DIAGNOSIS — D702 Other drug-induced agranulocytosis: Secondary | ICD-10-CM

## 2016-09-19 DIAGNOSIS — D701 Agranulocytosis secondary to cancer chemotherapy: Secondary | ICD-10-CM

## 2016-09-19 MED ORDER — TBO-FILGRASTIM 480 MCG/0.8ML ~~LOC~~ SOSY
480.0000 ug | PREFILLED_SYRINGE | Freq: Once | SUBCUTANEOUS | Status: AC
  Administered 2016-09-19: 480 ug via SUBCUTANEOUS
  Filled 2016-09-19: qty 0.8

## 2016-09-19 NOTE — Patient Instructions (Signed)
Tbo-Filgrastim injection What is this medicine? TBO-FILGRASTIM (T B O fil GRA stim) is a granulocyte colony-stimulating factor that stimulates the growth of neutrophils, a type of white blood cell important in the body's fight against infection. It is used to reduce the incidence of fever and infection in patients with certain types of cancer who are receiving chemotherapy that affects the bone marrow. COMMON BRAND NAME(S): Granix What should I tell my health care provider before I take this medicine? They need to know if you have any of these conditions: -ongoing radiation therapy -sickle cell anemia -an unusual or allergic reaction to tbo-filgrastim, filgrastim, pegfilgrastim, other medicines, foods, dyes, or preservatives -pregnant or trying to get pregnant -breast-feeding How should I use this medicine? This medicine is for injection under the skin. If you get this medicine at home, you will be taught how to prepare and give this medicine. Refer to the Instructions for Use that come with your medication packaging. Use exactly as directed. Take your medicine at regular intervals. Do not take your medicine more often than directed. It is important that you put your used needles and syringes in a special sharps container. Do not put them in a trash can. If you do not have a sharps container, call your pharmacist or healthcare provider to get one. Talk to your pediatrician regarding the use of this medicine in children. Special care may be needed. What if I miss a dose? It is important not to miss your dose. Call your doctor or health care professional if you miss a dose. What may interact with this medicine? This medicine may interact with the following medications: -medicines that may cause a release of neutrophils, such as lithium What should I watch for while using this medicine? You may need blood work done while you are taking this medicine. What side effects may I notice from receiving  this medicine? Side effects that you should report to your doctor or health care professional as soon as possible: -allergic reactions like skin rash, itching or hives, swelling of the face, lips, or tongue -shortness of breath or breathing problems -fever -pain, redness, or irritation at site where injected -pinpoint red spots on the skin -stomach or side pain, or pain at the shoulder -swelling -tiredness -trouble passing urine Side effects that usually do not require medical attention (report to your doctor or health care professional if they continue or are bothersome): -bone pain -muscle pain Where should I keep my medicine? Keep out of the reach of children. Store in a refrigerator between 2 and 8 degrees C (36 and 46 degrees F). Keep in carton to protect from light. Throw away this medicine if it is left out of the refrigerator for more than 5 consecutive days. Throw away any unused medicine after the expiration date.  2017 [employer]vier/Gold Standard (2015-10-18 12:15:25)

## 2016-09-24 ENCOUNTER — Ambulatory Visit (HOSPITAL_BASED_OUTPATIENT_CLINIC_OR_DEPARTMENT_OTHER): Payer: Medicaid Other

## 2016-09-24 ENCOUNTER — Other Ambulatory Visit (HOSPITAL_BASED_OUTPATIENT_CLINIC_OR_DEPARTMENT_OTHER): Payer: Medicaid Other

## 2016-09-24 ENCOUNTER — Ambulatory Visit: Payer: Medicaid Other | Admitting: Nutrition

## 2016-09-24 ENCOUNTER — Encounter: Payer: Self-pay | Admitting: Internal Medicine

## 2016-09-24 ENCOUNTER — Ambulatory Visit (HOSPITAL_BASED_OUTPATIENT_CLINIC_OR_DEPARTMENT_OTHER): Payer: Medicaid Other | Admitting: Internal Medicine

## 2016-09-24 VITALS — BP 133/74 | HR 117 | Temp 98.0°F | Resp 18 | Ht 74.0 in | Wt 170.9 lb

## 2016-09-24 VITALS — BP 124/67 | HR 82 | Resp 17

## 2016-09-24 DIAGNOSIS — D6481 Anemia due to antineoplastic chemotherapy: Secondary | ICD-10-CM

## 2016-09-24 DIAGNOSIS — C3411 Malignant neoplasm of upper lobe, right bronchus or lung: Secondary | ICD-10-CM

## 2016-09-24 DIAGNOSIS — C7951 Secondary malignant neoplasm of bone: Secondary | ICD-10-CM

## 2016-09-24 DIAGNOSIS — G893 Neoplasm related pain (acute) (chronic): Secondary | ICD-10-CM

## 2016-09-24 DIAGNOSIS — Z5112 Encounter for antineoplastic immunotherapy: Secondary | ICD-10-CM

## 2016-09-24 DIAGNOSIS — Z5111 Encounter for antineoplastic chemotherapy: Secondary | ICD-10-CM

## 2016-09-24 DIAGNOSIS — D61810 Antineoplastic chemotherapy induced pancytopenia: Secondary | ICD-10-CM

## 2016-09-24 LAB — COMPREHENSIVE METABOLIC PANEL
ALT: 12 U/L (ref 0–55)
ANION GAP: 11 meq/L (ref 3–11)
AST: 17 U/L (ref 5–34)
Albumin: 3.7 g/dL (ref 3.5–5.0)
Alkaline Phosphatase: 96 U/L (ref 40–150)
BILIRUBIN TOTAL: 0.5 mg/dL (ref 0.20–1.20)
BUN: 10.6 mg/dL (ref 7.0–26.0)
CALCIUM: 10.1 mg/dL (ref 8.4–10.4)
CHLORIDE: 106 meq/L (ref 98–109)
CO2: 24 meq/L (ref 22–29)
CREATININE: 0.9 mg/dL (ref 0.7–1.3)
Glucose: 111 mg/dl (ref 70–140)
Potassium: 3.9 mEq/L (ref 3.5–5.1)
Sodium: 141 mEq/L (ref 136–145)
Total Protein: 8.1 g/dL (ref 6.4–8.3)

## 2016-09-24 LAB — CBC WITH DIFFERENTIAL/PLATELET
BASO%: 0.1 % (ref 0.0–2.0)
BASOS ABS: 0 10*3/uL (ref 0.0–0.1)
EOS ABS: 0 10*3/uL (ref 0.0–0.5)
EOS%: 0 % (ref 0.0–7.0)
HEMATOCRIT: 28 % — AB (ref 38.4–49.9)
HGB: 9 g/dL — ABNORMAL LOW (ref 13.0–17.1)
LYMPH#: 0.5 10*3/uL — AB (ref 0.9–3.3)
LYMPH%: 7.3 % — AB (ref 14.0–49.0)
MCH: 30.8 pg (ref 27.2–33.4)
MCHC: 32.1 g/dL (ref 32.0–36.0)
MCV: 95.9 fL (ref 79.3–98.0)
MONO#: 0.8 10*3/uL (ref 0.1–0.9)
MONO%: 11.3 % (ref 0.0–14.0)
NEUT#: 5.6 10*3/uL (ref 1.5–6.5)
NEUT%: 81.3 % — AB (ref 39.0–75.0)
PLATELETS: 188 10*3/uL (ref 140–400)
RBC: 2.92 10*6/uL — AB (ref 4.20–5.82)
RDW: 25.7 % — ABNORMAL HIGH (ref 11.0–14.6)
WBC: 6.8 10*3/uL (ref 4.0–10.3)

## 2016-09-24 MED ORDER — DEXAMETHASONE SODIUM PHOSPHATE 10 MG/ML IJ SOLN
INTRAMUSCULAR | Status: AC
  Filled 2016-09-24: qty 1

## 2016-09-24 MED ORDER — PALONOSETRON HCL INJECTION 0.25 MG/5ML
0.2500 mg | Freq: Once | INTRAVENOUS | Status: AC
  Administered 2016-09-24: 0.25 mg via INTRAVENOUS

## 2016-09-24 MED ORDER — SODIUM CHLORIDE 0.9 % IV SOLN
1200.0000 mg | Freq: Once | INTRAVENOUS | Status: AC
  Administered 2016-09-24: 1200 mg via INTRAVENOUS
  Filled 2016-09-24: qty 48

## 2016-09-24 MED ORDER — PALONOSETRON HCL INJECTION 0.25 MG/5ML
INTRAVENOUS | Status: AC
  Filled 2016-09-24: qty 5

## 2016-09-24 MED ORDER — SODIUM CHLORIDE 0.9 % IV SOLN
470.0000 mg/m2 | Freq: Once | INTRAVENOUS | Status: AC
  Administered 2016-09-24: 1000 mg via INTRAVENOUS
  Filled 2016-09-24: qty 40

## 2016-09-24 MED ORDER — CARBOPLATIN CHEMO INJECTION 600 MG/60ML
639.0000 mg | Freq: Once | INTRAVENOUS | Status: AC
  Administered 2016-09-24: 640 mg via INTRAVENOUS
  Filled 2016-09-24: qty 64

## 2016-09-24 MED ORDER — SODIUM CHLORIDE 0.9 % IV SOLN
Freq: Once | INTRAVENOUS | Status: AC
  Administered 2016-09-24: 11:00:00 via INTRAVENOUS

## 2016-09-24 MED ORDER — DEXAMETHASONE SODIUM PHOSPHATE 10 MG/ML IJ SOLN
10.0000 mg | Freq: Once | INTRAMUSCULAR | Status: AC
  Administered 2016-09-24: 10 mg via INTRAVENOUS

## 2016-09-24 NOTE — Patient Instructions (Signed)
[facility] Discharge Instructions for Patients Receiving Chemotherapy  Today you received the following chemotherapy agents:  Avastin, Alimta, Carboplatin  To help prevent nausea and vomiting after your treatment, we encourage you to take your nausea medication.   If you develop nausea and vomiting that is not controlled by your nausea medication, call the clinic.   BELOW ARE SYMPTOMS THAT SHOULD BE REPORTED IMMEDIATELY:  *FEVER GREATER THAN 100.5 F  *CHILLS WITH OR WITHOUT FEVER  NAUSEA AND VOMITING THAT IS NOT CONTROLLED WITH YOUR NAUSEA MEDICATION  *UNUSUAL SHORTNESS OF BREATH  *UNUSUAL BRUISING OR BLEEDING  TENDERNESS IN MOUTH AND THROAT WITH OR WITHOUT PRESENCE OF ULCERS  *URINARY PROBLEMS  *BOWEL PROBLEMS  UNUSUAL RASH Items with * indicate a potential emergency and should be followed up as soon as possible.  Feel free to call the clinic you have any questions or concerns. The clinic phone number is [phone].  Please show the CHEMO ALERT CARD at check-in to the Emergency Department and triage nurse.   

## 2016-09-24 NOTE — Progress Notes (Signed)
Nutrition follow-up completed with patient receiving chemotherapy for non-small cell lung cancer. Weight decreased and documented as 170.9 pounds December 27, down from 172.4 pounds November 22. Patient reports poor appetite continues. He has occasional nausea but reports nausea medication does not work. He is trying to eat smaller amounts more often.  Nutrition diagnosis: Severe malnutrition, continues.  Intervention: Reviewed ways for patient to increase calories and protein in small frequent meals and snacks. Recommended patient consume Carnation breakfast essentials or milkshakes as tolerated. Questions were answered.  Teach back method used.  Monitoring, evaluation, goals:  Patient will tolerate adequate calories and protein to minimize weight loss.  Next visit: Wednesday, February 7, during infusion.  **Disclaimer: This note was dictated with voice recognition software. Similar sounding words can inadvertently be transcribed and this note may contain transcription errors which may not have been corrected upon publication of note.**

## 2016-09-24 NOTE — Progress Notes (Signed)
[place] [phone]   [phone]  OFFICE PROGRESS NOTE  [name], FNP [address] [place] [address]  DIAGNOSIS: Stage IV (T3, N3, M1 B) non-small cell lung cancer, adenocarcinoma diagnosed in August 2017 and presented with right upper lobe lung nodule as well as large right superior mediastinal and right paratracheal lymphadenopathy and bone metastases in the right iliac and L5 vertebral body.  GUARDANT 360: Negative for EGFR, ALK, ROS 1, BRAF mutations but showed amplification of EGFR and MET  PRIOR THERAPY: Palliative systemic chemotherapy to the right superior mediastinal and right paratracheal lymphadenopathy under the care of Dr. Lisbeth Renshaw.  CURRENT THERAPY: Systemic chemotherapy with carboplatin for AUC of 5, Alimta 500 MG/M2 and Avastin 15 MG/KG every 3 weeks. Status post 4 cycles. First dose was given 07/01/2016.  INTERVAL HISTORY: Edward Spence 60 y.o. male came to the clinic today for follow-up visit. He is currently undergoing systemic chemotherapy with carboplatin, Alimta and Avastin status post 4 cycles. He is tolerating his treatment well with no specific complaints. He lost around 6 pounds since his last visit. He has some tickling in his throat started recently with some mild cough. He denied having any fever or chills. He denied having any chest pain, shortness of breath, cough or hemoptysis. He is using less of the Percocet. He is here today for evaluation before starting cycle #5.   MEDICAL HISTORY: Past Medical History:  Diagnosis Date  . Constipation 07/23/2016  . Encounter for antineoplastic chemotherapy 06/24/2016  . Lung mass 05/19/2016  . Pneumonia    in college    ALLERGIES:  is allergic to penicillins.  MEDICATIONS:  Current Outpatient Prescriptions  Medication Sig Dispense Refill  . dexamethasone (DECADRON) 4 MG tablet 4 mg by mouth twice a day the day before, day of and day after the chemotherapy every  3 weeks 40 tablet 1  . folic acid (FOLVITE) 1 MG tablet Take 1 tablet (1 mg total) by mouth daily. 30 tablet 4  . morphine (MS CONTIN) 30 MG 12 hr tablet Take 1 tablet (30 mg total) by mouth every 12 (twelve) hours. 60 tablet 0  . oxyCODONE-acetaminophen (PERCOCET/ROXICET) 5-325 MG tablet Take 1-2 tablets by mouth every 6 (six) hours as needed for severe pain. 45 tablet 0  . ondansetron (ZOFRAN ODT) 8 MG disintegrating tablet Take 1 tablet (8 mg total) by mouth every 8 (eight) hours as needed for nausea or vomiting. (Patient not taking: Reported on 09/24/2016) 30 tablet 1  . oxyCODONE (OXY IR/ROXICODONE) 5 MG immediate release tablet Take 1-2 tablets (5-10 mg total) by mouth every 4 (four) hours as needed for severe pain. (Patient not taking: Reported on 09/24/2016) 45 tablet 0  . prochlorperazine (COMPAZINE) 10 MG tablet Take 1 tablet (10 mg total) by mouth every 6 (six) hours as needed for nausea or vomiting. (Patient not taking: Reported on 09/24/2016) 30 tablet 0   No current facility-administered medications for this visit.     SURGICAL HISTORY:  Past Surgical History:  Procedure Laterality Date  . APPENDECTOMY    . VIDEO BRONCHOSCOPY WITH ENDOBRONCHIAL ULTRASOUND N/A 05/22/2016   Procedure: VIDEO BRONCHOSCOPY WITH ENDOBRONCHIAL ULTRASOUND;  Surgeon: [name], MD;  Location: [facility];  Service: Thoracic;  Laterality: N/A;    REVIEW OF SYSTEMS:  A comprehensive review of systems was negative except for: Constitutional: positive for fatigue Ears, nose, mouth, throat, and face: positive for sore throat   PHYSICAL EXAMINATION:  General appearance: alert, cooperative, fatigued and no distress Head: Normocephalic, without obvious abnormality, atraumatic Neck: no adenopathy, no JVD, supple, symmetrical, trachea midline and thyroid not enlarged, symmetric, no tenderness/mass/nodules Lymph nodes: Cervical, supraclavicular, and axillary nodes normal. Resp: clear to auscultation  bilaterally Back: symmetric, no curvature. ROM normal. No CVA tenderness. Cardio: regular rate and rhythm, S1, S2 normal, no murmur, click, rub or gallop GI: soft, non-tender; bowel sounds normal; no masses,  no organomegaly Extremities: extremities normal, atraumatic, no cyanosis or edema  ECOG PERFORMANCE STATUS: 1 - Symptomatic but completely ambulatory  Blood pressure 133/74, pulse (!) 117, temperature 98 F (36.7 C), temperature source Oral, resp. rate 18, height 6' 2"  (1.88 m), weight 170 lb 14.4 oz (77.5 kg), SpO2 99 %.  LABORATORY DATA: Lab Results  Component Value Date   WBC 6.8 09/24/2016   HGB 9.0 (L) 09/24/2016   HCT 28.0 (L) 09/24/2016   MCV 95.9 09/24/2016   PLT 188 09/24/2016      Chemistry      Component Value Date/Time   NA 140 09/17/2016 0908   K 4.1 09/17/2016 0908   CL 107 05/22/2016 0641   CO2 24 09/17/2016 0908   BUN 9.8 09/17/2016 0908   CREATININE 0.8 09/17/2016 0908      Component Value Date/Time   CALCIUM 9.7 09/17/2016 0908   ALKPHOS 83 09/17/2016 0908   AST 31 09/17/2016 0908   ALT 29 09/17/2016 0908   BILITOT 0.50 09/17/2016 0908       RADIOGRAPHIC STUDIES: Ct Chest W Contrast  Result Date: 09/01/2016 CLINICAL DATA:  60 year old male recently diagnosed with lung cancer in August 2017. Chemotherapy in progress. Radiation therapy complete. Complaint of chest pain for the past 2 months. Nausea and vomiting after chemotherapy. Constipation. EXAM: CT CHEST, ABDOMEN, AND PELVIS WITH CONTRAST TECHNIQUE: Multidetector CT imaging of the chest, abdomen and pelvis was performed following the standard protocol during bolus administration of intravenous contrast. CONTRAST:  161m ISOVUE-300 IOPAMIDOL (ISOVUE-300) INJECTION 61% COMPARISON:  Chest CT 05/14/2016.  PET-CT 05/29/2016. FINDINGS: CT CHEST FINDINGS Cardiovascular: Heart size is normal. There is no significant pericardial fluid, thickening or pericardial calcification. No atherosclerotic  calcifications are identified within the thoracic aorta or the coronary arteries. Mediastinum/Nodes: Previously noted bulky mediastinal and hilar lymphadenopathy has significantly regressed. This is best demonstrated by a high right paratracheal nodal mass which currently measures 2.7 x 2.7 cm (previously up to 7.5 x 4.8 cm on prior PET-CT), and by a significantly decreased right hilar lymph node which currently measures 1.6 cm in short axis (previously much larger but difficult to measure secondary to lack of contrast). Esophagus is unremarkable in appearance. No axillary lymphadenopathy. Lungs/Pleura: Previously noted right upper lobe nodule has also decreased in size, currently measuring 10 x 8 mm (image 28 of series 4), as compared with 12 x 14 mm on original scan 05/14/2016. No new suspicious appearing pulmonary nodules or masses. Diffuse bronchial wall thickening with mild centrilobular and paraseptal emphysema. No acute consolidative airspace disease. No pleural effusions. Musculoskeletal: There are no aggressive appearing lytic or blastic lesions noted in the visualized portions of the skeleton. CT ABDOMEN PELVIS FINDINGS Hepatobiliary: No suspicious cystic or solid hepatic lesions. No intra or extrahepatic biliary ductal dilatation. Gallbladder is normal in appearance. Pancreas: No pancreatic mass. No pancreatic ductal dilatation. No pancreatic or peripancreatic fluid or inflammatory changes. Spleen: Unremarkable. Adrenals/Urinary Tract: Sub cm low-attenuation lesion in the lower pole of the left kidney is too small to definitively characterize, but is  statistically likely a tiny cyst. Right kidney and bilateral adrenal glands are unremarkable in appearance. No hydroureteronephrosis. Urinary bladder is normal in appearance. Stomach/Bowel: Normal appearance of the stomach. No pathologic dilatation of small bowel or colon. Large amount of stool throughout the colon, suggestive of constipation. Normal  appendix. Vascular/Lymphatic: Aortic atherosclerosis, without evidence of aneurysm or dissection in the abdominal or pelvic vasculature. No lymphadenopathy noted in the abdomen or pelvis. Reproductive: Prostate gland seminal vesicles are unremarkable in appearance. Other: No significant volume of ascites.  No pneumoperitoneum. Musculoskeletal: Faintly sclerotic 1.8 x 1.2 cm lesion in the right side of L5 corresponds to previously noted hypermetabolic lesion. There is also a mixed lytic and sclerotic ill-defined lesion throughout the posterior aspect of the right ilium adjacent to the sacroiliac joint (image 107 of series 2) which measures approximately 7.0 x 2.2 cm and also corresponds to a previously noted hypermetabolic lesion. IMPRESSION: 1. Today's study demonstrates a positive response to therapy with partial regression of the primary right upper lobe nodule and extensive right hilar and mediastinal lymphadenopathy. Osseous metastatic lesions in L5 and the right ilium are slightly more apparent than the prior PET-CT examination, however, this may simply reflect bony healing secondary to treatment. 2. No new sites of metastatic disease noted elsewhere in the chest, abdomen or pelvis. 3. Large volume of stool throughout the colon, compatible with the reported clinical history of constipation. 4. Aortic atherosclerosis. 5. Additional incidental findings, as above. Electronically Signed   By: Vinnie Langton M.D.   On: 09/01/2016 10:31   Ct Abdomen Pelvis W Contrast  Result Date: 09/01/2016 CLINICAL DATA:  60 year old male recently diagnosed with lung cancer in August 2017. Chemotherapy in progress. Radiation therapy complete. Complaint of chest pain for the past 2 months. Nausea and vomiting after chemotherapy. Constipation. EXAM: CT CHEST, ABDOMEN, AND PELVIS WITH CONTRAST TECHNIQUE: Multidetector CT imaging of the chest, abdomen and pelvis was performed following the standard protocol during bolus  administration of intravenous contrast. CONTRAST:  114m ISOVUE-300 IOPAMIDOL (ISOVUE-300) INJECTION 61% COMPARISON:  Chest CT 05/14/2016.  PET-CT 05/29/2016. FINDINGS: CT CHEST FINDINGS Cardiovascular: Heart size is normal. There is no significant pericardial fluid, thickening or pericardial calcification. No atherosclerotic calcifications are identified within the thoracic aorta or the coronary arteries. Mediastinum/Nodes: Previously noted bulky mediastinal and hilar lymphadenopathy has significantly regressed. This is best demonstrated by a high right paratracheal nodal mass which currently measures 2.7 x 2.7 cm (previously up to 7.5 x 4.8 cm on prior PET-CT), and by a significantly decreased right hilar lymph node which currently measures 1.6 cm in short axis (previously much larger but difficult to measure secondary to lack of contrast). Esophagus is unremarkable in appearance. No axillary lymphadenopathy. Lungs/Pleura: Previously noted right upper lobe nodule has also decreased in size, currently measuring 10 x 8 mm (image 28 of series 4), as compared with 12 x 14 mm on original scan 05/14/2016. No new suspicious appearing pulmonary nodules or masses. Diffuse bronchial wall thickening with mild centrilobular and paraseptal emphysema. No acute consolidative airspace disease. No pleural effusions. Musculoskeletal: There are no aggressive appearing lytic or blastic lesions noted in the visualized portions of the skeleton. CT ABDOMEN PELVIS FINDINGS Hepatobiliary: No suspicious cystic or solid hepatic lesions. No intra or extrahepatic biliary ductal dilatation. Gallbladder is normal in appearance. Pancreas: No pancreatic mass. No pancreatic ductal dilatation. No pancreatic or peripancreatic fluid or inflammatory changes. Spleen: Unremarkable. Adrenals/Urinary Tract: Sub cm low-attenuation lesion in the lower pole of the left kidney is too  small to definitively characterize, but is statistically likely a tiny cyst.  Right kidney and bilateral adrenal glands are unremarkable in appearance. No hydroureteronephrosis. Urinary bladder is normal in appearance. Stomach/Bowel: Normal appearance of the stomach. No pathologic dilatation of small bowel or colon. Large amount of stool throughout the colon, suggestive of constipation. Normal appendix. Vascular/Lymphatic: Aortic atherosclerosis, without evidence of aneurysm or dissection in the abdominal or pelvic vasculature. No lymphadenopathy noted in the abdomen or pelvis. Reproductive: Prostate gland seminal vesicles are unremarkable in appearance. Other: No significant volume of ascites.  No pneumoperitoneum. Musculoskeletal: Faintly sclerotic 1.8 x 1.2 cm lesion in the right side of L5 corresponds to previously noted hypermetabolic lesion. There is also a mixed lytic and sclerotic ill-defined lesion throughout the posterior aspect of the right ilium adjacent to the sacroiliac joint (image 107 of series 2) which measures approximately 7.0 x 2.2 cm and also corresponds to a previously noted hypermetabolic lesion. IMPRESSION: 1. Today's study demonstrates a positive response to therapy with partial regression of the primary right upper lobe nodule and extensive right hilar and mediastinal lymphadenopathy. Osseous metastatic lesions in L5 and the right ilium are slightly more apparent than the prior PET-CT examination, however, this may simply reflect bony healing secondary to treatment. 2. No new sites of metastatic disease noted elsewhere in the chest, abdomen or pelvis. 3. Large volume of stool throughout the colon, compatible with the reported clinical history of constipation. 4. Aortic atherosclerosis. 5. Additional incidental findings, as above. Electronically Signed   By: Vinnie Langton M.D.   On: 09/01/2016 10:31    ASSESSMENT AND PLAN:  This is a very pleasant 60 years old white male with a stage IV non-small cell lung cancer, adenocarcinoma currently undergoing systemic  chemotherapy with carboplatin, Alimta and Avastin status post 4 cycles. The patient is tolerating the treatment well with no concerning complains except for mild fatigue. I recommended for him to proceed with cycle #5 today as scheduled. He has mild sore throat and I strongly recommended for the patient to call immediately if he has any worsening of his condition or if you develop any fever or chills. He will use over-the-counter medication for now. For the chemotherapy-induced anemia, we'll continue to monitor the patient closely and consider him for transfusion if his hemoglobin is less than 8.0 G/DL. For pain management, he will continue his current treatment with MS Contin and Percocet. He may need refill next week. He would come back for follow-up visit in 3 weeks for evaluation before starting cycle #6. He was advised to call immediately if he has any concerning symptoms and interval. The patient voices understanding of current disease status and treatment options and is in agreement with the current care plan.  All questions were answered. The patient knows to call the clinic with any problems, questions or concerns. We can certainly see the patient much sooner if necessary. I spent 10 minutes counseling the patient face to face. The total time spent in the appointment was 15 minutes. Disclaimer: This note was dictated with voice recognition software. Similar sounding words can inadvertently be transcribed and may not be corrected upon review.

## 2016-10-01 ENCOUNTER — Other Ambulatory Visit (HOSPITAL_BASED_OUTPATIENT_CLINIC_OR_DEPARTMENT_OTHER): Payer: Medicaid Other

## 2016-10-01 DIAGNOSIS — C3411 Malignant neoplasm of upper lobe, right bronchus or lung: Secondary | ICD-10-CM | POA: Diagnosis present

## 2016-10-01 LAB — COMPREHENSIVE METABOLIC PANEL
ALT: 23 U/L (ref 0–55)
ANION GAP: 10 meq/L (ref 3–11)
AST: 28 U/L (ref 5–34)
Albumin: 3.8 g/dL (ref 3.5–5.0)
Alkaline Phosphatase: 141 U/L (ref 40–150)
BILIRUBIN TOTAL: 0.74 mg/dL (ref 0.20–1.20)
BUN: 18.8 mg/dL (ref 7.0–26.0)
CO2: 25 meq/L (ref 22–29)
Calcium: 10 mg/dL (ref 8.4–10.4)
Chloride: 104 mEq/L (ref 98–109)
Creatinine: 0.8 mg/dL (ref 0.7–1.3)
EGFR: >90 mL/min/{1.73_m2} (ref >90)
Glucose: 90 mg/dl (ref 70–140)
Potassium: 4.2 mEq/L (ref 3.5–5.1)
Sodium: 138 mEq/L (ref 136–145)
TOTAL PROTEIN: 7.7 g/dL (ref 6.4–8.3)

## 2016-10-01 LAB — CBC WITH DIFFERENTIAL/PLATELET
BASO%: 0.7 % (ref 0.0–2.0)
Basophils Absolute: 0 10*3/uL (ref 0.0–0.1)
EOS%: 2 % (ref 0.0–7.0)
Eosinophils Absolute: 0 10*3/uL (ref 0.0–0.5)
HEMATOCRIT: 25.8 % — AB (ref 38.4–49.9)
HEMOGLOBIN: 8.3 g/dL — AB (ref 13.0–17.1)
LYMPH#: 0.3 10*3/uL — AB (ref 0.9–3.3)
LYMPH%: 19.9 % (ref 14.0–49.0)
MCH: 31.1 pg (ref 27.2–33.4)
MCHC: 32.2 g/dL (ref 32.0–36.0)
MCV: 96.6 fL (ref 79.3–98.0)
MONO#: 0.1 10*3/uL (ref 0.1–0.9)
MONO%: 8.6 % (ref 0.0–14.0)
NEUT%: 68.8 % (ref 39.0–75.0)
NEUTROS ABS: 1 10*3/uL — AB (ref 1.5–6.5)
PLATELETS: 154 10*3/uL (ref 140–400)
RBC: 2.67 10*6/uL — AB (ref 4.20–5.82)
RDW: 24.4 % — AB (ref 11.0–14.6)
WBC: 1.5 10*3/uL — AB (ref 4.0–10.3)

## 2016-10-01 LAB — UA PROTEIN, DIPSTICK - CHCC: Protein, ur: <30 mg/dL

## 2016-10-08 ENCOUNTER — Ambulatory Visit (HOSPITAL_BASED_OUTPATIENT_CLINIC_OR_DEPARTMENT_OTHER): Payer: Medicaid Other | Admitting: Nurse Practitioner

## 2016-10-08 ENCOUNTER — Ambulatory Visit (HOSPITAL_COMMUNITY)
Admission: RE | Admit: 2016-10-08 | Discharge: 2016-10-08 | Disposition: A | Discharge status: Home / Self Care | Payer: Medicaid Other | Source: Ambulatory Visit | Attending: Internal Medicine | Admitting: Internal Medicine

## 2016-10-08 ENCOUNTER — Telehealth: Payer: Self-pay | Admitting: Medical Oncology

## 2016-10-08 ENCOUNTER — Other Ambulatory Visit (HOSPITAL_BASED_OUTPATIENT_CLINIC_OR_DEPARTMENT_OTHER): Payer: Medicaid Other

## 2016-10-08 ENCOUNTER — Ambulatory Visit: Payer: Medicaid Other

## 2016-10-08 ENCOUNTER — Other Ambulatory Visit: Payer: Self-pay | Admitting: Medical Oncology

## 2016-10-08 ENCOUNTER — Encounter: Payer: Self-pay | Admitting: Nurse Practitioner

## 2016-10-08 VITALS — BP 92/49 | HR 106 | Temp 98.5°F | Resp 18 | Ht 74.0 in | Wt 167.3 lb

## 2016-10-08 DIAGNOSIS — C3411 Malignant neoplasm of upper lobe, right bronchus or lung: Secondary | ICD-10-CM | POA: Diagnosis not present

## 2016-10-08 DIAGNOSIS — D61810 Antineoplastic chemotherapy induced pancytopenia: Secondary | ICD-10-CM

## 2016-10-08 DIAGNOSIS — D649 Anemia, unspecified: Secondary | ICD-10-CM | POA: Diagnosis present

## 2016-10-08 DIAGNOSIS — Z5111 Encounter for antineoplastic chemotherapy: Secondary | ICD-10-CM

## 2016-10-08 DIAGNOSIS — G893 Neoplasm related pain (acute) (chronic): Secondary | ICD-10-CM | POA: Diagnosis not present

## 2016-10-08 LAB — COMPREHENSIVE METABOLIC PANEL
ALT: 10 U/L (ref 0–55)
AST: 17 U/L (ref 5–34)
Albumin: 3.3 g/dL — ABNORMAL LOW (ref 3.5–5.0)
Alkaline Phosphatase: 89 U/L (ref 40–150)
Anion Gap: 11 mEq/L (ref 3–11)
BUN: 13.3 mg/dL (ref 7.0–26.0)
CALCIUM: 9.8 mg/dL (ref 8.4–10.4)
CHLORIDE: 105 meq/L (ref 98–109)
CO2: 24 meq/L (ref 22–29)
CREATININE: 0.9 mg/dL (ref 0.7–1.3)
EGFR: >90 mL/min/{1.73_m2} (ref >90)
GLUCOSE: 99 mg/dL (ref 70–140)
Potassium: 4.3 mEq/L (ref 3.5–5.1)
Sodium: 140 mEq/L (ref 136–145)
Total Bilirubin: 0.52 mg/dL (ref 0.20–1.20)
Total Protein: 7.5 g/dL (ref 6.4–8.3)

## 2016-10-08 LAB — CBC WITH DIFFERENTIAL/PLATELET
BASO%: 0.3 % (ref 0.0–2.0)
Basophils Absolute: 0 10*3/uL (ref 0.0–0.1)
EOS%: 0 % (ref 0.0–7.0)
Eosinophils Absolute: 0 10*3/uL (ref 0.0–0.5)
HEMATOCRIT: 20.5 % — AB (ref 38.4–49.9)
HGB: 6.6 g/dL — CL (ref 13.0–17.1)
LYMPH%: 8.5 % — AB (ref 14.0–49.0)
MCH: 31.7 pg (ref 27.2–33.4)
MCHC: 32.2 g/dL (ref 32.0–36.0)
MCV: 98.6 fL — ABNORMAL HIGH (ref 79.3–98.0)
MONO#: 0.7 10*3/uL (ref 0.1–0.9)
MONO%: 17.9 % — ABNORMAL HIGH (ref 0.0–14.0)
NEUT%: 73.3 % (ref 39.0–75.0)
NEUTROS ABS: 2.7 10*3/uL (ref 1.5–6.5)
Platelets: 44 10*3/uL — ABNORMAL LOW (ref 140–400)
RBC: 2.08 10*6/uL — ABNORMAL LOW (ref 4.20–5.82)
RDW: 24.1 % — ABNORMAL HIGH (ref 11.0–14.6)
WBC: 3.6 10*3/uL — AB (ref 4.0–10.3)
lymph#: 0.3 10*3/uL — ABNORMAL LOW (ref 0.9–3.3)
nRBC: 0 % (ref 0–0)

## 2016-10-08 LAB — ABO/RH: ABO/RH(D): A POS

## 2016-10-08 LAB — PREPARE RBC (CROSSMATCH)

## 2016-10-08 MED ORDER — OXYCODONE HCL 5 MG PO TABS: 5.0000 mg | ORAL_TABLET | ORAL | 0 refills | Status: DC | PRN

## 2016-10-08 MED ORDER — DIPHENHYDRAMINE HCL 25 MG PO CAPS
25.0000 mg | ORAL_CAPSULE | Freq: Once | ORAL | Status: AC
  Administered 2016-10-08: 25 mg via ORAL
  Filled 2016-10-08: qty 1

## 2016-10-08 MED ORDER — SODIUM CHLORIDE 0.9 % IV SOLN
250.0000 mL | Freq: Once | INTRAVENOUS | Status: AC
  Administered 2016-10-08: 250 mL via INTRAVENOUS

## 2016-10-08 MED ORDER — MORPHINE SULFATE ER 30 MG PO TBCR: 30.0000 mg | EXTENDED_RELEASE_TABLET | Freq: Two times a day (BID) | ORAL | 0 refills | Status: DC

## 2016-10-08 MED ORDER — SODIUM CHLORIDE 0.9% FLUSH: 10.0000 mL | INTRAVENOUS | Status: DC | PRN

## 2016-10-08 MED ORDER — ACETAMINOPHEN 325 MG PO TABS
650.0000 mg | ORAL_TABLET | Freq: Once | ORAL | Status: AC
  Administered 2016-10-08: 650 mg via ORAL
  Filled 2016-10-08: qty 2

## 2016-10-08 MED ORDER — HEPARIN SOD (PORK) LOCK FLUSH 100 UNIT/ML IV SOLN: 500.0000 [IU] | Freq: Every day | INTRAVENOUS | Status: DC | PRN

## 2016-10-08 MED FILL — oxyCODONE HCL 5 MG TABS: 5 | 4 days supply | Qty: 45 | Fill #0

## 2016-10-08 MED FILL — MORPHINE SULF ER 30 MG TAB: 30 | 30 days supply | Qty: 60 | Fill #0

## 2016-10-08 NOTE — Progress Notes (Signed)
[facility]NA[facility]EMENT [facility]    Chief Complaint: Anemia, thrombocytopenia HPI:  Edward Spence 61 y.o. male diagnosed with lung cancer.  Currently undergoing carboplatin/Alimta/Avastin chemotherapy therapy regimen.    No history exists.    Review of Systems  Constitutional: Positive for malaise/fatigue.  HENT: Positive for nosebleeds.   Respiratory: Positive for shortness of breath.   All other systems reviewed and are negative.   Past Medical History:  Diagnosis Date  . Constipation 07/23/2016  . Encounter for antineoplastic chemotherapy 06/24/2016  . Lung mass 05/19/2016  . Pneumonia    in college    Past Surgical History:  Procedure Laterality Date  . APPENDECTOMY    . VIDEO BRONCHOSCOPY WITH ENDOBRONCHIAL ULTRASOUND N/A 05/22/2016   Procedure: VIDEO BRONCHOSCOPY WITH ENDOBRONCHIAL ULTRASOUND;  Surgeon: [name], MD;  Location: [facility];  Service: Thoracic;  Laterality: N/A;    has Lung mass; Malignant neoplasm of bronchus of right upper lobe ([facility]); Osseous metastasis ([facility]); Encounter for antineoplastic chemotherapy; Dehydration; Nausea without vomiting; Dysphagia; Rash; Antineoplastic chemotherapy induced pancytopenia (CODE) ([facility]); Constipation; and Neutropenia, drug-induced ([facility]) on his problem list.    is allergic to penicillins.  Allergies as of 10/08/2016      Reactions   Penicillins Hives   Had penicillin it at age 54 and " I got trench mouth and hives "      Medication List       Accurate as of 10/08/16 12:35 PM. Always use your most recent med list.          dexamethasone 4 MG tablet Commonly known as:  DECADRON 4 mg by mouth twice a day the day before, day of and day after the chemotherapy every 3 weeks   folic acid 1 MG tablet Commonly known as:  FOLVITE Take 1 tablet (1 mg total) by mouth daily.   morphine 30 MG 12 hr tablet Commonly known as:  MS CONTIN Take 1 tablet (30 mg total) by mouth every 12 (twelve) hours.   ondansetron 8  MG disintegrating tablet Commonly known as:  ZOFRAN ODT Take 1 tablet (8 mg total) by mouth every 8 (eight) hours as needed for nausea or vomiting.   oxyCODONE 5 MG immediate release tablet Commonly known as:  Oxy IR/ROXICODONE Take 1-2 tablets (5-10 mg total) by mouth every 4 (four) hours as needed for severe pain.   oxyCODONE-acetaminophen 5-325 MG tablet Commonly known as:  PERCOCET/ROXICET Take 1-2 tablets by mouth every 6 (six) hours as needed for severe pain.   prochlorperazine 10 MG tablet Commonly known as:  COMPAZINE Take 1 tablet (10 mg total) by mouth every 6 (six) hours as needed for nausea or vomiting.        PHYSICAL EXAMINATION  Oncology Vitals 10/08/2016 10/08/2016  Height - -  Weight - -  Weight (lbs) - -  BMI (kg/m2) - -  Temp 98.1 97.4  Pulse 91 91  Resp 18 18  SpO2 98 100  BSA (m2) - -   BP Readings from Last 2 Encounters:  10/08/16 (!) 102/53  10/08/16 (!) 92/49    Physical Exam  Constitutional: He is oriented to person, place, and time. He appears unhealthy.  HENT:  Head: Normocephalic and atraumatic.  Mouth/Throat: Oropharynx is clear and moist.  Eyes: Conjunctivae and EOM are normal. Pupils are equal, round, and reactive to light. Right eye exhibits no discharge. Left eye exhibits no discharge. No scleral icterus.  Neck: Normal range of motion. Neck supple. No JVD present. No  tracheal deviation present. No thyromegaly present.  Cardiovascular: Normal rate, regular rhythm, normal heart sounds and intact distal pulses.   Pulmonary/Chest: Effort normal and breath sounds normal. No respiratory distress. He has no wheezes. He has no rales. He exhibits no tenderness.  Abdominal: Soft. Bowel sounds are normal. He exhibits no distension and no mass. There is no tenderness. There is no rebound and no guarding.  Musculoskeletal: Normal range of motion. He exhibits no edema, tenderness or deformity.  Lymphadenopathy:    He has no cervical adenopathy.    Neurological: He is alert and oriented to person, place, and time. Gait normal.  Skin: Skin is warm and dry. No rash noted. No erythema. There is pallor.  Psychiatric: Affect normal.  Nursing note and vitals reviewed.   LABORATORY DATA:. Hospital Outpatient Visit on 10/08/2016  Component Date Value Ref Range Status  . Order Confirmation 10/08/2016 ORDER PROCESSED BY BLOOD BANK   Final  . ABO/RH(D) 10/08/2016 A POS   Final  . Antibody Screen 10/08/2016 NEG   Final  . Sample Expiration 10/08/2016 10/11/2016   Final  . Unit Number 10/08/2016 [id number]   Final  . Blood Component Type 10/08/2016 RED CELLS,LR   Final  . Unit division 10/08/2016 00   Final  . Status of Unit 10/08/2016 ISSUED   Final  . Transfusion Status 10/08/2016 OK TO TRANSFUSE   Final  . Crossmatch Result 10/08/2016 Compatible   Final  . Unit Number 10/08/2016 [id number]   Final  . Blood Component Type 10/08/2016 RED CELLS,LR   Final  . Unit division 10/08/2016 00   Final  . Status of Unit 10/08/2016 ISSUED   Final  . Transfusion Status 10/08/2016 OK TO TRANSFUSE   Final  . Crossmatch Result 10/08/2016 Compatible   Final  . ABO/RH(D) 10/08/2016 A POS   Final  Appointment on 10/08/2016  Component Date Value Ref Range Status  . WBC 10/08/2016 3.6* 4.0 - 10.3 10e3/uL Final  . NEUT# 10/08/2016 2.7  1.5 - 6.5 10e3/uL Final  . HGB 10/08/2016 6.6* 13.0 - 17.1 g/dL Final  . HCT 10/08/2016 20.5* 38.4 - 49.9 % Final  . Platelets 10/08/2016 44* 140 - 400 10e3/uL Final  . MCV 10/08/2016 98.6* 79.3 - 98.0 fL Final  . MCH 10/08/2016 31.7  27.2 - 33.4 pg Final  . MCHC 10/08/2016 32.2  32.0 - 36.0 g/dL Final  . RBC 10/08/2016 2.08* 4.20 - 5.82 10e6/uL Final  . RDW 10/08/2016 24.1* 11.0 - 14.6 % Final  . lymph# 10/08/2016 0.3* 0.9 - 3.3 10e3/uL Final  . MONO# 10/08/2016 0.7  0.1 - 0.9 10e3/uL Final  . Eosinophils Absolute 10/08/2016 0.0  0.0 - 0.5 10e3/uL Final  . Basophils Absolute 10/08/2016 0.0  0.0 - 0.1 10e3/uL  Final  . NEUT% 10/08/2016 73.3  39.0 - 75.0 % Final  . LYMPH% 10/08/2016 8.5* 14.0 - 49.0 % Final  . MONO% 10/08/2016 17.9* 0.0 - 14.0 % Final  . EOS% 10/08/2016 0.0  0.0 - 7.0 % Final  . BASO% 10/08/2016 0.3  0.0 - 2.0 % Final  . nRBC 10/08/2016 0  0 - 0 % Final  . Sodium 10/08/2016 140  136 - 145 mEq/L Final  . Potassium 10/08/2016 4.3  3.5 - 5.1 mEq/L Final  . Chloride 10/08/2016 105  98 - 109 mEq/L Final  . CO2 10/08/2016 24  22 - 29 mEq/L Final  . Glucose 10/08/2016 99  70 - 140 mg/dl Final  . BUN 10/08/2016 13.3  7.0 - 26.0 mg/dL Final  . Creatinine 10/08/2016 0.9  0.7 - 1.3 mg/dL Final  . Total Bilirubin 10/08/2016 0.52  0.20 - 1.20 mg/dL Final  . Alkaline Phosphatase 10/08/2016 89  40 - 150 U/L Final  . AST 10/08/2016 17  5 - 34 U/L Final  . ALT 10/08/2016 10  0 - 55 U/L Final  . Total Protein 10/08/2016 7.5  6.4 - 8.3 g/dL Final  . Albumin 10/08/2016 3.3* 3.5 - 5.0 g/dL Final  . Calcium 10/08/2016 9.8  8.4 - 10.4 mg/dL Final  . Anion Gap 10/08/2016 11  3 - 11 mEq/L Final  . EGFR 10/08/2016 >90  >90 ml/min/1.73 m2 Final    RADIOGRAPHIC STUDIES: No results found.  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  He is scheduled to return on 10/15/2016 for labs, visit, his next cycle of chemotherapy.  Antineoplastic chemotherapy induced pancytopenia (CODE) (HCC) Patient's labs obtained today reveal blood count with WBC 3.6, ANC 2.7, hemoglobin has decreased from 8.3 down to 6.6, and platelet count has decreased down to 44.  Patient states that he is symptomatic today with increased fatigue and mild shortness of breath with exertion.  He also has some dizziness when he stands up quickly.  He has noticed intermittent bloody nose as well.  He denies any other issues with bleeding.  He denies any recent fevers or chills.  Patient will receive 2 units packed red  blood cells today at the cancer center.  He was also advised to avoid blowing his nose very hard.  He should apply pressure for any nosebleeds.  He was also given some Aquaphor to keep inside his nose moisturized.  Also, patient was given refills of both his MS Contin and his oxycodone today per his request.   Patient stated understanding of all instructions; and was in agreement with this plan of care. The patient knows to call the clinic with any problems, questions or concerns.   Total time spent with patient was 25 minutes;  with greater than 75 percent of that time spent in face to face counseling regarding patient's symptoms,  and coordination of care and follow up.  Disclaimer:This dictation was prepared with Dragon/digital dictation along with [employer]. Any transcriptional errors that result from this process are unintentional.  Drue Second, NP 10/08/2016

## 2016-10-08 NOTE — Assessment & Plan Note (Signed)
Patient's labs obtained today reveal blood count with WBC 3.6, ANC 2.7, hemoglobin has decreased from 8.3 down to 6.6, and platelet count has decreased down to 44.  Patient states that he is symptomatic today with increased fatigue and mild shortness of breath with exertion.  He also has some dizziness when he stands up quickly.  He has noticed intermittent bloody nose as well.  He denies any other issues with bleeding.  He denies any recent fevers or chills.  Patient will receive 2 units packed red blood cells today at the cancer center.  He was also advised to avoid blowing his nose very hard.  He should apply pressure for any nosebleeds.  He was also given some Aquaphor to keep inside his nose moisturized.  Also, patient was given refills of both his MS Contin and his oxycodone today per his request.

## 2016-10-08 NOTE — Discharge Instructions (Signed)

## 2016-10-08 NOTE — Telephone Encounter (Signed)
Symptomatic from low hgb. Added to [facility]. Type and cross ordered and transfusion.

## 2016-10-08 NOTE — Assessment & Plan Note (Signed)
Patient received cycle 5 of his carboplatin/Alimta/Avastin chemotherapy on 09/24/2016.  He is scheduled to return on 10/15/2016 for labs, visit, his next cycle of chemotherapy.

## 2016-10-08 NOTE — Progress Notes (Signed)
@  1015 pt transported to [place] for blood transfusion via w/c per Edwin Cap, NT

## 2016-10-09 LAB — TYPE AND SCREEN
BLOOD PRODUCT EXPIRATION DATE: 201801172359
Blood Product Expiration Date: 201801172359
ISSUE DATE / TIME: 201801101019
ISSUE DATE / TIME: 201801101019
UNIT TYPE AND RH: 6200
UNIT TYPE AND RH: 6200

## 2016-10-15 ENCOUNTER — Other Ambulatory Visit (HOSPITAL_BASED_OUTPATIENT_CLINIC_OR_DEPARTMENT_OTHER): Payer: Medicaid Other

## 2016-10-15 ENCOUNTER — Encounter: Payer: Self-pay | Admitting: Internal Medicine

## 2016-10-15 ENCOUNTER — Ambulatory Visit (HOSPITAL_BASED_OUTPATIENT_CLINIC_OR_DEPARTMENT_OTHER): Payer: Medicaid Other

## 2016-10-15 ENCOUNTER — Ambulatory Visit (HOSPITAL_BASED_OUTPATIENT_CLINIC_OR_DEPARTMENT_OTHER): Payer: Medicaid Other | Admitting: Internal Medicine

## 2016-10-15 VITALS — HR 88

## 2016-10-15 VITALS — BP 130/61 | HR 107 | Temp 98.0°F | Resp 18 | Ht 74.0 in | Wt 169.6 lb

## 2016-10-15 DIAGNOSIS — D701 Agranulocytosis secondary to cancer chemotherapy: Secondary | ICD-10-CM

## 2016-10-15 DIAGNOSIS — Z5189 Encounter for other specified aftercare: Secondary | ICD-10-CM

## 2016-10-15 DIAGNOSIS — C7951 Secondary malignant neoplasm of bone: Secondary | ICD-10-CM | POA: Diagnosis not present

## 2016-10-15 DIAGNOSIS — Z5112 Encounter for antineoplastic immunotherapy: Secondary | ICD-10-CM | POA: Diagnosis not present

## 2016-10-15 DIAGNOSIS — D6481 Anemia due to antineoplastic chemotherapy: Secondary | ICD-10-CM | POA: Diagnosis not present

## 2016-10-15 DIAGNOSIS — Z5111 Encounter for antineoplastic chemotherapy: Secondary | ICD-10-CM

## 2016-10-15 DIAGNOSIS — C3411 Malignant neoplasm of upper lobe, right bronchus or lung: Secondary | ICD-10-CM

## 2016-10-15 DIAGNOSIS — D6959 Other secondary thrombocytopenia: Secondary | ICD-10-CM | POA: Diagnosis not present

## 2016-10-15 DIAGNOSIS — G893 Neoplasm related pain (acute) (chronic): Secondary | ICD-10-CM | POA: Diagnosis not present

## 2016-10-15 LAB — CBC WITH DIFFERENTIAL/PLATELET
BASO%: 0 % (ref 0.0–2.0)
Basophils Absolute: 0 10*3/uL (ref 0.0–0.1)
EOS%: 0 % (ref 0.0–7.0)
Eosinophils Absolute: 0 10*3/uL (ref 0.0–0.5)
HCT: 32.2 % — ABNORMAL LOW (ref 38.4–49.9)
HGB: 10 g/dL — ABNORMAL LOW (ref 13.0–17.1)
LYMPH%: 19.4 % (ref 14.0–49.0)
MCH: 30.8 pg (ref 27.2–33.4)
MCHC: 31.1 g/dL — AB (ref 32.0–36.0)
MCV: 99.1 fL — ABNORMAL HIGH (ref 79.3–98.0)
MONO#: 0.5 10*3/uL (ref 0.1–0.9)
MONO%: 19 % — AB (ref 0.0–14.0)
NEUT%: 61.6 % (ref 39.0–75.0)
NEUTROS ABS: 1.6 10*3/uL (ref 1.5–6.5)
Platelets: 232 10*3/uL (ref 140–400)
RBC: 3.25 10*6/uL — AB (ref 4.20–5.82)
RDW: 22.1 % — ABNORMAL HIGH (ref 11.0–14.6)
WBC: 2.5 10*3/uL — AB (ref 4.0–10.3)
lymph#: 0.5 10*3/uL — ABNORMAL LOW (ref 0.9–3.3)

## 2016-10-15 LAB — COMPREHENSIVE METABOLIC PANEL
ALT: 13 U/L (ref 0–55)
AST: 21 U/L (ref 5–34)
Albumin: 3.5 g/dL (ref 3.5–5.0)
Alkaline Phosphatase: 76 U/L (ref 40–150)
Anion Gap: 12 mEq/L — ABNORMAL HIGH (ref 3–11)
BUN: 11.3 mg/dL (ref 7.0–26.0)
CHLORIDE: 105 meq/L (ref 98–109)
CO2: 23 mEq/L (ref 22–29)
CREATININE: 0.8 mg/dL (ref 0.7–1.3)
Calcium: 10.2 mg/dL (ref 8.4–10.4)
EGFR: >90 mL/min/{1.73_m2} (ref >90)
GLUCOSE: 101 mg/dL (ref 70–140)
Potassium: 4.3 mEq/L (ref 3.5–5.1)
SODIUM: 140 meq/L (ref 136–145)
TOTAL PROTEIN: 8 g/dL (ref 6.4–8.3)
Total Bilirubin: 0.41 mg/dL (ref 0.20–1.20)

## 2016-10-15 MED ORDER — CARBOPLATIN CHEMO INJECTION 600 MG/60ML
639.0000 mg | Freq: Once | INTRAVENOUS | Status: AC
  Administered 2016-10-15: 640 mg via INTRAVENOUS
  Filled 2016-10-15: qty 64

## 2016-10-15 MED ORDER — CYANOCOBALAMIN 1000 MCG/ML IJ SOLN
1000.0000 ug | Freq: Once | INTRAMUSCULAR | Status: AC
  Administered 2016-10-15: 1000 ug via INTRAMUSCULAR

## 2016-10-15 MED ORDER — SODIUM CHLORIDE 0.9 % IV SOLN
Freq: Once | INTRAVENOUS | Status: AC
  Administered 2016-10-15: 11:00:00 via INTRAVENOUS

## 2016-10-15 MED ORDER — PEGFILGRASTIM 6 MG/0.6ML ~~LOC~~ PSKT
6.0000 mg | PREFILLED_SYRINGE | Freq: Once | SUBCUTANEOUS | Status: AC
  Administered 2016-10-15: 6 mg via SUBCUTANEOUS
  Filled 2016-10-15: qty 0.6

## 2016-10-15 MED ORDER — DEXAMETHASONE SODIUM PHOSPHATE 10 MG/ML IJ SOLN
10.0000 mg | Freq: Once | INTRAMUSCULAR | Status: AC
  Administered 2016-10-15: 10 mg via INTRAVENOUS

## 2016-10-15 MED ORDER — PALONOSETRON HCL INJECTION 0.25 MG/5ML
0.2500 mg | Freq: Once | INTRAVENOUS | Status: AC
  Administered 2016-10-15: 0.25 mg via INTRAVENOUS

## 2016-10-15 MED ORDER — DEXAMETHASONE SODIUM PHOSPHATE 10 MG/ML IJ SOLN
INTRAMUSCULAR | Status: AC
  Filled 2016-10-15: qty 1

## 2016-10-15 MED ORDER — SODIUM CHLORIDE 0.9 % IV SOLN
475.0000 mg/m2 | Freq: Once | INTRAVENOUS | Status: AC
  Administered 2016-10-15: 1000 mg via INTRAVENOUS
  Filled 2016-10-15: qty 40

## 2016-10-15 MED ORDER — CYANOCOBALAMIN 1000 MCG/ML IJ SOLN
INTRAMUSCULAR | Status: AC
  Filled 2016-10-15: qty 1

## 2016-10-15 MED ORDER — SODIUM CHLORIDE 0.9 % IV SOLN
14.1000 mg/kg | Freq: Once | INTRAVENOUS | Status: AC
  Administered 2016-10-15: 1200 mg via INTRAVENOUS
  Filled 2016-10-15: qty 48

## 2016-10-15 MED ORDER — PALONOSETRON HCL INJECTION 0.25 MG/5ML
INTRAVENOUS | Status: AC
  Filled 2016-10-15: qty 5

## 2016-10-15 NOTE — Progress Notes (Signed)
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  GUARDANT 360: Negative for EGFR, ALK, ROS 1, BRAF mutations but showed amplification of EGFR and MET  PRIOR THERAPY: Palliative systemic chemotherapy to the right superior mediastinal and right paratracheal lymphadenopathy under the care of Dr. Lisbeth Renshaw.  CURRENT THERAPY: Systemic chemotherapy with carboplatin for AUC of 5, Alimta 500 MG/M2 and Avastin 15 MG/KG every 3 weeks is status post 5 cycles. First cycle was given on 07/28/2016.   INTERVAL HISTORY: Edward Spence 61 y.o. male came to the clinic today for follow-up visit. The patient is feeling better today. Last week he has significant fatigue and weakness in his hemoglobin was down to 6.6. He'll receive 2 units of PRBCs transfusion and feeling much better. He also has neutropenia after his systemic chemotherapy and required treatment with Granix. He has no current chest pain, shortness of breath, cough or hemoptysis. He has no fever or chills. He has no nausea, vomiting, diarrhea or constipation. He denied having any weight loss or night sweats. He is here today for evaluation before starting cycle #6.  MEDICAL HISTORY: Past Medical History:  Diagnosis Date  . Constipation 07/23/2016  . Encounter for antineoplastic chemotherapy 06/24/2016  . Lung mass 05/19/2016  . Pneumonia    in college    ALLERGIES:  is allergic to penicillins.  MEDICATIONS:  Current Outpatient Prescriptions  Medication Sig Dispense Refill  . dexamethasone (DECADRON) 4 MG tablet 4 mg by mouth twice a day the day  before, day of and day after the chemotherapy every 3 weeks 40 tablet 1  . folic acid (FOLVITE) 1 MG tablet Take 1 tablet (1 mg total) by mouth daily. 30 tablet 4  . morphine (MS CONTIN) 30 MG 12 hr tablet Take 1 tablet (30 mg total) by mouth every 12 (twelve) hours. 60 tablet 0  . ondansetron (ZOFRAN ODT) 8 MG disintegrating tablet Take 1 tablet (8 mg total) by mouth every 8 (eight) hours as needed for nausea or vomiting. (Patient not taking: Reported on 10/08/2016) 30 tablet 1  . oxyCODONE (OXY IR/ROXICODONE) 5 MG immediate release tablet Take 1-2 tablets (5-10 mg total) by mouth every 4 (four) hours as needed for severe pain. 45 tablet 0  . oxyCODONE-acetaminophen (PERCOCET/ROXICET) 5-325 MG tablet Take 1-2 tablets by mouth every 6 (six) hours as needed for severe pain. (Patient not taking: Reported on 10/08/2016) 45 tablet 0  . prochlorperazine (COMPAZINE) 10 MG tablet Take 1 tablet (10 mg total) by mouth every 6 (six) hours as needed for nausea or vomiting. (Patient not taking: Reported on 10/08/2016) 30 tablet 0   No current facility-administered medications for this visit.     SURGICAL HISTORY:  Past Surgical History:  Procedure Laterality Date  . APPENDECTOMY    . VIDEO BRONCHOSCOPY WITH ENDOBRONCHIAL ULTRASOUND N/A 05/22/2016   Procedure: VIDEO BRONCHOSCOPY WITH ENDOBRONCHIAL ULTRASOUND;  Surgeon: [name], MD;  Location: [facility] [place] [facility];  Service: Thoracic;  Laterality: N/A;    REVIEW OF SYSTEMS:  Constitutional: positive for fatigue Eyes: negative Ears, nose, mouth, throat, and face: negative Respiratory: negative Cardiovascular: negative Gastrointestinal:  negative Genitourinary:negative Integument/breast: negative Hematologic/lymphatic: negative Musculoskeletal:negative Neurological: negative Behavioral/Psych: negative Endocrine: negative Allergic/Immunologic: negative   PHYSICAL EXAMINATION: General appearance: alert, cooperative, fatigued and no distress Head:  Normocephalic, without obvious abnormality, atraumatic Neck: no adenopathy, no JVD, supple, symmetrical, trachea midline and thyroid not enlarged, symmetric, no tenderness/mass/nodules Lymph nodes: Cervical, supraclavicular, and axillary nodes normal. Resp: clear to auscultation bilaterally Back: symmetric, no curvature. ROM normal. No CVA tenderness. Cardio: regular rate and rhythm, S1, S2 normal, no murmur, click, rub or gallop GI: soft, non-tender; bowel sounds normal; no masses,  no organomegaly Extremities: extremities normal, atraumatic, no cyanosis or edema Neurologic: Alert and oriented X 3, normal strength and tone. Normal symmetric reflexes. Normal coordination and gait  ECOG PERFORMANCE STATUS: 1 - Symptomatic but completely ambulatory  Blood pressure 130/61, pulse (!) 107, temperature 98 F (36.7 C), temperature source Oral, resp. rate 18, height 6' 2"  (1.88 m), weight 169 lb 9.6 oz (76.9 kg), SpO2 100 %.  LABORATORY DATA: Lab Results  Component Value Date   WBC 2.5 (L) 10/15/2016   HGB 10.0 (L) 10/15/2016   HCT 32.2 (L) 10/15/2016   MCV 99.1 (H) 10/15/2016   PLT 232 10/15/2016      Chemistry      Component Value Date/Time   NA 140 10/08/2016 0836   K 4.3 10/08/2016 0836   CL 107 05/22/2016 0641   CO2 24 10/08/2016 0836   BUN 13.3 10/08/2016 0836   CREATININE 0.9 10/08/2016 0836      Component Value Date/Time   CALCIUM 9.8 10/08/2016 0836   ALKPHOS 89 10/08/2016 0836   AST 17 10/08/2016 0836   ALT 10 10/08/2016 0836   BILITOT 0.52 10/08/2016 0836       RADIOGRAPHIC STUDIES: No results found.  ASSESSMENT AND PLAN:  This is a very pleasant 61 years old white male with: 1) stage IV non-small cell lung cancer, adenocarcinoma. The patient is currently on systemic chemotherapy with carboplatin, Alimta and Avastin status post 5 cycles. He is rating his treatment well except for chemotherapy-induced pancytopenia. He is feeling better today. We will proceed with  cycle #6 today as scheduled. I will see him back for follow-up visit in 4 weeks for evaluation after repeating CT scan of the chest, abdomen and pelvis for restaging of his disease. 2) chemotherapy-induced anemia: The patient received 2 units of PRBCs transfusion last week. His hemoglobin and hematocrit are better today. We will continue to monitor closely. We will consider him for transfusion again if his hemoglobin drop less than 8.0 G/DL. 3) chemotherapy-induced neutropenia: I will arrange for the patient to receive Onpro injection after his chemotherapy today. 4) chemotherapy-induced thrombocytopenia: Significantly improved. 5) pain management: He will continue on MS Contin and Percocet. The patient was advised to call immediately if he has any concerning symptoms in the interval. The patient voices understanding of current disease status and treatment options and is in agreement with the current care plan.  All questions were answered. The patient knows to call the clinic with any problems, questions or concerns. We can certainly see the patient much sooner if necessary.  Disclaimer: This note was dictated with voice recognition software. Similar sounding words can inadvertently be transcribed and may not be corrected upon review.

## 2016-10-15 NOTE — Patient Instructions (Signed)
[facility] Discharge Instructions for Patients Receiving Chemotherapy  Today you received the following chemotherapy agents:  Avastin, Alimta, Carboplatin  To help prevent nausea and vomiting after your treatment, we encourage you to take your nausea medication.   If you develop nausea and vomiting that is not controlled by your nausea medication, call the clinic.   BELOW ARE SYMPTOMS THAT SHOULD BE REPORTED IMMEDIATELY:  *FEVER GREATER THAN 100.5 F  *CHILLS WITH OR WITHOUT FEVER  NAUSEA AND VOMITING THAT IS NOT CONTROLLED WITH YOUR NAUSEA MEDICATION  *UNUSUAL SHORTNESS OF BREATH  *UNUSUAL BRUISING OR BLEEDING  TENDERNESS IN MOUTH AND THROAT WITH OR WITHOUT PRESENCE OF ULCERS  *URINARY PROBLEMS  *BOWEL PROBLEMS  UNUSUAL RASH Items with * indicate a potential emergency and should be followed up as soon as possible.  Feel free to call the clinic you have any questions or concerns. The clinic phone number is [phone].  Please show the CHEMO ALERT CARD at check-in to the Emergency Department and triage nurse.   

## 2016-10-22 ENCOUNTER — Other Ambulatory Visit (HOSPITAL_BASED_OUTPATIENT_CLINIC_OR_DEPARTMENT_OTHER): Payer: Medicaid Other

## 2016-10-22 DIAGNOSIS — C3411 Malignant neoplasm of upper lobe, right bronchus or lung: Secondary | ICD-10-CM

## 2016-10-22 DIAGNOSIS — C7951 Secondary malignant neoplasm of bone: Secondary | ICD-10-CM | POA: Diagnosis not present

## 2016-10-22 LAB — COMPREHENSIVE METABOLIC PANEL
ALBUMIN: 3.6 g/dL (ref 3.5–5.0)
ALK PHOS: 180 U/L — AB (ref 40–150)
ALT: 48 U/L (ref 0–55)
ANION GAP: 10 meq/L (ref 3–11)
AST: 56 U/L — ABNORMAL HIGH (ref 5–34)
BILIRUBIN TOTAL: 0.82 mg/dL (ref 0.20–1.20)
BUN: 17.4 mg/dL (ref 7.0–26.0)
CO2: 26 meq/L (ref 22–29)
Calcium: 10.1 mg/dL (ref 8.4–10.4)
Chloride: 104 mEq/L (ref 98–109)
Creatinine: 0.8 mg/dL (ref 0.7–1.3)
EGFR: >90 mL/min/{1.73_m2} (ref >90)
GLUCOSE: 96 mg/dL (ref 70–140)
POTASSIUM: 4.6 meq/L (ref 3.5–5.1)
SODIUM: 140 meq/L (ref 136–145)
TOTAL PROTEIN: 7.5 g/dL (ref 6.4–8.3)

## 2016-10-22 LAB — CBC WITH DIFFERENTIAL/PLATELET
BASO%: 0.4 % (ref 0.0–2.0)
Basophils Absolute: 0 10*3/uL (ref 0.0–0.1)
EOS ABS: 0.1 10*3/uL (ref 0.0–0.5)
EOS%: 2.2 % (ref 0.0–7.0)
HCT: 31.3 % — ABNORMAL LOW (ref 38.4–49.9)
HGB: 9.7 g/dL — ABNORMAL LOW (ref 13.0–17.1)
LYMPH%: 10.5 % — AB (ref 14.0–49.0)
MCH: 31.3 pg (ref 27.2–33.4)
MCHC: 31 g/dL — ABNORMAL LOW (ref 32.0–36.0)
MCV: 101 fL — AB (ref 79.3–98.0)
MONO#: 0.1 10*3/uL (ref 0.1–0.9)
MONO%: 5.1 % (ref 0.0–14.0)
NEUT%: 81.8 % — ABNORMAL HIGH (ref 39.0–75.0)
NEUTROS ABS: 2.3 10*3/uL (ref 1.5–6.5)
PLATELETS: 84 10*3/uL — AB (ref 140–400)
RBC: 3.1 10*6/uL — AB (ref 4.20–5.82)
RDW: 20.6 % — ABNORMAL HIGH (ref 11.0–14.6)
WBC: 2.8 10*3/uL — ABNORMAL LOW (ref 4.0–10.3)
lymph#: 0.3 10*3/uL — ABNORMAL LOW (ref 0.9–3.3)

## 2016-10-27 ENCOUNTER — Telehealth: Payer: Self-pay

## 2016-10-27 NOTE — Telephone Encounter (Signed)
Pt has CT CAP on 1/31. Pt called asking if we can give him a call for the results rather than waiting on pins and needles for a week until his f/u appt on 2/7.

## 2016-10-28 ENCOUNTER — Telehealth: Payer: Self-pay | Admitting: Medical Oncology

## 2016-10-28 NOTE — Telephone Encounter (Signed)
Wants just a call about scan report -(no details)  , the next day. I told pt to call Thursday .

## 2016-10-29 ENCOUNTER — Ambulatory Visit (HOSPITAL_COMMUNITY)
Admission: RE | Admit: 2016-10-29 | Discharge: 2016-10-29 | Disposition: A | Discharge status: Home / Self Care | Payer: Medicaid Other | Source: Ambulatory Visit | Attending: Internal Medicine | Admitting: Internal Medicine

## 2016-10-29 ENCOUNTER — Other Ambulatory Visit (HOSPITAL_BASED_OUTPATIENT_CLINIC_OR_DEPARTMENT_OTHER): Payer: Medicaid Other

## 2016-10-29 ENCOUNTER — Encounter (HOSPITAL_COMMUNITY): Payer: Self-pay

## 2016-10-29 DIAGNOSIS — C3411 Malignant neoplasm of upper lobe, right bronchus or lung: Secondary | ICD-10-CM

## 2016-10-29 DIAGNOSIS — R911 Solitary pulmonary nodule: Secondary | ICD-10-CM | POA: Insufficient documentation

## 2016-10-29 DIAGNOSIS — I313 Pericardial effusion (noninflammatory): Secondary | ICD-10-CM | POA: Diagnosis not present

## 2016-10-29 DIAGNOSIS — I700 Atherosclerosis of aorta: Secondary | ICD-10-CM | POA: Insufficient documentation

## 2016-10-29 LAB — COMPREHENSIVE METABOLIC PANEL
ALT: 24 U/L (ref 0–55)
AST: 24 U/L (ref 5–34)
Albumin: 3.5 g/dL (ref 3.5–5.0)
Alkaline Phosphatase: 105 U/L (ref 40–150)
Anion Gap: 10 mEq/L (ref 3–11)
BUN: 8.4 mg/dL (ref 7.0–26.0)
CHLORIDE: 103 meq/L (ref 98–109)
CO2: 26 mEq/L (ref 22–29)
Calcium: 9.8 mg/dL (ref 8.4–10.4)
Creatinine: 0.8 mg/dL (ref 0.7–1.3)
EGFR: >90 mL/min/{1.73_m2} (ref >90)
Glucose: 80 mg/dl (ref 70–140)
POTASSIUM: 4.9 meq/L (ref 3.5–5.1)
SODIUM: 139 meq/L (ref 136–145)
Total Bilirubin: 0.29 mg/dL (ref 0.20–1.20)
Total Protein: 7.2 g/dL (ref 6.4–8.3)

## 2016-10-29 LAB — CBC WITH DIFFERENTIAL/PLATELET
BASO%: 0.3 % (ref 0.0–2.0)
Basophils Absolute: 0 10*3/uL (ref 0.0–0.1)
EOS%: 0.3 % (ref 0.0–7.0)
Eosinophils Absolute: 0 10*3/uL (ref 0.0–0.5)
HCT: 27.7 % — ABNORMAL LOW (ref 38.4–49.9)
HGB: 8.6 g/dL — ABNORMAL LOW (ref 13.0–17.1)
LYMPH#: 0.4 10*3/uL — AB (ref 0.9–3.3)
LYMPH%: 11 % — AB (ref 14.0–49.0)
MCH: 31.3 pg (ref 27.2–33.4)
MCHC: 31 g/dL — ABNORMAL LOW (ref 32.0–36.0)
MCV: 100.7 fL — ABNORMAL HIGH (ref 79.3–98.0)
MONO#: 0.3 10*3/uL (ref 0.1–0.9)
MONO%: 10.1 % (ref 0.0–14.0)
NEUT#: 2.6 10*3/uL (ref 1.5–6.5)
NEUT%: 78.3 % — AB (ref 39.0–75.0)
Platelets: 17 10*3/uL — ABNORMAL LOW (ref 140–400)
RBC: 2.75 10*6/uL — ABNORMAL LOW (ref 4.20–5.82)
RDW: 19.5 % — ABNORMAL HIGH (ref 11.0–14.6)
WBC: 3.4 10*3/uL — ABNORMAL LOW (ref 4.0–10.3)
nRBC: 0 % (ref 0–0)

## 2016-10-29 MED ORDER — IOPAMIDOL (ISOVUE-300) INJECTION 61%
INTRAVENOUS | Status: AC
  Filled 2016-10-29: qty 100

## 2016-10-29 MED ORDER — IOPAMIDOL (ISOVUE-300) INJECTION 61%
100.0000 mL | Freq: Once | INTRAVENOUS | Status: AC | PRN
  Administered 2016-10-29: 100 mL via INTRAVENOUS

## 2016-10-29 MED ORDER — SODIUM CHLORIDE 0.9 % IJ SOLN
INTRAMUSCULAR | Status: AC
  Filled 2016-10-29: qty 50

## 2016-10-30 ENCOUNTER — Telehealth: Payer: Self-pay

## 2016-10-30 NOTE — Telephone Encounter (Signed)
Wife called for results of CT done yesterday. Impression read to pt. No interpretation given, stating that is up to Dr Julien Nordmann.

## 2016-11-04 ENCOUNTER — Other Ambulatory Visit: Payer: Self-pay | Admitting: Medical Oncology

## 2016-11-04 DIAGNOSIS — C3411 Malignant neoplasm of upper lobe, right bronchus or lung: Secondary | ICD-10-CM

## 2016-11-05 ENCOUNTER — Encounter: Payer: Self-pay | Admitting: Internal Medicine

## 2016-11-05 ENCOUNTER — Telehealth: Payer: Self-pay | Admitting: *Deleted

## 2016-11-05 ENCOUNTER — Telehealth: Payer: Self-pay | Admitting: Internal Medicine

## 2016-11-05 ENCOUNTER — Encounter: Payer: Medicaid Other | Admitting: Nutrition

## 2016-11-05 ENCOUNTER — Ambulatory Visit (HOSPITAL_BASED_OUTPATIENT_CLINIC_OR_DEPARTMENT_OTHER): Payer: Medicaid Other | Admitting: Internal Medicine

## 2016-11-05 ENCOUNTER — Ambulatory Visit: Payer: Medicaid Other

## 2016-11-05 ENCOUNTER — Other Ambulatory Visit (HOSPITAL_BASED_OUTPATIENT_CLINIC_OR_DEPARTMENT_OTHER): Payer: Medicaid Other

## 2016-11-05 VITALS — BP 115/76 | HR 113 | Temp 97.9°F | Resp 17 | Ht 74.0 in | Wt 167.7 lb

## 2016-11-05 DIAGNOSIS — C3411 Malignant neoplasm of upper lobe, right bronchus or lung: Secondary | ICD-10-CM

## 2016-11-05 DIAGNOSIS — G893 Neoplasm related pain (acute) (chronic): Secondary | ICD-10-CM

## 2016-11-05 DIAGNOSIS — D702 Other drug-induced agranulocytosis: Secondary | ICD-10-CM

## 2016-11-05 DIAGNOSIS — C7951 Secondary malignant neoplasm of bone: Secondary | ICD-10-CM

## 2016-11-05 DIAGNOSIS — D6481 Anemia due to antineoplastic chemotherapy: Secondary | ICD-10-CM | POA: Diagnosis not present

## 2016-11-05 DIAGNOSIS — D61810 Antineoplastic chemotherapy induced pancytopenia: Secondary | ICD-10-CM | POA: Diagnosis not present

## 2016-11-05 DIAGNOSIS — Z5111 Encounter for antineoplastic chemotherapy: Secondary | ICD-10-CM

## 2016-11-05 LAB — CBC WITH DIFFERENTIAL/PLATELET
BASO%: 0.6 % (ref 0.0–2.0)
Basophils Absolute: 0 10*3/uL (ref 0.0–0.1)
EOS%: 0.5 % (ref 0.0–7.0)
Eosinophils Absolute: 0 10*3/uL (ref 0.0–0.5)
HEMATOCRIT: 29.1 % — AB (ref 38.4–49.9)
HEMOGLOBIN: 9.3 g/dL — AB (ref 13.0–17.1)
LYMPH#: 0.5 10*3/uL — AB (ref 0.9–3.3)
LYMPH%: 8.3 % — ABNORMAL LOW (ref 14.0–49.0)
MCH: 32.2 pg (ref 27.2–33.4)
MCHC: 31.9 g/dL — AB (ref 32.0–36.0)
MCV: 100.9 fL — ABNORMAL HIGH (ref 79.3–98.0)
MONO#: 0.5 10*3/uL (ref 0.1–0.9)
MONO%: 8.8 % (ref 0.0–14.0)
NEUT%: 81.8 % — AB (ref 39.0–75.0)
NEUTROS ABS: 4.6 10*3/uL (ref 1.5–6.5)
Platelets: 92 10*3/uL — ABNORMAL LOW (ref 140–400)
RBC: 2.88 10*6/uL — ABNORMAL LOW (ref 4.20–5.82)
RDW: 23.5 % — AB (ref 11.0–14.6)
WBC: 5.6 10*3/uL (ref 4.0–10.3)

## 2016-11-05 LAB — COMPREHENSIVE METABOLIC PANEL
ALT: 20 U/L (ref 0–55)
AST: 22 U/L (ref 5–34)
Albumin: 3.6 g/dL (ref 3.5–5.0)
Alkaline Phosphatase: 98 U/L (ref 40–150)
Anion Gap: 11 mEq/L (ref 3–11)
BUN: 10 mg/dL (ref 7.0–26.0)
CALCIUM: 10 mg/dL (ref 8.4–10.4)
CHLORIDE: 105 meq/L (ref 98–109)
CO2: 25 mEq/L (ref 22–29)
CREATININE: 0.8 mg/dL (ref 0.7–1.3)
EGFR: >90 mL/min/{1.73_m2} (ref >90)
Glucose: 93 mg/dl (ref 70–140)
Potassium: 4 mEq/L (ref 3.5–5.1)
Sodium: 141 mEq/L (ref 136–145)
Total Bilirubin: 0.3 mg/dL (ref 0.20–1.20)
Total Protein: 7.8 g/dL (ref 6.4–8.3)

## 2016-11-05 NOTE — Telephone Encounter (Signed)
Per 2/7 LOS and staff message I have scheduled appts and notified the scheduler

## 2016-11-05 NOTE — Progress Notes (Signed)
DISCONTINUE ON PATHWAY REGIMEN - Non-Small Cell Lung  [id number]: Carboplatin AUC=5 + Pemetrexed 500 mg/m2 + Bevacizumab 15 mg/kg q21 Days x 4 Cycles   A cycle is every 21 days:     Carboplatin (Paraplatin(R)) AUC=5 in 250 mL NS IV over 1 hour Dose Mod: None     Pemetrexed (Alimta(R)) 500 mg/m2 in 100 mL NS IV over 10 minutes, manufacturer recommends not administering to patients with CrCl < 45 mL/min Dose Mod: None     Bevacizumab (Avastin(R)) 15 mg/kg in 100 mL NS IV over 90 minutes first infusion, 60 minutes second infusion and 30 minutes all subsequent infusions if tolerated Dose Mod: None Additional Orders: * All AUC calculations intended to be used in [employer] formula Note: Patient to receive the following prior to the initiation of therapy: 1) Dexamethasone 4 mg orally twice daily x 6 doses.  First dose 24 hours before chemotherapy. 2) Folic acid >= 301 mcg orally daily.  First dose at least 5 days prior to the first dose of pemetrexed. 3) Vitamin B12 1,000 mcg intramuscularly every 9 weeks.  First dose at least 5 days prior to the first dose of pemetrexed.  **Always confirm dose/schedule in your pharmacy ordering system**    REASON: Other Reason PRIOR TREATMENT: [id number]: Carboplatin AUC=5 + Pemetrexed 500 mg/m2 + Bevacizumab 15 mg/kg q21 Days x 4 Cycles TREATMENT RESPONSE: Partial Response (PR)  START ON PATHWAY REGIMEN - Non-Small Cell Lung  [id number]: Pemetrexed 500 mg/m2 q21 Days Until Progression or Unacceptable Toxicity   A cycle is every 21 days:     Pemetrexed (Alimta(R)) 500 mg/m2 in 100 mL NS IV over 10 minutes, manufacturer recommends not administering to patients with CrCl < 45 mL/min Dose Mod: None Additional Orders: Note: Patient to receive the following prior to the initiation of therapy: 1) Dexamethasone 4 mg orally twice daily x 6 doses.  First dose 24 hours before chemotherapy. 2) Folic acid >= 220 mcg orally daily.  First dose at least 5 days prior to the first dose  of pemetrexed. 3) Vitamin B12 1,000 mcg intramuscularly every 9 weeks.  First dose at least 5 days prior to the first dose of pemetrexed.  **Always confirm dose/schedule in your pharmacy ordering system**    Patient Characteristics: Stage IV Metastatic, Non Squamous, Maintenance - Chemotherapy/Immunotherapy, PS = 0, 1, Initial Pemetrexed and Platinum Agent AJCC T Category: T3 Current Disease Status: Distant Metastases AJCC N Category: N3 AJCC M Category: M1c AJCC 8 Stage Grouping: IVB Histology: Non Squamous Cell ROS1 Rearrangement Status: Negative T790M Mutation Status: Not Applicable - EGFR Mutation Negative/Unknown Other Mutations/Biomarkers: No Other Actionable Mutations PD-L1 Expression Status: Quantity Not Sufficient Chemotherapy/Immunotherapy LOT: Maintenance Chemotherapy/Immunotherapy Molecular Targeted Therapy: Not Appropriate ALK Translocation Status: Negative Would you be surprised if this patient died  in the next year? I would NOT be surprised if this patient died in the next year EGFR Mutation Status: Negative/Wild Type BRAF [id number] Mutation Status: Negative Performance Status: PS = 0, 1  Intent of Therapy: Non-Curative / Palliative Intent, Discussed with Patient

## 2016-11-05 NOTE — Progress Notes (Signed)
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  GUARDANT 360: Negative for EGFR, ALK, ROS 1, BRAF mutations but showed amplification of EGFR and MET  PRIOR THERAPY: 1) Palliative systemic chemotherapy to the right superior mediastinal and right paratracheal lymphadenopathy under the care of Dr. Lisbeth Renshaw. 2) Systemic chemotherapy with carboplatin for AUC of 5, Alimta 500 MG/M2 and Avastin 15 MG/KG every 3 weeks is status post 6 cycles. First cycle was given on 07/28/2016.   CURRENT THERAPY: Maintenance systemic chemotherapy with Alimta 500 MG/M2 and Avastin 15 MG/KG every 3 weeks. First dose 11/19/2016.  INTERVAL HISTORY: Edward Spence 61 y.o. male returns to the clinic today for follow-up visit accompanied by his girlfriend. The patient is feeling fine today was no specific complaints except for fatigue and occasional dizzy spells. He also has some shortness breath with exertion. He received units of PRBCs transfusion last week. His platelets has also improved from last week. He denied having any bleeding issues. He tolerated the last cycle of his systemic chemotherapy was carboplatin, Alimta and Avastin well except for the pancytopenia and fatigue. He denied having any current chest pain, cough or hemoptysis. He has no nausea, vomiting, diarrhea or constipation. He has no fever or chills. He denied having any headache or visual changes. The patient had repeat CT scan of the chest, abdomen and pelvis performed recently and he is here for evaluation and discussion of his scan results.  MEDICAL  HISTORY: Past Medical History:  Diagnosis Date  . Constipation 07/23/2016  . Encounter for antineoplastic chemotherapy 06/24/2016  . Lung mass 05/19/2016  . Pneumonia    in college    ALLERGIES:  is allergic to penicillins.  MEDICATIONS:  Current Outpatient Prescriptions  Medication Sig Dispense Refill  . dexamethasone (DECADRON) 4 MG tablet 4 mg by mouth twice a day the day before, day of and day after the chemotherapy every 3 weeks 40 tablet 1  . folic acid (FOLVITE) 1 MG tablet Take 1 tablet (1 mg total) by mouth daily. 30 tablet 4  . morphine (MS CONTIN) 30 MG 12 hr tablet Take 1 tablet (30 mg total) by mouth every 12 (twelve) hours. 60 tablet 0  . ondansetron (ZOFRAN ODT) 8 MG disintegrating tablet Take 1 tablet (8 mg total) by mouth every 8 (eight) hours as needed for nausea or vomiting. (Patient not taking: Reported on 10/15/2016) 30 tablet 1  . oxyCODONE (OXY IR/ROXICODONE) 5 MG immediate release tablet Take 1-2 tablets (5-10 mg total) by mouth every 4 (four) hours as needed for severe pain. (Patient not taking: Reported on 10/15/2016) 45 tablet 0  . oxyCODONE-acetaminophen (PERCOCET/ROXICET) 5-325 MG tablet Take 1-2 tablets by mouth every 6 (six) hours as needed for severe pain. (Patient not taking: Reported on 10/15/2016) 45 tablet 0  . prochlorperazine (COMPAZINE) 10 MG tablet Take 1 tablet (10 mg total) by mouth every 6 (six) hours as needed for nausea or vomiting. (Patient not taking: Reported on 10/15/2016) 30 tablet 0   No current facility-administered medications for this visit.     SURGICAL HISTORY:  Past Surgical History:  Procedure  Laterality Date  . APPENDECTOMY    . VIDEO BRONCHOSCOPY WITH ENDOBRONCHIAL ULTRASOUND N/A 05/22/2016   Procedure: VIDEO BRONCHOSCOPY WITH ENDOBRONCHIAL ULTRASOUND;  Surgeon: [name], MD;  Location: [facility];  Service: Thoracic;  Laterality: N/A;    REVIEW OF SYSTEMS:  Constitutional: positive for fatigue Eyes: negative Ears, nose,  mouth, throat, and face: negative Respiratory: positive for dyspnea on exertion Cardiovascular: negative Gastrointestinal: negative Genitourinary:negative Integument/breast: negative Hematologic/lymphatic: negative Musculoskeletal:negative Neurological: negative Behavioral/Psych: negative Endocrine: negative Allergic/Immunologic: negative   PHYSICAL EXAMINATION: General appearance: alert, cooperative, fatigued and no distress Head: Normocephalic, without obvious abnormality, atraumatic Neck: no adenopathy, no JVD, supple, symmetrical, trachea midline and thyroid not enlarged, symmetric, no tenderness/mass/nodules Lymph nodes: Cervical, supraclavicular, and axillary nodes normal. Resp: clear to auscultation bilaterally Back: symmetric, no curvature. ROM normal. No CVA tenderness. Cardio: regular rate and rhythm, S1, S2 normal, no murmur, click, rub or gallop GI: soft, non-tender; bowel sounds normal; no masses,  no organomegaly Extremities: extremities normal, atraumatic, no cyanosis or edema Neurologic: Alert and oriented X 3, normal strength and tone. Normal symmetric reflexes. Normal coordination and gait  ECOG PERFORMANCE STATUS: 1 - Symptomatic but completely ambulatory  Blood pressure 115/76, pulse (!) 113, temperature 97.9 F (36.6 C), temperature source Oral, resp. rate 17, height 6' 2"  (1.88 m), weight 167 lb 11.2 oz (76.1 kg), SpO2 100 %.  LABORATORY DATA: Lab Results  Component Value Date   WBC 5.6 11/05/2016   HGB 9.3 (L) 11/05/2016   HCT 29.1 (L) 11/05/2016   MCV 100.9 (H) 11/05/2016   PLT 92 (L) 11/05/2016      Chemistry      Component Value Date/Time   NA 141 11/05/2016 0933   K 4.0 11/05/2016 0933   CL 107 05/22/2016 0641   CO2 25 11/05/2016 0933   BUN 10.0 11/05/2016 0933   CREATININE 0.8 11/05/2016 0933      Component Value Date/Time   CALCIUM 10.0 11/05/2016 0933   ALKPHOS 98 11/05/2016 0933   AST 22 11/05/2016 0933   ALT 20 11/05/2016 0933    BILITOT 0.30 11/05/2016 0933       RADIOGRAPHIC STUDIES: Ct Chest W Contrast  Result Date: 10/29/2016 CLINICAL DATA:  Right lung cancer diagnosed 8/17 with chemotherapy and radiation therapy completed. 60 pound weight loss. Cough. Right upper lobe primary. Restaging. EXAM: CT CHEST, ABDOMEN, AND PELVIS WITH CONTRAST TECHNIQUE: Multidetector CT imaging of the chest, abdomen and pelvis was performed following the standard protocol during bolus administration of intravenous contrast. CONTRAST:  123m ISOVUE-300 IOPAMIDOL (ISOVUE-300) INJECTION 61% COMPARISON:  09/01/2016 FINDINGS: CT CHEST FINDINGS Cardiovascular: Aortic atherosclerosis. Normal heart size with mild anterior pericardial effusion, increased. No central pulmonary embolism, on this non-dedicated study. Mediastinum/Nodes: No supraclavicular adenopathy. Right paratracheal adenopathy at 2.2 x 1.9 cm on image 20/series 2. Compare 2.7 x 2.7 cm on the prior exam. Right suprahilar node measures 1.5 cm on image 29/series 2 versus 1.6 cm on the prior. Lungs/Pleura: No pleural fluid. Moderate centrilobular emphysema. Lower lobe predominant bronchial wall thickening. Right upper lobe pulmonary nodule measures 9 x 9 mm on image 37/series 2. Similar to 8 x 10 mm on the prior. Probable mucoid impaction in right lower lobe bronchi. Musculoskeletal: Remote lateral left fifth rib trauma. CT ABDOMEN PELVIS FINDINGS Hepatobiliary: Normal liver. Normal gallbladder, without biliary ductal dilatation. Pancreas: Pancreatic atrophy, without duct dilatation or dominant mass. Spleen: Normal in size, without focal abnormality. Adrenals/Urinary Tract: Normal adrenal glands. Normal kidneys, without hydronephrosis. Normal urinary bladder. Stomach/Bowel:  Normal stomach, without wall thickening. Colonic stool burden suggests constipation. Normal terminal ileum and appendix. Normal small bowel. Vascular/Lymphatic: Advanced aortic and branch vessel atherosclerosis. No  abdominopelvic adenopathy. Reproductive: Normal prostate. Other: No significant free fluid. No evidence of omental or peritoneal disease. Musculoskeletal: Vague right iliac sclerosis is similar, including at 6.9 cm on image 109/series 2. Right-sided L5 lesion measures 1.6 cm on image 96/series 2 versus 1.8 cm on the prior. IMPRESSION: 1. Response to therapy of thoracic adenopathy. 2. Similar right upper lobe pulmonary nodule. 3. No soft tissue metastasis in the abdomen or pelvis. 4. Similar to mild improvement of osseous metastasis. 5.  Aortic atherosclerosis. 6. Small pericardial effusion, increased. 7.  Possible constipation. Electronically Signed   By: Abigail Miyamoto M.D.   On: 10/29/2016 10:33   Ct Abdomen Pelvis W Contrast  Result Date: 10/29/2016 CLINICAL DATA:  Right lung cancer diagnosed 8/17 with chemotherapy and radiation therapy completed. 60 pound weight loss. Cough. Right upper lobe primary. Restaging. EXAM: CT CHEST, ABDOMEN, AND PELVIS WITH CONTRAST TECHNIQUE: Multidetector CT imaging of the chest, abdomen and pelvis was performed following the standard protocol during bolus administration of intravenous contrast. CONTRAST:  142m ISOVUE-300 IOPAMIDOL (ISOVUE-300) INJECTION 61% COMPARISON:  09/01/2016 FINDINGS: CT CHEST FINDINGS Cardiovascular: Aortic atherosclerosis. Normal heart size with mild anterior pericardial effusion, increased. No central pulmonary embolism, on this non-dedicated study. Mediastinum/Nodes: No supraclavicular adenopathy. Right paratracheal adenopathy at 2.2 x 1.9 cm on image 20/series 2. Compare 2.7 x 2.7 cm on the prior exam. Right suprahilar node measures 1.5 cm on image 29/series 2 versus 1.6 cm on the prior. Lungs/Pleura: No pleural fluid. Moderate centrilobular emphysema. Lower lobe predominant bronchial wall thickening. Right upper lobe pulmonary nodule measures 9 x 9 mm on image 37/series 2. Similar to 8 x 10 mm on the prior. Probable mucoid impaction in right lower  lobe bronchi. Musculoskeletal: Remote lateral left fifth rib trauma. CT ABDOMEN PELVIS FINDINGS Hepatobiliary: Normal liver. Normal gallbladder, without biliary ductal dilatation. Pancreas: Pancreatic atrophy, without duct dilatation or dominant mass. Spleen: Normal in size, without focal abnormality. Adrenals/Urinary Tract: Normal adrenal glands. Normal kidneys, without hydronephrosis. Normal urinary bladder. Stomach/Bowel: Normal stomach, without wall thickening. Colonic stool burden suggests constipation. Normal terminal ileum and appendix. Normal small bowel. Vascular/Lymphatic: Advanced aortic and branch vessel atherosclerosis. No abdominopelvic adenopathy. Reproductive: Normal prostate. Other: No significant free fluid. No evidence of omental or peritoneal disease. Musculoskeletal: Vague right iliac sclerosis is similar, including at 6.9 cm on image 109/series 2. Right-sided L5 lesion measures 1.6 cm on image 96/series 2 versus 1.8 cm on the prior. IMPRESSION: 1. Response to therapy of thoracic adenopathy. 2. Similar right upper lobe pulmonary nodule. 3. No soft tissue metastasis in the abdomen or pelvis. 4. Similar to mild improvement of osseous metastasis. 5.  Aortic atherosclerosis. 6. Small pericardial effusion, increased. 7.  Possible constipation. Electronically Signed   By: KAbigail MiyamotoM.D.   On: 10/29/2016 10:33    ASSESSMENT AND PLAN: This is a very pleasant 61years old white male with metastatic non-small cell lung cancer, adenocarcinoma. He was Status post palliative radiotherapy to the superior mediastinal and right paratracheal lymphadenopathy. This was followed by induction systemic chemotherapy with carboplatin, Alimta and Avastin status post 6 cycles. He tolerated his treatment well except for fatigue from chemotherapy-induced anemia. The patient had repeat CT scan of the chest, abdomen and pelvis performed recently. I personally and independently reviewed the scan images and discuss  the results with the patient and his  girlfriend. The scan showed further improvement in his disease. I discussed with the patient consideration of maintenance systemic chemotherapy with Alimta 500 MG/M2 and Avastin 15 MG/KG every 3 weeks. I discussed with the patient adverse effect of this treatment including but not limited to alopecia, myelosuppression, nausea and vomiting, peripheral neuropathy, liver or renal dysfunction as well as the adverse effect of Avastin including hypertension, proteinuria, increased risk of pulmonary hemorrhage and GI perforation. He would like to proceed with treatment as planned and he is expected to start the first dose of this treatment on 11/19/2016. For the chemotherapy-induced anemia, the patient received 2 units of PRBCs transfusion recently. For the chemotherapy-induced thrombocytopenia, his platelets count has significantly improved in the interval. We will continue to monitor for now. For pain management, he will continue on his current treatment with MS Contin and oxycodone for breakthrough pain. For the bone metastasis, I would consider the patient for treatment with Xgeva after I receive dental clearance. The patient would come back for follow-up visit in 5 weeks for evaluation before starting cycle #2. He was advised to call immediately if he has any concerning symptoms in the interval. The patient voices understanding of current disease status and treatment options and is in agreement with the current care plan.  All questions were answered. The patient knows to call the clinic with any problems, questions or concerns. We can certainly see the patient much sooner if necessary.  Disclaimer: This note was dictated with voice recognition software. Similar sounding words can inadvertently be transcribed and may not be corrected upon review.

## 2016-11-05 NOTE — Telephone Encounter (Signed)
All previously scheduled treatment was cancelled per 11/07/15 los. Nutritionist appointment rescheduled for 12/10/16. Message sent to chemo scheduler to be added. Appointments scheduled per 11/05/16 los. Patient was given a copy of the AVS report and appointment schedule per 11/05/16 los.

## 2016-11-12 ENCOUNTER — Other Ambulatory Visit: Payer: Medicaid Other

## 2016-11-19 ENCOUNTER — Other Ambulatory Visit: Payer: Medicaid Other

## 2016-11-19 ENCOUNTER — Ambulatory Visit (HOSPITAL_BASED_OUTPATIENT_CLINIC_OR_DEPARTMENT_OTHER): Payer: Medicaid Other

## 2016-11-19 ENCOUNTER — Other Ambulatory Visit (HOSPITAL_BASED_OUTPATIENT_CLINIC_OR_DEPARTMENT_OTHER): Payer: Medicaid Other

## 2016-11-19 VITALS — BP 125/72 | HR 99 | Temp 97.9°F | Resp 20 | Ht 74.0 in | Wt 168.0 lb

## 2016-11-19 DIAGNOSIS — Z5112 Encounter for antineoplastic immunotherapy: Secondary | ICD-10-CM

## 2016-11-19 DIAGNOSIS — Z5111 Encounter for antineoplastic chemotherapy: Secondary | ICD-10-CM

## 2016-11-19 DIAGNOSIS — C3411 Malignant neoplasm of upper lobe, right bronchus or lung: Secondary | ICD-10-CM

## 2016-11-19 LAB — UA PROTEIN, DIPSTICK - CHCC: PROTEIN: NEGATIVE mg/dL

## 2016-11-19 LAB — CBC WITH DIFFERENTIAL/PLATELET
BASO%: 0.2 % (ref 0.0–2.0)
BASOS ABS: 0 10*3/uL (ref 0.0–0.1)
EOS%: 0.2 % (ref 0.0–7.0)
Eosinophils Absolute: 0 10*3/uL (ref 0.0–0.5)
HEMATOCRIT: 34 % — AB (ref 38.4–49.9)
HGB: 10.2 g/dL — ABNORMAL LOW (ref 13.0–17.1)
LYMPH#: 0.6 10*3/uL — AB (ref 0.9–3.3)
LYMPH%: 11.9 % — ABNORMAL LOW (ref 14.0–49.0)
MCH: 31.8 pg (ref 27.2–33.4)
MCHC: 30 g/dL — AB (ref 32.0–36.0)
MCV: 105.9 fL — ABNORMAL HIGH (ref 79.3–98.0)
MONO#: 0.6 10*3/uL (ref 0.1–0.9)
MONO%: 11.7 % (ref 0.0–14.0)
NEUT#: 3.6 10*3/uL (ref 1.5–6.5)
NEUT%: 76 % — AB (ref 39.0–75.0)
PLATELETS: 237 10*3/uL (ref 140–400)
RBC: 3.21 10*6/uL — ABNORMAL LOW (ref 4.20–5.82)
RDW: 22 % — ABNORMAL HIGH (ref 11.0–14.6)
WBC: 4.8 10*3/uL (ref 4.0–10.3)

## 2016-11-19 LAB — COMPREHENSIVE METABOLIC PANEL
ALT: 14 U/L (ref 0–55)
ANION GAP: 10 meq/L (ref 3–11)
AST: 21 U/L (ref 5–34)
Albumin: 3.4 g/dL — ABNORMAL LOW (ref 3.5–5.0)
Alkaline Phosphatase: 82 U/L (ref 40–150)
BUN: 11.9 mg/dL (ref 7.0–26.0)
CALCIUM: 10.2 mg/dL (ref 8.4–10.4)
CHLORIDE: 105 meq/L (ref 98–109)
CO2: 26 mEq/L (ref 22–29)
Creatinine: 0.8 mg/dL (ref 0.7–1.3)
Glucose: 90 mg/dl (ref 70–140)
POTASSIUM: 4.2 meq/L (ref 3.5–5.1)
Sodium: 141 mEq/L (ref 136–145)
Total Bilirubin: 0.32 mg/dL (ref 0.20–1.20)
Total Protein: 7.7 g/dL (ref 6.4–8.3)

## 2016-11-19 MED ORDER — SODIUM CHLORIDE 0.9 % IV SOLN
14.0000 mg/kg | Freq: Once | INTRAVENOUS | Status: AC
  Administered 2016-11-19: 1200 mg via INTRAVENOUS
  Filled 2016-11-19: qty 48

## 2016-11-19 MED ORDER — SODIUM CHLORIDE 0.9 % IV SOLN
Freq: Once | INTRAVENOUS | Status: AC
  Administered 2016-11-19: 10:00:00 via INTRAVENOUS

## 2016-11-19 MED ORDER — SODIUM CHLORIDE 0.9 % IV SOLN
500.0000 mg/m2 | Freq: Once | INTRAVENOUS | Status: AC
  Administered 2016-11-19: 1000 mg via INTRAVENOUS
  Filled 2016-11-19: qty 40

## 2016-11-19 MED ORDER — PROCHLORPERAZINE MALEATE 10 MG PO TABS
ORAL_TABLET | ORAL | Status: AC
  Filled 2016-11-19: qty 1

## 2016-11-19 MED ORDER — PROCHLORPERAZINE MALEATE 10 MG PO TABS
10.0000 mg | ORAL_TABLET | Freq: Once | ORAL | Status: AC
  Administered 2016-11-19: 10 mg via ORAL

## 2016-11-19 NOTE — Patient Instructions (Signed)
[facility] Discharge Instructions for Patients Receiving Chemotherapy  Today you received the following chemotherapy agents Avastin and Alimta   To help prevent nausea and vomiting after your treatment, we encourage you to take your nausea medication as directed.    If you develop nausea and vomiting that is not controlled by your nausea medication, call the clinic.   BELOW ARE SYMPTOMS THAT SHOULD BE REPORTED IMMEDIATELY:  *FEVER GREATER THAN 100.5 F  *CHILLS WITH OR WITHOUT FEVER  NAUSEA AND VOMITING THAT IS NOT CONTROLLED WITH YOUR NAUSEA MEDICATION  *UNUSUAL SHORTNESS OF BREATH  *UNUSUAL BRUISING OR BLEEDING  TENDERNESS IN MOUTH AND THROAT WITH OR WITHOUT PRESENCE OF ULCERS  *URINARY PROBLEMS  *BOWEL PROBLEMS  UNUSUAL RASH Items with * indicate a potential emergency and should be followed up as soon as possible.  Feel free to call the clinic you have any questions or concerns. The clinic phone number is [phone].  Please show the [facility] at check-in to the Emergency Department and triage nurse.  Pemetrexed injection What is this medicine? PEMETREXED (PEM e TREX ed) is a chemotherapy drug. This medicine affects cells that are rapidly growing, such as cancer cells and cells in your mouth and stomach. It is usually used to treat lung cancers like non-small cell lung cancer and mesothelioma. It may also be used to treat other cancers. This medicine may be used for other purposes; ask your health care provider or pharmacist if you have questions. COMMON BRAND NAME(S): Alimta What should I tell my health care provider before I take this medicine? They need to know if you have any of these conditions: -if you frequently drink alcohol containing beverages -infection (especially a virus infection such as chickenpox, cold sores, or herpes) -kidney disease -liver disease -low blood counts, like low platelets, red bloods, or white blood cells -an  unusual or allergic reaction to pemetrexed, mannitol, other medicines, foods, dyes, or preservatives -pregnant or trying to get pregnant -breast-feeding How should I use this medicine? This drug is given as an infusion into a vein. It is administered in a hospital or clinic by a specially trained health care professional. Talk to your pediatrician regarding the use of this medicine in children. Special care may be needed. Overdosage: If you think you have taken too much of this medicine contact a poison control center or emergency room at once. NOTE: This medicine is only for you. Do not share this medicine with others. What if I miss a dose? It is important not to miss your dose. Call your doctor or health care professional if you are unable to keep an appointment. What may interact with this medicine? -aspirin and aspirin-like medicines -medicines to increase blood counts like filgrastim, pegfilgrastim, sargramostim -methotrexate -NSAIDS, medicines for pain and inflammation, like ibuprofen or naproxen -probenecid -pyrimethamine -vaccines Talk to your doctor or health care professional before taking any of these medicines: -acetaminophen -aspirin -ibuprofen -ketoprofen -naproxen This list may not describe all possible interactions. Give your health care provider a list of all the medicines, herbs, non-prescription drugs, or dietary supplements you use. Also tell them if you smoke, drink alcohol, or use illegal drugs. Some items may interact with your medicine. What should I watch for while using this medicine? Visit your doctor for checks on your progress. This drug may make you feel generally unwell. This is not uncommon, as chemotherapy can affect healthy cells as well as cancer cells. Report any side effects. Continue your course  of treatment even though you feel ill unless your doctor tells you to stop. In some cases, you may be given additional medicines to help with side effects.  Follow all directions for their use. Call your doctor or health care professional for advice if you get a fever, chills or sore throat, or other symptoms of a cold or flu. Do not treat yourself. This drug decreases your body's ability to fight infections. Try to avoid being around people who are sick. This medicine may increase your risk to bruise or bleed. Call your doctor or health care professional if you notice any unusual bleeding. Be careful brushing and flossing your teeth or using a toothpick because you may get an infection or bleed more easily. If you have any dental work done, tell your dentist you are receiving this medicine. Avoid taking products that contain aspirin, acetaminophen, ibuprofen, naproxen, or ketoprofen unless instructed by your doctor. These medicines may hide a fever. Call your doctor or health care professional if you get diarrhea or mouth sores. Do not treat yourself. To protect your kidneys, drink water or other fluids as directed while you are taking this medicine. Men and women must use effective birth control while taking this medicine. You may also need to continue using effective birth control for a time after stopping this medicine. Do not become pregnant while taking this medicine. Tell your doctor right away if you think that you or your partner might be pregnant. There is a potential for serious side effects to an unborn child. Talk to your health care professional or pharmacist for more information. Do not breast-feed an infant while taking this medicine. This medicine may lower sperm counts. What side effects may I notice from receiving this medicine? Side effects that you should report to your doctor or health care professional as soon as possible: -allergic reactions like skin rash, itching or hives, swelling of the face, lips, or tongue -low blood counts - this medicine may decrease the number of white blood cells, red blood cells and platelets. You may be at  increased risk for infections and bleeding. -signs of infection - fever or chills, cough, sore throat, pain or difficulty passing urine -signs of decreased platelets or bleeding - bruising, pinpoint red spots on the skin, black, tarry stools, blood in the urine -signs of decreased red blood cells - unusually weak or tired, fainting spells, lightheadedness -breathing problems, like a dry cough -changes in emotions or moods -chest pain -confusion -diarrhea -high blood pressure -mouth or throat sores or ulcers -pain, swelling, warmth in the leg -pain on swallowing -swelling of the ankles, feet, hands -trouble passing urine or change in the amount of urine -vomiting -yellowing of the eyes or skin Side effects that usually do not require medical attention (report to your doctor or health care professional if they continue or are bothersome): -hair loss -loss of appetite -nausea -stomach upset This list may not describe all possible side effects. Call your doctor for medical advice about side effects. You may report side effects to FDA at 1-800-FDA-1088. Where should I keep my medicine? This drug is given in a hospital or clinic and will not be stored at home. NOTE: This sheet is a summary. It may not cover all possible information. If you have questions about this medicine, talk to your doctor, pharmacist, or health care provider.  2017 Elsevier/Gold Standard (2008-04-18 13:24:03)

## 2016-11-26 ENCOUNTER — Ambulatory Visit: Payer: Medicaid Other

## 2016-11-26 ENCOUNTER — Other Ambulatory Visit: Payer: Medicaid Other

## 2016-11-26 ENCOUNTER — Ambulatory Visit: Payer: Medicaid Other | Admitting: Internal Medicine

## 2016-12-01 ENCOUNTER — Other Ambulatory Visit: Payer: Self-pay | Admitting: *Deleted

## 2016-12-01 ENCOUNTER — Encounter: Payer: Self-pay | Admitting: *Deleted

## 2016-12-01 DIAGNOSIS — C3411 Malignant neoplasm of upper lobe, right bronchus or lung: Secondary | ICD-10-CM

## 2016-12-01 MED ORDER — PROCHLORPERAZINE MALEATE 10 MG PO TABS: 10.0000 mg | ORAL_TABLET | Freq: Four times a day (QID) | ORAL | 0 refills | Status: DC | PRN

## 2016-12-01 MED ORDER — ONDANSETRON 8 MG PO TBDP: 8.0000 mg | ORAL_TABLET | Freq: Three times a day (TID) | ORAL | 1 refills | Status: DC | PRN

## 2016-12-01 NOTE — Telephone Encounter (Signed)
Patient calling to say he had nausea after his last chemo. States he did not take antiemetics. And  Is out of zofran. And is constipated. Per dr Julien Nordmann, zofran and compazine called to patient's pharmacy and instructed to start miralax for constipation. Patient verbalized understanding.

## 2016-12-10 ENCOUNTER — Encounter: Payer: Self-pay | Admitting: Internal Medicine

## 2016-12-10 ENCOUNTER — Other Ambulatory Visit (HOSPITAL_BASED_OUTPATIENT_CLINIC_OR_DEPARTMENT_OTHER): Payer: Medicaid Other

## 2016-12-10 ENCOUNTER — Ambulatory Visit (HOSPITAL_BASED_OUTPATIENT_CLINIC_OR_DEPARTMENT_OTHER): Payer: Medicaid Other

## 2016-12-10 ENCOUNTER — Ambulatory Visit (HOSPITAL_BASED_OUTPATIENT_CLINIC_OR_DEPARTMENT_OTHER): Payer: Medicaid Other | Admitting: Internal Medicine

## 2016-12-10 ENCOUNTER — Ambulatory Visit: Payer: Medicaid Other | Admitting: Nutrition

## 2016-12-10 VITALS — BP 138/71 | HR 102 | Temp 97.8°F | Resp 18 | Ht 74.0 in | Wt 167.6 lb

## 2016-12-10 VITALS — BP 105/65 | HR 86

## 2016-12-10 DIAGNOSIS — C7951 Secondary malignant neoplasm of bone: Secondary | ICD-10-CM

## 2016-12-10 DIAGNOSIS — Z5111 Encounter for antineoplastic chemotherapy: Secondary | ICD-10-CM

## 2016-12-10 DIAGNOSIS — Z720 Tobacco use: Secondary | ICD-10-CM

## 2016-12-10 DIAGNOSIS — Z5112 Encounter for antineoplastic immunotherapy: Secondary | ICD-10-CM

## 2016-12-10 DIAGNOSIS — C3411 Malignant neoplasm of upper lobe, right bronchus or lung: Secondary | ICD-10-CM | POA: Diagnosis not present

## 2016-12-10 LAB — COMPREHENSIVE METABOLIC PANEL
ALK PHOS: 92 U/L (ref 40–150)
ALT: 29 U/L (ref 0–55)
ANION GAP: 9 meq/L (ref 3–11)
AST: 31 U/L (ref 5–34)
Albumin: 3.3 g/dL — ABNORMAL LOW (ref 3.5–5.0)
BILIRUBIN TOTAL: 0.35 mg/dL (ref 0.20–1.20)
BUN: 16.7 mg/dL (ref 7.0–26.0)
CO2: 27 meq/L (ref 22–29)
Calcium: 10 mg/dL (ref 8.4–10.4)
Chloride: 106 mEq/L (ref 98–109)
Creatinine: 0.9 mg/dL (ref 0.7–1.3)
Glucose: 76 mg/dl (ref 70–140)
POTASSIUM: 4.6 meq/L (ref 3.5–5.1)
Sodium: 142 mEq/L (ref 136–145)
TOTAL PROTEIN: 7.6 g/dL (ref 6.4–8.3)

## 2016-12-10 LAB — CBC WITH DIFFERENTIAL/PLATELET
BASO%: 0.5 % (ref 0.0–2.0)
BASOS ABS: 0 10*3/uL (ref 0.0–0.1)
EOS ABS: 0.1 10*3/uL (ref 0.0–0.5)
EOS%: 2.8 % (ref 0.0–7.0)
HCT: 32.1 % — ABNORMAL LOW (ref 38.4–49.9)
HGB: 10.3 g/dL — ABNORMAL LOW (ref 13.0–17.1)
LYMPH%: 12.6 % — AB (ref 14.0–49.0)
MCH: 32.9 pg (ref 27.2–33.4)
MCHC: 32 g/dL (ref 32.0–36.0)
MCV: 103 fL — AB (ref 79.3–98.0)
MONO#: 0.6 10*3/uL (ref 0.1–0.9)
MONO%: 15.7 % — ABNORMAL HIGH (ref 0.0–14.0)
NEUT%: 68.4 % (ref 39.0–75.0)
NEUTROS ABS: 2.6 10*3/uL (ref 1.5–6.5)
PLATELETS: 181 10*3/uL (ref 140–400)
RBC: 3.12 10*6/uL — AB (ref 4.20–5.82)
RDW: 23.2 % — ABNORMAL HIGH (ref 11.0–14.6)
WBC: 3.8 10*3/uL — ABNORMAL LOW (ref 4.0–10.3)
lymph#: 0.5 10*3/uL — ABNORMAL LOW (ref 0.9–3.3)

## 2016-12-10 LAB — UA PROTEIN, DIPSTICK - CHCC: Protein, ur: NEGATIVE mg/dL

## 2016-12-10 MED ORDER — SODIUM CHLORIDE 0.9 % IV SOLN
Freq: Once | INTRAVENOUS | Status: AC
  Administered 2016-12-10: 10:00:00 via INTRAVENOUS

## 2016-12-10 MED ORDER — PROCHLORPERAZINE MALEATE 10 MG PO TABS
ORAL_TABLET | ORAL | Status: AC
  Filled 2016-12-10: qty 1

## 2016-12-10 MED ORDER — PROCHLORPERAZINE MALEATE 10 MG PO TABS
10.0000 mg | ORAL_TABLET | Freq: Once | ORAL | Status: AC
  Administered 2016-12-10: 10 mg via ORAL

## 2016-12-10 MED ORDER — SODIUM CHLORIDE 0.9 % IV SOLN
14.0000 mg/kg | Freq: Once | INTRAVENOUS | Status: AC
  Administered 2016-12-10: 1200 mg via INTRAVENOUS
  Filled 2016-12-10: qty 48

## 2016-12-10 MED ORDER — SODIUM CHLORIDE 0.9 % IV SOLN
500.0000 mg/m2 | Freq: Once | INTRAVENOUS | Status: AC
  Administered 2016-12-10: 1000 mg via INTRAVENOUS
  Filled 2016-12-10: qty 40

## 2016-12-10 NOTE — Patient Instructions (Signed)
[facility] Discharge Instructions for Patients Receiving Chemotherapy  Today you received the following chemotherapy agents:  Avastin and Alimta.  To help prevent nausea and vomiting after your treatment, we encourage you to take your nausea medication as directed.   If you develop nausea and vomiting that is not controlled by your nausea medication, call the clinic.   BELOW ARE SYMPTOMS THAT SHOULD BE REPORTED IMMEDIATELY:  *FEVER GREATER THAN 100.5 F  *CHILLS WITH OR WITHOUT FEVER  NAUSEA AND VOMITING THAT IS NOT CONTROLLED WITH YOUR NAUSEA MEDICATION  *UNUSUAL SHORTNESS OF BREATH  *UNUSUAL BRUISING OR BLEEDING  TENDERNESS IN MOUTH AND THROAT WITH OR WITHOUT PRESENCE OF ULCERS  *URINARY PROBLEMS  *BOWEL PROBLEMS  UNUSUAL RASH Items with * indicate a potential emergency and should be followed up as soon as possible.  Feel free to call the clinic you have any questions or concerns. The clinic phone number is [phone].  Please show the CHEMO ALERT CARD at check-in to the Emergency Department and triage nurse.   

## 2016-12-10 NOTE — Progress Notes (Signed)
Nutrition follow-up completed with patient receiving treatment for non-small cell lung cancer. Weight documented as 167.6 pounds March 12 decreased from 169.6 pounds January 17. Patient reports his taste has improved. Reports a history of constipation but has now resolved.    Nutrition diagnosis: Severe malnutrition continues.  Intervention: I reviewed strategies for increasing calories and protein in small frequent meals and snacks. Recommended patient consume one oral nutrition shake daily. Suggested patient may want to try prune juice for his constipation. Questions were answered.  Teach back method used.  Monitoring, evaluation, goals: Patient will work to increase calories and protein to minimize weight loss.  Next visit: Wednesday, April 25, during infusion.  **Disclaimer: This note was dictated with voice recognition software. Similar sounding words can inadvertently be transcribed and this note may contain transcription errors which may not have been corrected upon publication of note.**

## 2016-12-10 NOTE — Progress Notes (Signed)
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  GUARDANT 360: Negative for EGFR, ALK, ROS 1, BRAF mutations but showed amplification of EGFR and MET  PRIOR THERAPY: 1) Palliative systemic chemotherapy to the right superior mediastinal and right paratracheal lymphadenopathy under the care of Dr. Lisbeth Renshaw. 2) Systemic chemotherapy with carboplatin for AUC of 5, Alimta 500 MG/M2 and Avastin 15 MG/KG every 3 weeks is status post 6 cycles. First cycle was given on 07/28/2016.   CURRENT THERAPY: Maintenance systemic chemotherapy with Alimta 500 MG/M2 and Avastin 15 MG/KG every 3 weeks. First dose 11/19/2016. Status post one cycle.  INTERVAL HISTORY: Edward Spence 61 y.o. male came to the clinic today for follow-up visit. The patient tolerated the first cycle of his maintenance treatment fairly well except for few episodes of nausea and he did not take his nausea medicine. He also has mild fatigue. He denied having any chest pain but continues to have shortness breath and mild cough with no hemoptysis. He denied having any fever or chills. He has no current nausea, vomiting, diarrhea or constipation. He fell 2 days ago while he was going out to smoke a cigarette and slipped on the snow. He did not have any fracture or bruises. The patient is today for evaluation before starting cycle #2 of his treatment.  MEDICAL HISTORY: Past Medical History:  Diagnosis Date  . Constipation 07/23/2016  . Encounter for antineoplastic chemotherapy 06/24/2016  . Lung mass 05/19/2016  . Pneumonia    in college     ALLERGIES:  is allergic to penicillins.  MEDICATIONS:  Current Outpatient Prescriptions  Medication Sig Dispense Refill  . dexamethasone (DECADRON) 4 MG tablet 4 mg by mouth twice a day the day before, day of and day after the chemotherapy every 3 weeks 40 tablet 1  . folic acid (FOLVITE) 1 MG tablet Take 1 tablet (1 mg total) by mouth daily. 30 tablet 4  . morphine (MS CONTIN) 30 MG 12 hr tablet Take 1 tablet (30 mg total) by mouth every 12 (twelve) hours. 60 tablet 0  . ondansetron (ZOFRAN ODT) 8 MG disintegrating tablet Take 1 tablet (8 mg total) by mouth every 8 (eight) hours as needed for nausea or vomiting. 30 tablet 1  . oxyCODONE (OXY IR/ROXICODONE) 5 MG immediate release tablet Take 1-2 tablets (5-10 mg total) by mouth every 4 (four) hours as needed for severe pain. (Patient not taking: Reported on 10/15/2016) 45 tablet 0  . oxyCODONE-acetaminophen (PERCOCET/ROXICET) 5-325 MG tablet Take 1-2 tablets by mouth every 6 (six) hours as needed for severe pain. (Patient not taking: Reported on 10/15/2016) 45 tablet 0  . prochlorperazine (COMPAZINE) 10 MG tablet Take 1 tablet (10 mg total) by mouth every 6 (six) hours as needed for nausea or vomiting. 30 tablet 0   No current facility-administered medications for this visit.     SURGICAL HISTORY:  Past Surgical History:  Procedure Laterality Date  . APPENDECTOMY    . VIDEO BRONCHOSCOPY WITH ENDOBRONCHIAL ULTRASOUND N/A 05/22/2016   Procedure: VIDEO BRONCHOSCOPY WITH ENDOBRONCHIAL ULTRASOUND;  Surgeon: [name], MD;  Location: [facility];  Service: Thoracic;  Laterality: N/A;    REVIEW OF SYSTEMS:  A comprehensive review of systems was negative except for: Constitutional: positive for fatigue Respiratory: positive for cough and dyspnea on exertion   PHYSICAL EXAMINATION: General appearance: alert, cooperative, fatigued and no distress Head: Normocephalic, without obvious abnormality, atraumatic Neck: no adenopathy, no JVD,  supple, symmetrical, trachea midline and thyroid not enlarged, symmetric, no tenderness/mass/nodules Lymph nodes: Cervical, supraclavicular, and axillary nodes normal. Resp: clear to auscultation bilaterally Back: symmetric, no curvature. ROM normal. No CVA tenderness. Cardio: regular rate and rhythm, S1, S2 normal, no murmur, click, rub or gallop GI: soft, non-tender; bowel sounds normal; no masses,  no organomegaly Extremities: extremities normal, atraumatic, no cyanosis or edema  ECOG PERFORMANCE STATUS: 1 - Symptomatic but completely ambulatory  Blood pressure 138/71, pulse (!) 102, temperature 97.8 F (36.6 C), temperature source Oral, resp. rate 18, height _0  (1.88 m), weight 167 lb 9.6 oz (76 kg), SpO2 100 %.  LABORATORY DATA: Lab Results  Component Value Date   WBC 3.8 (L) 12/10/2016   HGB 10.3 (L) 12/10/2016   HCT 32.1 (L) 12/10/2016   MCV 103.0 (H) 12/10/2016   PLT 181 12/10/2016      Chemistry      Component Value Date/Time   NA 141 11/19/2016 0923   K 4.2 11/19/2016 0923   CL 107 05/22/2016 0641   CO2 26 11/19/2016 0923   BUN 11.9 11/19/2016 0923   CREATININE 0.8 11/19/2016 0923      Component Value Date/Time   CALCIUM 10.2 11/19/2016 0923   ALKPHOS 82 11/19/2016 0923   AST 21 11/19/2016 0923   ALT 14 11/19/2016 0923   BILITOT 0.32 11/19/2016 0923       RADIOGRAPHIC STUDIES: No results found.  ASSESSMENT AND PLAN:  This is a very pleasant 61 years old white male with metastatic non-small cell lung cancer, adenocarcinoma status post 6 cycles of induction systemic chemotherapy was carboplatin, Alimta and Avastin with partial response. He is currently undergoing maintenance treatment with Alimta and Avastin status post 1 cycle. He tolerated the first cycle of his treatment well. I recommended for him to proceed with cycle #2 today as a scheduled. For the metastatic bone disease, I will start the patient on treatment with Xgeva starting from the next  cycle. He does not have any dental issues and he has dentures. I will see him back for follow-up visit in 3 weeks with the next cycle of his treatment. He was advised to call immediately if he has any concerning symptoms in the interval. The patient voices understanding of current disease status and treatment options and is in agreement with the current care plan.  All questions were answered. The patient knows to call the clinic with any problems, questions or concerns. We can certainly see the patient much sooner if necessary. I spent 10 minutes counseling the patient face to face. The total time spent in the appointment was 15 minutes.  Disclaimer: This note was dictated with voice recognition software. Similar sounding words can inadvertently be transcribed and may not be corrected upon review.

## 2016-12-31 ENCOUNTER — Ambulatory Visit: Payer: Medicaid Other

## 2016-12-31 ENCOUNTER — Ambulatory Visit (HOSPITAL_BASED_OUTPATIENT_CLINIC_OR_DEPARTMENT_OTHER): Payer: Medicaid Other | Admitting: Internal Medicine

## 2016-12-31 ENCOUNTER — Encounter: Payer: Self-pay | Admitting: Internal Medicine

## 2016-12-31 ENCOUNTER — Ambulatory Visit (HOSPITAL_BASED_OUTPATIENT_CLINIC_OR_DEPARTMENT_OTHER): Payer: Medicaid Other

## 2016-12-31 ENCOUNTER — Other Ambulatory Visit (HOSPITAL_BASED_OUTPATIENT_CLINIC_OR_DEPARTMENT_OTHER): Payer: Medicaid Other

## 2016-12-31 DIAGNOSIS — C7951 Secondary malignant neoplasm of bone: Secondary | ICD-10-CM

## 2016-12-31 DIAGNOSIS — G893 Neoplasm related pain (acute) (chronic): Secondary | ICD-10-CM | POA: Diagnosis not present

## 2016-12-31 DIAGNOSIS — Z5111 Encounter for antineoplastic chemotherapy: Secondary | ICD-10-CM | POA: Diagnosis not present

## 2016-12-31 DIAGNOSIS — C3411 Malignant neoplasm of upper lobe, right bronchus or lung: Secondary | ICD-10-CM

## 2016-12-31 DIAGNOSIS — R530 Neoplastic (malignant) related fatigue: Secondary | ICD-10-CM | POA: Diagnosis not present

## 2016-12-31 DIAGNOSIS — Z5112 Encounter for antineoplastic immunotherapy: Secondary | ICD-10-CM

## 2016-12-31 DIAGNOSIS — D702 Other drug-induced agranulocytosis: Secondary | ICD-10-CM

## 2016-12-31 LAB — COMPREHENSIVE METABOLIC PANEL
ALBUMIN: 2.9 g/dL — AB (ref 3.5–5.0)
ALK PHOS: 94 U/L (ref 40–150)
ALT: 18 U/L (ref 0–55)
AST: 27 U/L (ref 5–34)
Anion Gap: 13 mEq/L — ABNORMAL HIGH (ref 3–11)
BUN: 12.8 mg/dL (ref 7.0–26.0)
CO2: 23 mEq/L (ref 22–29)
CREATININE: 0.9 mg/dL (ref 0.7–1.3)
Calcium: 10.1 mg/dL (ref 8.4–10.4)
Chloride: 105 mEq/L (ref 98–109)
EGFR: >90 mL/min/{1.73_m2} (ref >90)
GLUCOSE: 121 mg/dL (ref 70–140)
POTASSIUM: 4.6 meq/L (ref 3.5–5.1)
SODIUM: 141 meq/L (ref 136–145)
TOTAL PROTEIN: 7.8 g/dL (ref 6.4–8.3)
Total Bilirubin: 0.54 mg/dL (ref 0.20–1.20)

## 2016-12-31 LAB — CBC WITH DIFFERENTIAL/PLATELET
BASO%: 0.2 % (ref 0.0–2.0)
BASOS ABS: 0 10*3/uL (ref 0.0–0.1)
EOS%: 0.4 % (ref 0.0–7.0)
Eosinophils Absolute: 0 10*3/uL (ref 0.0–0.5)
HCT: 28.5 % — ABNORMAL LOW (ref 38.4–49.9)
HGB: 9.2 g/dL — ABNORMAL LOW (ref 13.0–17.1)
LYMPH%: 5.7 % — ABNORMAL LOW (ref 14.0–49.0)
MCH: 33.4 pg (ref 27.2–33.4)
MCHC: 32.4 g/dL (ref 32.0–36.0)
MCV: 102.9 fL — ABNORMAL HIGH (ref 79.3–98.0)
MONO#: 0.9 10*3/uL (ref 0.1–0.9)
MONO%: 14.2 % — AB (ref 0.0–14.0)
NEUT#: 5.3 10*3/uL (ref 1.5–6.5)
NEUT%: 79.5 % — AB (ref 39.0–75.0)
Platelets: 217 10*3/uL (ref 140–400)
RBC: 2.77 10*6/uL — AB (ref 4.20–5.82)
RDW: 21.9 % — ABNORMAL HIGH (ref 11.0–14.6)
WBC: 6.6 10*3/uL (ref 4.0–10.3)
lymph#: 0.4 10*3/uL — ABNORMAL LOW (ref 0.9–3.3)

## 2016-12-31 LAB — UA PROTEIN, DIPSTICK - CHCC: Protein, ur: <30 mg/dL

## 2016-12-31 MED ORDER — MORPHINE SULFATE ER 30 MG PO TBCR: 30.0000 mg | EXTENDED_RELEASE_TABLET | Freq: Two times a day (BID) | ORAL | 0 refills | Status: DC

## 2016-12-31 MED ORDER — SODIUM CHLORIDE 0.9 % IV SOLN
Freq: Once | INTRAVENOUS | Status: AC
  Administered 2016-12-31: 11:00:00 via INTRAVENOUS

## 2016-12-31 MED ORDER — PROCHLORPERAZINE MALEATE 10 MG PO TABS
ORAL_TABLET | ORAL | Status: AC
  Filled 2016-12-31: qty 1

## 2016-12-31 MED ORDER — PROCHLORPERAZINE MALEATE 10 MG PO TABS
10.0000 mg | ORAL_TABLET | Freq: Once | ORAL | Status: AC
  Administered 2016-12-31: 10 mg via ORAL

## 2016-12-31 MED ORDER — CYANOCOBALAMIN 1000 MCG/ML IJ SOLN
1000.0000 ug | Freq: Once | INTRAMUSCULAR | Status: AC
  Administered 2016-12-31: 1000 ug via INTRAMUSCULAR

## 2016-12-31 MED ORDER — DENOSUMAB 120 MG/1.7ML ~~LOC~~ SOLN
120.0000 mg | Freq: Once | SUBCUTANEOUS | Status: AC
  Administered 2016-12-31: 120 mg via SUBCUTANEOUS
  Filled 2016-12-31: qty 1.7

## 2016-12-31 MED ORDER — CYANOCOBALAMIN 1000 MCG/ML IJ SOLN
INTRAMUSCULAR | Status: AC
  Filled 2016-12-31: qty 1

## 2016-12-31 MED ORDER — SODIUM CHLORIDE 0.9 % IV SOLN
500.0000 mg/m2 | Freq: Once | INTRAVENOUS | Status: AC
  Administered 2016-12-31: 1000 mg via INTRAVENOUS
  Filled 2016-12-31: qty 40

## 2016-12-31 MED ORDER — SODIUM CHLORIDE 0.9 % IV SOLN
1200.0000 mg | Freq: Once | INTRAVENOUS | Status: AC
  Administered 2016-12-31: 1200 mg via INTRAVENOUS
  Filled 2016-12-31: qty 48

## 2016-12-31 NOTE — Patient Instructions (Addendum)
[facility] Discharge Instructions for Patients Receiving Chemotherapy  Today you received the following chemotherapy agents: Avastin and Alimta  To help prevent nausea and vomiting after your treatment, we encourage you to take your nausea medication: Compazine. Take one every 6 hours as needed.  If you develop nausea and vomiting that is not controlled by your nausea medication, call the clinic.   BELOW ARE SYMPTOMS THAT SHOULD BE REPORTED IMMEDIATELY:  *FEVER GREATER THAN 100.5 F  *CHILLS WITH OR WITHOUT FEVER  NAUSEA AND VOMITING THAT IS NOT CONTROLLED WITH YOUR NAUSEA MEDICATION  *UNUSUAL SHORTNESS OF BREATH  *UNUSUAL BRUISING OR BLEEDING  TENDERNESS IN MOUTH AND THROAT WITH OR WITHOUT PRESENCE OF ULCERS  *URINARY PROBLEMS  *BOWEL PROBLEMS  UNUSUAL RASH Items with * indicate a potential emergency and should be followed up as soon as possible.  Feel free to call the clinic should you have any questions or concerns. The clinic phone number is [phone].  Please show the [facility] at check-in to the Emergency Department and triage nurse.  Denosumab injection What is this medicine? DENOSUMAB (den oh sue mab) slows bone breakdown. Prolia is used to treat osteoporosis in women after menopause and in men. Delton See is used to treat a high calcium level due to cancer and to prevent bone fractures and other bone problems caused by multiple myeloma or cancer bone metastases. Delton See is also used to treat giant cell tumor of the bone. This medicine may be used for other purposes; ask your health care provider or pharmacist if you have questions. COMMON BRAND NAME(S): Prolia, XGEVA What should I tell my health care provider before I take this medicine? They need to know if you have any of these conditions: -dental disease -having surgery or tooth extraction -infection -kidney disease -low levels of calcium or Vitamin D in the blood -malnutrition -on  hemodialysis -skin conditions or sensitivity -thyroid or parathyroid disease -an unusual reaction to denosumab, other medicines, foods, dyes, or preservatives -pregnant or trying to get pregnant -breast-feeding How should I use this medicine? This medicine is for injection under the skin. It is given by a health care professional in a hospital or clinic setting. If you are getting Prolia, a special MedGuide will be given to you by the pharmacist with each prescription and refill. Be sure to read this information carefully each time. For Prolia, talk to your pediatrician regarding the use of this medicine in children. Special care may be needed. For Delton See, talk to your pediatrician regarding the use of this medicine in children. While this drug may be prescribed for children as young as 13 years for selected conditions, precautions do apply. Overdosage: If you think you have taken too much of this medicine contact a poison control center or emergency room at once. NOTE: This medicine is only for you. Do not share this medicine with others. What if I miss a dose? It is important not to miss your dose. Call your doctor or health care professional if you are unable to keep an appointment. What may interact with this medicine? Do not take this medicine with any of the following medications: -other medicines containing denosumab This medicine may also interact with the following medications: -medicines that lower your chance of fighting infection -steroid medicines like prednisone or cortisone This list may not describe all possible interactions. Give your health care provider a list of all the medicines, herbs, non-prescription drugs, or dietary supplements you use. Also tell them if  you smoke, drink alcohol, or use illegal drugs. Some items may interact with your medicine. What should I watch for while using this medicine? Visit your doctor or health care professional for regular checks on your  progress. Your doctor or health care professional may order blood tests and other tests to see how you are doing. Call your doctor or health care professional for advice if you get a fever, chills or sore throat, or other symptoms of a cold or flu. Do not treat yourself. This drug may decrease your body's ability to fight infection. Try to avoid being around people who are sick. You should make sure you get enough calcium and vitamin D while you are taking this medicine, unless your doctor tells you not to. Discuss the foods you eat and the vitamins you take with your health care professional. See your dentist regularly. Brush and floss your teeth as directed. Before you have any dental work done, tell your dentist you are receiving this medicine. Do not become pregnant while taking this medicine or for 5 months after stopping it. Talk with your doctor or health care professional about your birth control options while taking this medicine. Women should inform their doctor if they wish to become pregnant or think they might be pregnant. There is a potential for serious side effects to an unborn child. Talk to your health care professional or pharmacist for more information. What side effects may I notice from receiving this medicine? Side effects that you should report to your doctor or health care professional as soon as possible: -allergic reactions like skin rash, itching or hives, swelling of the face, lips, or tongue -bone pain -breathing problems -dizziness -jaw pain, especially after dental work -redness, blistering, peeling of the skin -signs and symptoms of infection like fever or chills; cough; sore throat; pain or trouble passing urine -signs of low calcium like fast heartbeat, muscle cramps or muscle pain; pain, tingling, numbness in the hands or feet; seizures -unusual bleeding or bruising -unusually weak or tired Side effects that usually do not require medical attention (report to your  doctor or health care professional if they continue or are bothersome): -constipation -diarrhea -headache -joint pain -loss of appetite -muscle pain -runny nose -tiredness -upset stomach This list may not describe all possible side effects. Call your doctor for medical advice about side effects. You may report side effects to FDA at 1-800-FDA-1088. Where should I keep my medicine? This medicine is only given in a clinic, doctor's office, or other health care setting and will not be stored at home. NOTE: This sheet is a summary. It may not cover all possible information. If you have questions about this medicine, talk to your doctor, pharmacist, or health care provider.  2018 Elsevier/Gold Standard (2016-10-07 19:17:21)

## 2016-12-31 NOTE — Progress Notes (Signed)
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  GUARDANT 360: Negative for EGFR, ALK, ROS 1, BRAF mutations but showed amplification of EGFR and MET  PRIOR THERAPY: 1) Palliative systemic chemotherapy to the right superior mediastinal and right paratracheal lymphadenopathy under the care of Dr. Lisbeth Renshaw. 2) Systemic chemotherapy with carboplatin for AUC of 5, Alimta 500 MG/M2 and Avastin 15 MG/KG every 3 weeks is status post 6 cycles. First cycle was given on 07/28/2016.   CURRENT THERAPY: Maintenance systemic chemotherapy with Alimta 500 MG/M2 and Avastin 15 MG/KG every 3 weeks. First dose 11/19/2016. Status post 2 cycles.  INTERVAL HISTORY: Edward Spence 61 y.o. male returns to the clinic today for follow-up visit. The patient is feeling fine except for fatigue and occasional shortness breath with exertion. He also has mild cough. He denied having any weight loss or night sweats. He has no nausea, vomiting, diarrhea or constipation. He denied having any fever or chills. He is here today for evaluation before starting cycle #3 of his treatment.  MEDICAL HISTORY: Past Medical History:  Diagnosis Date  . Constipation 07/23/2016  . Encounter for antineoplastic chemotherapy 06/24/2016  . Lung mass 05/19/2016  . Pneumonia    in college    ALLERGIES:  is allergic to penicillins.  MEDICATIONS:  Current Outpatient Prescriptions  Medication Sig Dispense Refill  . dexamethasone (DECADRON) 4 MG tablet 4 mg by mouth twice a day the day before, day of and day after the chemotherapy every 3 weeks 40  tablet 1  . folic acid (FOLVITE) 1 MG tablet Take 1 tablet (1 mg total) by mouth daily. 30 tablet 4  . morphine (MS CONTIN) 30 MG 12 hr tablet Take 1 tablet (30 mg total) by mouth every 12 (twelve) hours. 60 tablet 0  . ondansetron (ZOFRAN ODT) 8 MG disintegrating tablet Take 1 tablet (8 mg total) by mouth every 8 (eight) hours as needed for nausea or vomiting. 30 tablet 1  . oxyCODONE (OXY IR/ROXICODONE) 5 MG immediate release tablet Take 1-2 tablets (5-10 mg total) by mouth every 4 (four) hours as needed for severe pain. 45 tablet 0  . prochlorperazine (COMPAZINE) 10 MG tablet Take 1 tablet (10 mg total) by mouth every 6 (six) hours as needed for nausea or vomiting. (Patient not taking: Reported on 12/31/2016) 30 tablet 0   No current facility-administered medications for this visit.     SURGICAL HISTORY:  Past Surgical History:  Procedure Laterality Date  . APPENDECTOMY    . VIDEO BRONCHOSCOPY WITH ENDOBRONCHIAL ULTRASOUND N/A 05/22/2016   Procedure: VIDEO BRONCHOSCOPY WITH ENDOBRONCHIAL ULTRASOUND;  Surgeon: [name], MD;  Location: [facility];  Service: Thoracic;  Laterality: N/A;    REVIEW OF SYSTEMS:  A comprehensive review of systems was negative except for: Constitutional: positive for fatigue Respiratory: positive for cough and dyspnea on exertion   PHYSICAL EXAMINATION: General appearance: alert, cooperative, fatigued and no distress Head: Normocephalic, without obvious abnormality, atraumatic Neck: no adenopathy, no JVD, supple, symmetrical, trachea midline and thyroid not enlarged, symmetric, no tenderness/mass/nodules Lymph nodes: Cervical, supraclavicular, and axillary nodes normal. Resp: clear to  auscultation bilaterally Back: symmetric, no curvature. ROM normal. No CVA tenderness. Cardio: regular rate and rhythm, S1, S2 normal, no murmur, click, rub or gallop GI: soft, non-tender; bowel sounds normal; no masses,  no organomegaly Extremities: extremities normal,  atraumatic, no cyanosis or edema  ECOG PERFORMANCE STATUS: 1 - Symptomatic but completely ambulatory  Blood pressure 122/62, pulse (!) 113, temperature 98.6 F (37 C), temperature source Oral, resp. rate 19, weight 165 lb (74.8 kg), SpO2 100 %.  LABORATORY DATA: Lab Results  Component Value Date   WBC 6.6 12/31/2016   HGB 9.2 (L) 12/31/2016   HCT 28.5 (L) 12/31/2016   MCV 102.9 (H) 12/31/2016   PLT 217 12/31/2016      Chemistry      Component Value Date/Time   NA 141 12/31/2016 0932   K 4.6 12/31/2016 0932   CL 107 05/22/2016 0641   CO2 23 12/31/2016 0932   BUN 12.8 12/31/2016 0932   CREATININE 0.9 12/31/2016 0932      Component Value Date/Time   CALCIUM 10.1 12/31/2016 0932   ALKPHOS 94 12/31/2016 0932   AST 27 12/31/2016 0932   ALT 18 12/31/2016 0932   BILITOT 0.54 12/31/2016 0932       RADIOGRAPHIC STUDIES: No results found.  ASSESSMENT AND PLAN:  This is a very pleasant 61 years old white male with metastatic non-small cell lung cancer, adenocarcinoma status post 6 cycles of induction systemic chemotherapy with carboplatin, Alimta and Avastin with partial response He is currently on maintenance treatment with Alimta and Avastin status post 2 cycles. The patient has been tolerating his treatment well except for fatigue. I recommended for him to proceed with cycle #3 today as a scheduled. I will see him back for follow-up visit in 3 weeks for evaluation after repeating CT scan of the chest, abdomen and pelvis for restaging of his disease. For pain management, I gave him a refill of MS Contin. He was advised to call immediately if he has any concerning symptoms in the interval. The patient voices understanding of current disease status and treatment options and is in agreement with the current care plan. All questions were answered. The patient knows to call the clinic with any problems, questions or concerns. We can certainly see the patient much sooner if  necessary. I spent 10 minutes counseling the patient face to face. The total time spent in the appointment was 15 minutes.  Disclaimer: This note was dictated with voice recognition software. Similar sounding words can inadvertently be transcribed and may not be corrected upon review.

## 2016-12-31 NOTE — Patient Instructions (Signed)
Steps to Quit Smoking Smoking tobacco can be bad for your health. It can also affect almost every organ in your body. Smoking puts you and people around you at risk for many serious long-lasting (chronic) diseases. Quitting smoking is hard, but it is one of the best things that you can do for your health. It is never too late to quit. What are the benefits of quitting smoking? When you quit smoking, you lower your risk for getting serious diseases and conditions. They can include:  Lung cancer or lung disease.  Heart disease.  Stroke.  Heart attack.  Not being able to have children (infertility).  Weak bones (osteoporosis) and broken bones (fractures). If you have coughing, wheezing, and shortness of breath, those symptoms may get better when you quit. You may also get sick less often. If you are pregnant, quitting smoking can help to lower your chances of having a baby of low birth weight. What can I do to help me quit smoking? Talk with your doctor about what can help you quit smoking. Some things you can do (strategies) include:  Quitting smoking totally, instead of slowly cutting back how much you smoke over a period of time.  Going to in-person counseling. You are more likely to quit if you go to many counseling sessions.  Using resources and support systems, such as:  Online chats with a counselor.  Phone quitlines.  Printed self-help materials.  Support groups or group counseling.  Text messaging programs.  Mobile phone apps or applications.  Taking medicines. Some of these medicines may have nicotine in them. If you are pregnant or breastfeeding, do not take any medicines to quit smoking unless your doctor says it is okay. Talk with your doctor about counseling or other things that can help you. Talk with your doctor about using more than one strategy at the same time, such as taking medicines while you are also going to in-person counseling. This can help make quitting  easier. What things can I do to make it easier to quit? Quitting smoking might feel very hard at first, but there is a lot that you can do to make it easier. Take these steps:  Talk to your family and friends. Ask them to support and encourage you.  Call phone quitlines, reach out to support groups, or work with a counselor.  Ask people who smoke to not smoke around you.  Avoid places that make you want (trigger) to smoke, such as:  Bars.  Parties.  Smoke-break areas at work.  Spend time with people who do not smoke.  Lower the stress in your life. Stress can make you want to smoke. Try these things to help your stress:  Getting regular exercise.  Deep-breathing exercises.  Yoga.  Meditating.  Doing a body scan. To do this, close your eyes, focus on one area of your body at a time from head to toe, and notice which parts of your body are tense. Try to relax the muscles in those areas.  Download or buy apps on your mobile phone or tablet that can help you stick to your quit plan. There are many free apps, such as QuitGuide from the CDC (Centers for Disease Control and Prevention). You can find more support from smokefree.gov and other websites. This information is not intended to replace advice given to you by your health care provider. Make sure you discuss any questions you have with your health care provider. Document Released: 07/12/2009 Document Revised: 05/13/2016 Document   Reviewed: 01/30/2015 Elsevier Interactive Patient Education  2017 Elsevier Inc.  

## 2017-01-01 ENCOUNTER — Telehealth: Payer: Self-pay | Admitting: Internal Medicine

## 2017-01-01 NOTE — Telephone Encounter (Signed)
Unable to reach patient - left message that additional appts are added and put note in next appt for patient to pick up a new schedule.

## 2017-01-06 ENCOUNTER — Telehealth: Payer: Self-pay | Admitting: *Deleted

## 2017-01-06 NOTE — Telephone Encounter (Signed)
Wife called asking if patient should still be taking decadron because he hasnt been taking it.  Spoke with wife.  Let her know that decadron is given to prevent skin rash with his alimta.  Patient stopped taking decadron because he did not think he was doing chemotherapy.  He said Dr. Julien Nordmann called it maintenance.  Let her know that the alimta is still chemotherapy and that he should take it as directed the day before, of and after chemotherapy.  She states he is very tired and doesn't have much appetite.  Let her know that the decadron sometimes gives people more energy and appetite and for him that would be a beneficial side effect. She states he will take it with his next chemotherapy and verbalized a better understanding that while it was still  "maintenance" it was also chemotherapy.  Discussed CT scan before next visit.  Let her know that I saw the order for this in his chart, but it was not scheduled yet.   If they do not hear from this by Monday 4/16, please give [place] a call.  She appreciated all the information and the prompt return of her phone call.

## 2017-01-19 ENCOUNTER — Ambulatory Visit (HOSPITAL_COMMUNITY)
Admission: RE | Admit: 2017-01-19 | Discharge: 2017-01-19 | Disposition: A | Discharge status: Home / Self Care | Payer: Medicaid Other | Source: Ambulatory Visit | Attending: Internal Medicine | Admitting: Internal Medicine

## 2017-01-19 ENCOUNTER — Encounter (HOSPITAL_COMMUNITY): Payer: Self-pay

## 2017-01-19 DIAGNOSIS — M899 Disorder of bone, unspecified: Secondary | ICD-10-CM | POA: Insufficient documentation

## 2017-01-19 DIAGNOSIS — R918 Other nonspecific abnormal finding of lung field: Secondary | ICD-10-CM | POA: Insufficient documentation

## 2017-01-19 DIAGNOSIS — C3411 Malignant neoplasm of upper lobe, right bronchus or lung: Secondary | ICD-10-CM | POA: Diagnosis present

## 2017-01-19 DIAGNOSIS — Z5111 Encounter for antineoplastic chemotherapy: Secondary | ICD-10-CM

## 2017-01-19 MED ORDER — IOPAMIDOL (ISOVUE-300) INJECTION 61%
INTRAVENOUS | Status: AC
  Administered 2017-01-19: 100 mL
  Filled 2017-01-19: qty 100

## 2017-01-21 ENCOUNTER — Encounter: Payer: Medicaid Other | Admitting: Nutrition

## 2017-01-21 ENCOUNTER — Ambulatory Visit (HOSPITAL_COMMUNITY)
Admission: RE | Admit: 2017-01-21 | Discharge: 2017-01-21 | Disposition: A | Discharge status: Home / Self Care | Payer: Medicaid Other | Source: Ambulatory Visit | Attending: Internal Medicine | Admitting: Internal Medicine

## 2017-01-21 ENCOUNTER — Encounter: Payer: Self-pay | Admitting: Internal Medicine

## 2017-01-21 ENCOUNTER — Other Ambulatory Visit: Payer: Self-pay | Admitting: Medical Oncology

## 2017-01-21 ENCOUNTER — Ambulatory Visit (HOSPITAL_BASED_OUTPATIENT_CLINIC_OR_DEPARTMENT_OTHER): Payer: Medicaid Other | Admitting: Internal Medicine

## 2017-01-21 ENCOUNTER — Telehealth: Payer: Self-pay | Admitting: Medical Oncology

## 2017-01-21 ENCOUNTER — Other Ambulatory Visit (HOSPITAL_BASED_OUTPATIENT_CLINIC_OR_DEPARTMENT_OTHER): Payer: Medicaid Other

## 2017-01-21 ENCOUNTER — Ambulatory Visit (HOSPITAL_BASED_OUTPATIENT_CLINIC_OR_DEPARTMENT_OTHER): Payer: Self-pay | Admitting: Oncology

## 2017-01-21 ENCOUNTER — Ambulatory Visit: Payer: Medicaid Other

## 2017-01-21 DIAGNOSIS — D649 Anemia, unspecified: Secondary | ICD-10-CM | POA: Diagnosis not present

## 2017-01-21 DIAGNOSIS — G893 Neoplasm related pain (acute) (chronic): Secondary | ICD-10-CM | POA: Diagnosis not present

## 2017-01-21 DIAGNOSIS — C7951 Secondary malignant neoplasm of bone: Secondary | ICD-10-CM

## 2017-01-21 DIAGNOSIS — Z5111 Encounter for antineoplastic chemotherapy: Secondary | ICD-10-CM

## 2017-01-21 DIAGNOSIS — J984 Other disorders of lung: Secondary | ICD-10-CM | POA: Diagnosis not present

## 2017-01-21 DIAGNOSIS — C3411 Malignant neoplasm of upper lobe, right bronchus or lung: Secondary | ICD-10-CM | POA: Diagnosis present

## 2017-01-21 DIAGNOSIS — J189 Pneumonia, unspecified organism: Secondary | ICD-10-CM | POA: Diagnosis not present

## 2017-01-21 DIAGNOSIS — R530 Neoplastic (malignant) related fatigue: Secondary | ICD-10-CM

## 2017-01-21 DIAGNOSIS — D6481 Anemia due to antineoplastic chemotherapy: Secondary | ICD-10-CM

## 2017-01-21 DIAGNOSIS — K5900 Constipation, unspecified: Secondary | ICD-10-CM

## 2017-01-21 HISTORY — DX: Pneumonia, unspecified organism: J18.9

## 2017-01-21 LAB — CBC WITH DIFFERENTIAL/PLATELET
BASO%: 0.2 % (ref 0.0–2.0)
Basophils Absolute: 0 10*3/uL (ref 0.0–0.1)
EOS ABS: 0 10*3/uL (ref 0.0–0.5)
EOS%: 0.1 % (ref 0.0–7.0)
HCT: 25.4 % — ABNORMAL LOW (ref 38.4–49.9)
HGB: 8.1 g/dL — ABNORMAL LOW (ref 13.0–17.1)
LYMPH%: 3.4 % — AB (ref 14.0–49.0)
MCH: 32.4 pg (ref 27.2–33.4)
MCHC: 31.9 g/dL — AB (ref 32.0–36.0)
MCV: 101.5 fL — AB (ref 79.3–98.0)
MONO#: 0.5 10*3/uL (ref 0.1–0.9)
MONO%: 5.8 % (ref 0.0–14.0)
NEUT%: 90.5 % — ABNORMAL HIGH (ref 39.0–75.0)
NEUTROS ABS: 8 10*3/uL — AB (ref 1.5–6.5)
Platelets: 206 10*3/uL (ref 140–400)
RBC: 2.5 10*6/uL — ABNORMAL LOW (ref 4.20–5.82)
RDW: 21.7 % — ABNORMAL HIGH (ref 11.0–14.6)
WBC: 8.8 10*3/uL (ref 4.0–10.3)
lymph#: 0.3 10*3/uL — ABNORMAL LOW (ref 0.9–3.3)

## 2017-01-21 LAB — COMPREHENSIVE METABOLIC PANEL
ALT: 20 U/L (ref 0–55)
AST: 22 U/L (ref 5–34)
Albumin: 2.2 g/dL — ABNORMAL LOW (ref 3.5–5.0)
Alkaline Phosphatase: 129 U/L (ref 40–150)
Anion Gap: 13 mEq/L — ABNORMAL HIGH (ref 3–11)
BUN: 7.8 mg/dL (ref 7.0–26.0)
CHLORIDE: 103 meq/L (ref 98–109)
CO2: 23 meq/L (ref 22–29)
Calcium: 9.2 mg/dL (ref 8.4–10.4)
Creatinine: 0.7 mg/dL (ref 0.7–1.3)
Glucose: 95 mg/dl (ref 70–140)
POTASSIUM: 3.6 meq/L (ref 3.5–5.1)
SODIUM: 139 meq/L (ref 136–145)
TOTAL PROTEIN: 7.2 g/dL (ref 6.4–8.3)
Total Bilirubin: 0.55 mg/dL (ref 0.20–1.20)

## 2017-01-21 LAB — PREPARE RBC (CROSSMATCH)

## 2017-01-21 MED ORDER — SODIUM CHLORIDE 0.9 % IV SOLN
250.0000 mL | Freq: Once | INTRAVENOUS | Status: AC
  Administered 2017-01-21: 250 mL via INTRAVENOUS

## 2017-01-21 MED ORDER — ACETAMINOPHEN 325 MG PO TABS
650.0000 mg | ORAL_TABLET | Freq: Once | ORAL | Status: AC
  Administered 2017-01-21: 650 mg via ORAL
  Filled 2017-01-21: qty 2

## 2017-01-21 MED ORDER — OXYCODONE HCL 5 MG PO TABS: 5.0000 mg | ORAL_TABLET | ORAL | 0 refills | Status: DC | PRN

## 2017-01-21 MED ORDER — DIPHENHYDRAMINE HCL 25 MG PO CAPS
25.0000 mg | ORAL_CAPSULE | Freq: Once | ORAL | Status: AC
  Administered 2017-01-21: 25 mg via ORAL
  Filled 2017-01-21: qty 1

## 2017-01-21 NOTE — Telephone Encounter (Signed)
While I was leaving message that pt had appt today I see that pt arrived .

## 2017-01-21 NOTE — Discharge Instructions (Signed)

## 2017-01-21 NOTE — Progress Notes (Signed)
Provider: Mayme Genta  Diagnosis Association: Symptomatic anemia (D64.9)  Treatment: 2 units of PRBC's via IVPB  Patient received 2 units of PRBC's via IVPB. Patient tolerated procedure well with no transfusion reaction. Discharge instructions given to patient and patient states an understanding. Patient alert, oriented, and ambulatory at time of discharge.

## 2017-01-21 NOTE — Patient Instructions (Signed)
Steps to Quit Smoking Smoking tobacco can be bad for your health. It can also affect almost every organ in your body. Smoking puts you and people around you at risk for many serious long-lasting (chronic) diseases. Quitting smoking is hard, but it is one of the best things that you can do for your health. It is never too late to quit. What are the benefits of quitting smoking? When you quit smoking, you lower your risk for getting serious diseases and conditions. They can include:  Lung cancer or lung disease.  Heart disease.  Stroke.  Heart attack.  Not being able to have children (infertility).  Weak bones (osteoporosis) and broken bones (fractures). If you have coughing, wheezing, and shortness of breath, those symptoms may get better when you quit. You may also get sick less often. If you are pregnant, quitting smoking can help to lower your chances of having a baby of low birth weight. What can I do to help me quit smoking? Talk with your doctor about what can help you quit smoking. Some things you can do (strategies) include:  Quitting smoking totally, instead of slowly cutting back how much you smoke over a period of time.  Going to in-person counseling. You are more likely to quit if you go to many counseling sessions.  Using resources and support systems, such as:  Online chats with a counselor.  Phone quitlines.  Printed self-help materials.  Support groups or group counseling.  Text messaging programs.  Mobile phone apps or applications.  Taking medicines. Some of these medicines may have nicotine in them. If you are pregnant or breastfeeding, do not take any medicines to quit smoking unless your doctor says it is okay. Talk with your doctor about counseling or other things that can help you. Talk with your doctor about using more than one strategy at the same time, such as taking medicines while you are also going to in-person counseling. This can help make quitting  easier. What things can I do to make it easier to quit? Quitting smoking might feel very hard at first, but there is a lot that you can do to make it easier. Take these steps:  Talk to your family and friends. Ask them to support and encourage you.  Call phone quitlines, reach out to support groups, or work with a counselor.  Ask people who smoke to not smoke around you.  Avoid places that make you want (trigger) to smoke, such as:  Bars.  Parties.  Smoke-break areas at work.  Spend time with people who do not smoke.  Lower the stress in your life. Stress can make you want to smoke. Try these things to help your stress:  Getting regular exercise.  Deep-breathing exercises.  Yoga.  Meditating.  Doing a body scan. To do this, close your eyes, focus on one area of your body at a time from head to toe, and notice which parts of your body are tense. Try to relax the muscles in those areas.  Download or buy apps on your mobile phone or tablet that can help you stick to your quit plan. There are many free apps, such as QuitGuide from the CDC (Centers for Disease Control and Prevention). You can find more support from smokefree.gov and other websites. This information is not intended to replace advice given to you by your health care provider. Make sure you discuss any questions you have with your health care provider. Document Released: 07/12/2009 Document Revised: 05/13/2016 Document   Reviewed: 01/30/2015 Elsevier Interactive Patient Education  2017 Elsevier Inc.  

## 2017-01-21 NOTE — Progress Notes (Signed)
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  GUARDANT 360: Negative for EGFR, ALK, ROS 1, BRAF mutations but showed amplification of EGFR and MET  PRIOR THERAPY: 1) Palliative systemic chemotherapy to the right superior mediastinal and right paratracheal lymphadenopathy under the care of Dr. Lisbeth Renshaw. 2) Systemic chemotherapy with carboplatin for AUC of 5, Alimta 500 MG/M2 and Avastin 15 MG/KG every 3 weeks is status post 6 cycles. First cycle was given on 07/28/2016.   CURRENT THERAPY: Maintenance systemic chemotherapy with Alimta 500 MG/M2 and Avastin 15 MG/KG every 3 weeks. First dose 11/19/2016. Status post 3 cycles. Avastin was discontinued after cycle #3 secondary to development of right upper lobe cavitary lesion.  INTERVAL HISTORY: Edward Spence 61 y.o. male returns to the clinic today for follow-up visit accompanied by his girlfriend. The patient is complaining of increasing fatigue and weakness as well as cough productive of yellowish sputum. He denied having any hemoptysis. He also has been complaining of constipation for more than 10 days. He was taking Senokot with no improvement. He did not call for any medical assistant. He continues to have right chest pain and he is currently on MS Contin as well as oxycodone. He will shortness of breath with exertion. The patient denied having any nausea, vomiting or diarrhea. He lost few pounds since his last visit and he has no appetite. He was seen by the dietitian at the [facility] previously and she recommended supplements for him. He has  fever or chills. He denied having any headache or visual changes. He had repeat CT scan of the chest, abdomen and pelvis performed recently and he is here for evaluation and discussion of his scan results.  MEDICAL HISTORY: Past Medical History:  Diagnosis Date  . Constipation 07/23/2016  . Encounter for antineoplastic chemotherapy 06/24/2016  . Lung mass 05/19/2016  . Pneumonia    in college    ALLERGIES:  is allergic to penicillins.  MEDICATIONS:  Current Outpatient Prescriptions  Medication Sig Dispense Refill  . dexamethasone (DECADRON) 4 MG tablet 4 mg by mouth twice a day the day before, day of and day after the chemotherapy every 3 weeks 40 tablet 1  . folic acid (FOLVITE) 1 MG tablet Take 1 tablet (1 mg total) by mouth daily. 30 tablet 4  . morphine (MS CONTIN) 30 MG 12 hr tablet Take 1 tablet (30 mg total) by mouth every 12 (twelve) hours. 60 tablet 0  . ondansetron (ZOFRAN ODT) 8 MG disintegrating tablet Take 1 tablet (8 mg total) by mouth every 8 (eight) hours as needed for nausea or vomiting. 30 tablet 1  . oxyCODONE (OXY IR/ROXICODONE) 5 MG immediate release tablet Take 1-2 tablets (5-10 mg total) by mouth every 4 (four) hours as needed for severe pain. 45 tablet 0  . prochlorperazine (COMPAZINE) 10 MG tablet Take 1 tablet (10 mg total) by mouth every 6 (six) hours as needed for nausea or vomiting. (Patient not taking: Reported on 12/31/2016) 30 tablet 0   No current facility-administered medications for this visit.     SURGICAL HISTORY:  Past Surgical History:  Procedure Laterality Date  . APPENDECTOMY    . VIDEO BRONCHOSCOPY WITH ENDOBRONCHIAL ULTRASOUND N/A 05/22/2016   Procedure: VIDEO BRONCHOSCOPY WITH ENDOBRONCHIAL ULTRASOUND;  Surgeon: [name], MD;  Location: [facility];  Service: Thoracic;  Laterality: N/A;    REVIEW OF SYSTEMS:  Constitutional: positive for anorexia, fatigue and weight loss Eyes: negative Ears, nose, mouth, throat, and face:  negative Respiratory: positive for cough, dyspnea on exertion, pneumonia and sputum Cardiovascular: negative Gastrointestinal: positive for constipation Genitourinary:negative Integument/breast: negative Hematologic/lymphatic: negative Musculoskeletal:positive for muscle weakness Neurological: negative Behavioral/Psych: negative Endocrine: negative Allergic/Immunologic: negative   PHYSICAL EXAMINATION: General appearance: alert, cooperative, fatigued and no distress Head: Normocephalic, without obvious abnormality, atraumatic Neck: no adenopathy, no JVD, supple, symmetrical, trachea midline and thyroid not enlarged, symmetric, no tenderness/mass/nodules Lymph nodes: Cervical, supraclavicular, and axillary nodes normal. Resp: clear to auscultation bilaterally Back: symmetric, no curvature. ROM normal. No CVA tenderness. Cardio: regular rate and rhythm, S1, S2 normal, no murmur, click, rub or gallop GI: soft, non-tender; bowel sounds normal; no masses,  no organomegaly Extremities: extremities normal, atraumatic, no cyanosis or edema Neurologic: Alert and oriented X 3, normal strength and tone. Normal symmetric reflexes. Normal coordination and gait  ECOG PERFORMANCE STATUS: 1 - Symptomatic but completely ambulatory  Blood pressure 98/61, pulse (!) 107, resp. rate 17, height _0  (1.88 m), weight 158 lb 12.8 oz (72 kg), SpO2 100 %.  LABORATORY DATA: Lab Results  Component Value Date   WBC 8.8 01/21/2017   HGB 8.1 (L) 01/21/2017   HCT 25.4 (L) 01/21/2017   MCV 101.5 (H) 01/21/2017   PLT 206 01/21/2017      Chemistry      Component Value Date/Time   NA 141 12/31/2016 0932   K 4.6 12/31/2016 0932   CL 107 05/22/2016 0641   CO2 23 12/31/2016 0932   BUN 12.8 12/31/2016 0932   CREATININE 0.9 12/31/2016 0932      Component Value Date/Time   CALCIUM 10.1 12/31/2016 0932   ALKPHOS 94 12/31/2016 0932   AST 27 12/31/2016 0932   ALT 18 12/31/2016 0932   BILITOT 0.54 12/31/2016  0932       RADIOGRAPHIC STUDIES: Ct Chest W Contrast  Result Date: 01/19/2017 CLINICAL DATA:  Stage IV metastatic non-small cell lung adenocarcinoma diagnosed in August 2017 with RIGHT upper lobe nodule, superior mediastinal mass and bone metastasis. EXAM: CT CHEST, ABDOMEN, AND PELVIS WITH CONTRAST TECHNIQUE: Multidetector CT imaging of the chest, abdomen and pelvis was performed following the standard protocol during bolus administration of intravenous contrast. CONTRAST:  175m ISOVUE-300 IOPAMIDOL (ISOVUE-300) INJECTION 61% COMPARISON:  CT 10/29/2016, PET-CT 05/29/2016 FINDINGS: CT CHEST FINDINGS Cardiovascular: No significant vascular findings. Normal heart size. No pericardial effusion. Mediastinum/Nodes: No axillary or supraclavicular adenopathy. Ill-defined tissue planes in the upper RIGHT mediastinal fat are similar to prior. No discrete lymphadenopathy. There is thickening in the RIGHT lower paratracheal / hilar regions . There is RIGHT suprahilar fullness. Lungs/Pleura: New thick-walled cavitary lesion in the RIGHT upper lobe. The large cavitary lesion measures 5.4 x 5 4.9 cm. There is consolidative airspace / fluid surrounding the cavitary lesion extending in the interstitium and airspace of the RIGHT upper lobe. Treated nodule at the more superior RIGHT upper lobe measuring 9 mm by 8 mm (image 44, series 40 and not changed from prior. RIGHT middle lobe is clear.  The RIGHT lower lobe is clear No lesions within the LEFT upper lobe. Mild bronchial plugging in the LEFT lower lobe (image 120, series  4. Musculoskeletal: No aggressive osseous lesion. CT ABDOMEN AND PELVIS FINDINGS Hepatobiliary:  No focal hepatic lesion. Pancreas: Pancreas is normal. No ductal dilatation. No pancreatic inflammation. Spleen: Normal spleen Adrenals/urinary tract: Adrenal glands and kidneys are normal. The ureters and bladder normal. Stomach/Bowel: Stomach, small bowel, appendix, and cecum are normal. The colon and  rectosigmoid colon are normal. Vascular/Lymphatic: Abdominal aorta is normal caliber with atherosclerotic calcification. There is no retroperitoneal or periportal lymphadenopathy. No pelvic lymphadenopathy. Reproductive: Prostate normal Other: No free fluid. Musculoskeletal: Round sclerotic lesion in the L5 vertebral body measures 15 mm (image 103, series 7). This lesion is stable. Stable lesion in the medial RIGHT inferior iliac bone along the SI joint. IMPRESSION: Chest Impression: 1. New large thick-walled cavitary lesion in the RIGHT upper lobe is suspicious for CAVITARY PNEUMONIA or fungal infection. Lung cancer recurrence or radiation change are less likely. 2. Mild increased soft tissue fullness within the RIGHT hilum likely relates to the above cavitary process. Abdomen / Pelvis Impression: 1. No evidence metastasis in the soft tissue . 2. Stable sclerotic lesion at L5 and the RIGHT iliac bone. These results will be called to the ordering clinician or representative by the Radiologist Assistant, and communication documented in the PACS or zVision Dashboard. Electronically Signed   By: Suzy Bouchard M.D.   On: 01/19/2017 18:28   Ct Abdomen Pelvis W Contrast  Result Date: 01/19/2017 CLINICAL DATA:  Stage IV metastatic non-small cell lung adenocarcinoma diagnosed in August 2017 with RIGHT upper lobe nodule, superior mediastinal mass and bone metastasis. EXAM: CT CHEST, ABDOMEN, AND PELVIS WITH CONTRAST TECHNIQUE: Multidetector CT imaging of the chest, abdomen and pelvis was performed following the standard protocol during bolus administration of intravenous contrast. CONTRAST:  148m ISOVUE-300 IOPAMIDOL (ISOVUE-300) INJECTION 61% COMPARISON:  CT 10/29/2016, PET-CT 05/29/2016 FINDINGS: CT CHEST FINDINGS Cardiovascular: No significant vascular findings. Normal heart size. No pericardial effusion. Mediastinum/Nodes: No axillary or supraclavicular adenopathy. Ill-defined tissue planes in the upper RIGHT  mediastinal fat are similar to prior. No discrete lymphadenopathy. There is thickening in the RIGHT lower paratracheal / hilar regions . There is RIGHT suprahilar fullness. Lungs/Pleura: New thick-walled cavitary lesion in the RIGHT upper lobe. The large cavitary lesion measures 5.4 x 5 4.9 cm. There is consolidative airspace / fluid surrounding the cavitary lesion extending in the interstitium and airspace of the RIGHT upper lobe. Treated nodule at the more superior RIGHT upper lobe measuring 9 mm by 8 mm (image 44, series 40 and not changed from prior. RIGHT middle lobe is clear.  The RIGHT lower lobe is clear No lesions within the LEFT upper lobe. Mild bronchial plugging in the LEFT lower lobe (image 120, series 4. Musculoskeletal: No aggressive osseous lesion. CT ABDOMEN AND PELVIS FINDINGS Hepatobiliary:  No focal hepatic lesion. Pancreas: Pancreas is normal. No ductal dilatation. No pancreatic inflammation. Spleen: Normal spleen Adrenals/urinary tract: Adrenal glands and kidneys are normal. The ureters and bladder normal. Stomach/Bowel: Stomach, small bowel, appendix, and cecum are normal. The colon and rectosigmoid colon are normal. Vascular/Lymphatic: Abdominal aorta is normal caliber with atherosclerotic calcification. There is no retroperitoneal or periportal lymphadenopathy. No pelvic lymphadenopathy. Reproductive: Prostate normal Other: No free fluid. Musculoskeletal: Round sclerotic lesion in the L5 vertebral body measures 15 mm (image 103, series 7). This lesion is stable. Stable lesion in the medial RIGHT inferior iliac bone along the SI joint. IMPRESSION: Chest Impression: 1. New large thick-walled cavitary lesion in the RIGHT upper lobe is suspicious for CAVITARY PNEUMONIA or fungal infection. Lung  cancer recurrence or radiation change are less likely. 2. Mild increased soft tissue fullness within the RIGHT hilum likely relates to the above cavitary process. Abdomen / Pelvis Impression: 1. No  evidence metastasis in the soft tissue . 2. Stable sclerotic lesion at L5 and the RIGHT iliac bone. These results will be called to the ordering clinician or representative by the Radiologist Assistant, and communication documented in the PACS or zVision Dashboard. Electronically Signed   By: Suzy Bouchard M.D.   On: 01/19/2017 18:28    ASSESSMENT AND PLAN:  This is a very pleasant 61 years old white male with multiple medical problems including: 1) stage IV non-small cell lung cancer, adenocarcinoma status post 6 cycles of induction systemic chemotherapy was carboplatin, Alimta and Avastin with partial response. He is currently on maintenance treatment with Alimta and Avastin status post 3 cycles. Has been tolerating the treatment well except for significant fatigue and weakness. He had repeat CT scan of the chest, abdomen and pelvis performed recently. I personally and independently reviewed the scan images and discuss the results with the patient and his girlfriend. The scan showed stable disease but unfortunately there is development of large cavitary lesion in the right upper lobe suspicious for cavitary pneumonia or fungal infection. This could be also a result of the previous treatment with the Avastin which can cause central necrosis in tumors. I recommended for the patient to discontinue his current treatment with Avastin at this point. I will delay his treatment with Alimta for 3 weeks until improvement of the cavitary lesion. 2) new cavitary lung lesion suspicious for cavitary pneumonia or fungal infection versus sequela of treatment with Avastin. I would discontinue the treatment with Avastin at this point. I will start the patient empirically on doxycycline 100 mg by mouth twice a day for 2 weeks. I will also refer the patient to a pulmonologist for evaluation of his condition and other recommendation regarding the cavitary lesion. 3) for the prolonged constipation, the patient will use  magnesium citrate and if no improvement may consider a rectal enema. He will call us if there is no improvement in his condition. 4) chemotherapy-induced anemia, I will arrange for the patient to receive 2 units of PRBCs transfusion today. 5) pain management: The patient will continue on MS Contin and oxycodone. I gave him refill of oxycodone. I will see the patient back for follow-up visit in 3 weeks for reevaluation. He was advised to call immediately if he has any concerning symptoms in the interval. The patient voices understanding of current disease status and treatment options and is in agreement with the current care plan. All questions were answered. The patient knows to call the clinic with any problems, questions or concerns. We can certainly see the patient much sooner if necessary.  Disclaimer: This note was dictated with voice recognition software. Similar sounding words can inadvertently be transcribed and may not be corrected upon review.

## 2017-01-22 ENCOUNTER — Other Ambulatory Visit: Payer: Self-pay | Admitting: *Deleted

## 2017-01-22 LAB — TYPE AND SCREEN
ABO/RH(D): A POS
Antibody Screen: NEGATIVE
UNIT DIVISION: 0
UNIT DIVISION: 0

## 2017-01-22 LAB — BPAM RBC
BLOOD PRODUCT EXPIRATION DATE: 201805102359
BLOOD PRODUCT EXPIRATION DATE: 201805102359
ISSUE DATE / TIME: 201804251147
ISSUE DATE / TIME: 201804251147
UNIT TYPE AND RH: 6200
Unit Type and Rh: 6200

## 2017-01-22 MED ORDER — DOXYCYCLINE HYCLATE 100 MG PO TABS: ORAL_TABLET | ORAL | 0 refills | Status: DC

## 2017-01-22 NOTE — Telephone Encounter (Signed)
Message received from patient's wife stating that prescription for antibiotic was to be sent in yesterday per order of Dr. Julien Nordmann.  Per Dr. Worthy Flank office note from 01/21/17 pt is to receive Doxycycline 100 mg by mouth twice a day for two weeks.  Prescription sent to [facility] in [place] per pt.'s request.  Call placed back to patient to inform him that prescription has been sent in.  Patient appreciative of call back.

## 2017-01-23 ENCOUNTER — Telehealth: Payer: Self-pay | Admitting: Internal Medicine

## 2017-01-23 NOTE — Telephone Encounter (Signed)
Scheduled appts per 4/25 los - patient to pick up new schedule next visit.

## 2017-01-27 ENCOUNTER — Telehealth: Payer: Self-pay

## 2017-01-27 NOTE — Telephone Encounter (Signed)
Returned call to pt. Pt inquired why he needs the injection. Discussed xgeva, s/s, and possible risks without this injection. Pt verbalized understanding and confirmed tomorrows appt.  no further concerns.

## 2017-01-27 NOTE — Telephone Encounter (Signed)
Family called asking if pt needs to keep injection appt 5/2 and asking what the injection is for. This because Dr Julien Nordmann held chemo as of 4/15 and put in a pulmonary consult.  The injection is xgeva mentioned in 3/14 OV note.

## 2017-01-28 ENCOUNTER — Ambulatory Visit (HOSPITAL_BASED_OUTPATIENT_CLINIC_OR_DEPARTMENT_OTHER): Payer: Medicaid Other

## 2017-01-28 VITALS — BP 104/70 | HR 88 | Resp 18

## 2017-01-28 DIAGNOSIS — C3411 Malignant neoplasm of upper lobe, right bronchus or lung: Secondary | ICD-10-CM

## 2017-01-28 DIAGNOSIS — D702 Other drug-induced agranulocytosis: Secondary | ICD-10-CM

## 2017-01-28 DIAGNOSIS — C7951 Secondary malignant neoplasm of bone: Secondary | ICD-10-CM | POA: Diagnosis not present

## 2017-01-28 MED ORDER — DENOSUMAB 120 MG/1.7ML ~~LOC~~ SOLN
120.0000 mg | Freq: Once | SUBCUTANEOUS | Status: AC
  Administered 2017-01-28: 120 mg via SUBCUTANEOUS
  Filled 2017-01-28: qty 1.7

## 2017-01-28 NOTE — Patient Instructions (Signed)

## 2017-01-29 ENCOUNTER — Encounter: Payer: Self-pay | Admitting: Pulmonary Disease

## 2017-01-29 ENCOUNTER — Ambulatory Visit (INDEPENDENT_AMBULATORY_CARE_PROVIDER_SITE_OTHER): Payer: Medicaid Other | Admitting: Pulmonary Disease

## 2017-01-29 ENCOUNTER — Other Ambulatory Visit (INDEPENDENT_AMBULATORY_CARE_PROVIDER_SITE_OTHER): Payer: Medicaid Other

## 2017-01-29 VITALS — BP 102/70 | HR 99 | Ht 74.0 in | Wt 157.0 lb

## 2017-01-29 DIAGNOSIS — J189 Pneumonia, unspecified organism: Secondary | ICD-10-CM | POA: Diagnosis not present

## 2017-01-29 DIAGNOSIS — J984 Other disorders of lung: Secondary | ICD-10-CM

## 2017-01-29 LAB — CBC WITH DIFFERENTIAL/PLATELET
BASOS ABS: 0.1 10*3/uL (ref 0.0–0.1)
Basophils Relative: 0.8 % (ref 0.0–3.0)
EOS ABS: 0 10*3/uL (ref 0.0–0.7)
EOS PCT: 0.2 % (ref 0.0–5.0)
HCT: 31.1 % — ABNORMAL LOW (ref 39.0–52.0)
HEMOGLOBIN: 9.9 g/dL — AB (ref 13.0–17.0)
Lymphocytes Relative: 7 % — ABNORMAL LOW (ref 12.0–46.0)
Lymphs Abs: 0.6 10*3/uL — ABNORMAL LOW (ref 0.7–4.0)
MCHC: 31.7 g/dL (ref 30.0–36.0)
MCV: 98.9 fl (ref 78.0–100.0)
MONO ABS: 0.8 10*3/uL (ref 0.1–1.0)
Monocytes Relative: 10.3 % (ref 3.0–12.0)
Neutro Abs: 6.7 10*3/uL (ref 1.4–7.7)
Neutrophils Relative %: 81.7 % — ABNORMAL HIGH (ref 43.0–77.0)
Platelets: 307 10*3/uL (ref 150.0–400.0)
RBC: 3.15 Mil/uL — AB (ref 4.22–5.81)
RDW: 21.5 % — ABNORMAL HIGH (ref 11.5–15.5)
WBC: 8.2 10*3/uL (ref 4.0–10.5)

## 2017-01-29 NOTE — Patient Instructions (Signed)
Check sputum for AFB, fungal, bacterial cultures Check QuantiFERON, fungitell beta D glucan Finish your doxycycline  For the constipation continue the senna. Start Fleet enemas at home Start MiraLAX twice daily. We'll schedule for a follow-up x-ray and a clinic visit in 2 weeks for reassessment.

## 2017-01-29 NOTE — Progress Notes (Signed)
Edward Spence    [id number]    06/05/1956  Primary Care Physician:House, [name], FNP  Referring Physician: Curt Bears, MD [address] [place], [place] [address]  Chief complaint:  Consult for evaluation of dyspnea, cavitary lung lesion  HPI: History of  Stage IV (T3, N3, M1b) non-small cell lung cancer, adenocarcinoma diagnosed in August 2017 when he presented with right upper lobe lung nodule, mediastinal lymphadenopathy, bone metastasis. He is currently on chemotherapy with altima and avastin,  radiation with partial response. Avastin was discontinued after development of right upper lobe cavitary lesion seen on a follow-up CT on 4/23.  Patient reports no fevers or chills. He has chronic cough with white sputum production mostly in the morning. Denies any dyspnea, fevers, hemoptysis. His chief complaint is severe constipation and pain in the right shoulder and chest when he moves his arm.  Outpatient Encounter Prescriptions as of 01/29/2017  Medication Sig  . dexamethasone (DECADRON) 4 MG tablet 4 mg by mouth twice a day the day before, day of and day after the chemotherapy every 3 weeks  . doxycycline (VIBRA-TABS) 100 MG tablet Take one tablet twice a day for two weeks  . folic acid (FOLVITE) 1 MG tablet Take 1 tablet (1 mg total) by mouth daily.  Marland Kitchen morphine (MS CONTIN) 30 MG 12 hr tablet Take 1 tablet (30 mg total) by mouth every 12 (twelve) hours.  . ondansetron (ZOFRAN ODT) 8 MG disintegrating tablet Take 1 tablet (8 mg total) by mouth every 8 (eight) hours as needed for nausea or vomiting.  Marland Kitchen oxyCODONE (OXY IR/ROXICODONE) 5 MG immediate release tablet Take 1-2 tablets (5-10 mg total) by mouth every 4 (four) hours as needed for severe pain.  Marland Kitchen prochlorperazine (COMPAZINE) 10 MG tablet Take 1 tablet (10 mg total) by mouth every 6 (six) hours as needed for nausea or vomiting.  . sennosides-docusate sodium (SENOKOT-S) 8.6-50 MG tablet Take 1 tablet by mouth 2  (two) times daily as needed for constipation.   No facility-administered encounter medications on file as of 01/29/2017.     Allergies as of 01/29/2017 - Review Complete 01/29/2017  Allergen Reaction Noted  . Penicillins Hives 05/19/2016    Past Medical History:  Diagnosis Date  . Cavitary pneumonia 01/21/2017  . Constipation 07/23/2016  . Encounter for antineoplastic chemotherapy 06/24/2016  . Lung cancer ([facility])   . Lung mass 05/19/2016  . Pneumonia    in college    Past Surgical History:  Procedure Laterality Date  . APPENDECTOMY    . VIDEO BRONCHOSCOPY WITH ENDOBRONCHIAL ULTRASOUND N/A 05/22/2016   Procedure: VIDEO BRONCHOSCOPY WITH ENDOBRONCHIAL ULTRASOUND;  Surgeon: [name], MD;  Location: [facility] OR;  Service: Thoracic;  Laterality: N/A;    Family History  Problem Relation Age of Onset  . Cancer Mother   . COPD Father   . Heart failure Father     Social History   Social History  . Marital status: Divorced    Spouse name: N/A  . Number of children: N/A  . Years of education: N/A   Occupational History  . Not on file.   Social History Main Topics  . Smoking status: Current Every Day Smoker    Packs/day: 0.25    Years: 40.00    Types: Cigarettes    Last attempt to quit: 04/29/2016  . Smokeless tobacco: Never Used     Comment: 1-5 per day  . Alcohol use No  .  Drug use: No  . Sexual activity: Not on file   Other Topics Concern  . Not on file   Social History Narrative  . No narrative on file    Review of systems: Review of Systems  Constitutional: Negative for fever and chills.  HENT: Negative.   Eyes: Negative for blurred vision.  Respiratory: as per HPI  Cardiovascular: Negative for chest pain and palpitations.  Gastrointestinal: Negative for vomiting, diarrhea, blood per rectum. Genitourinary: Negative for dysuria, urgency, frequency and hematuria.  Musculoskeletal: Negative for myalgias, back pain and joint pain.  Skin: Negative for  itching and rash.  Neurological: Negative for dizziness, tremors, focal weakness, seizures and loss of consciousness.  Endo/Heme/Allergies: Negative for environmental allergies.  Psychiatric/Behavioral: Negative for depression, suicidal ideas and hallucinations.  All other systems reviewed and are negative.  Physical Exam: Blood pressure 102/70, pulse 99, height '6\' 2"'$  (1.88 m), weight 157 lb (71.2 kg), SpO2 99 %. Gen:      No acute distress HEENT:  EOMI, sclera anicteric Neck:     No masses; no thyromegaly Lungs:    Clear to auscultation bilaterally; normal respiratory effort CV:         Regular rate and rhythm; no murmurs Abd:      + bowel sounds; soft, non-tender; no palpable masses, no distension Ext:    No edema; adequate peripheral perfusion Skin:      Warm and dry; no rash Neuro: alert and oriented x 3 Psych: normal mood and affect  Data Reviewed:  CT 05/14/16-right upper lobe mediastinal mass measuring 5.4 x 7.3 x 5.9 cm., Spiculated right upper lobe nodule showing 14 x 12 mm. Right hilar mediastinal lymph nodes CT scan 09/01/16- improvement in mediastinal mass, right upper lobe lung nodule and lymphadenopathy CT chest 10/29/16- continued improvement in mediastinal mass, lymphadenopathy and right upper lobe nodule. CT 01/19/17-  interval development of large right upper lobe cavitary lesion, fullness in the right hilum. Right upper lobe nodule no change from prior  I had reviewed all images personally.  Assessment:  Evaluation for right upper lobe cavitary lesion Stage IV lung adenocarcinoma  Location and timing of the lesion is consistent with the infectious process either bacterial, fungal or mycobacterial. It has appeared in the area of the lung that was not involved by lung cancer. He is currently on doxycycline therapy day 5/14. We will assess with sputum for AFB, fungal, bacterial cultures. Check blood for CBC, QuantiFERON, beta D glucan, galactomannan. He'll need short-term  follow-up with repeat chest x-ray in about 2 weeks after completion of antibiotic course. If lesion is persistent and the above tests are negative we'll proceed with a bronchoscopy.  Constipation, on narcotic pain meds On senna, Recommended he try OTC fleet enemas and miralax.   Plan/Recommendations: - Finish doxy course - Check sputum cx - Quantiferon, beta d glucan, galactomannan, CBC with diff - Follow up up in 2 weeks with xray.   Marshell Garfinkel MD [facility] Pager [phone] 01/29/2017, 11:03 AM  CC: Curt Bears, MD

## 2017-02-02 ENCOUNTER — Other Ambulatory Visit: Payer: Medicaid Other

## 2017-02-02 DIAGNOSIS — J984 Other disorders of lung: Principal | ICD-10-CM

## 2017-02-02 DIAGNOSIS — J189 Pneumonia, unspecified organism: Secondary | ICD-10-CM

## 2017-02-02 LAB — QUANTIFERON TB GOLD ASSAY (BLOOD)
INTERFERON GAMMA RELEASE ASSAY: NEGATIVE
Mitogen-Nil: 1.44 IU/mL
Quantiferon Nil Value: 0.28 IU/mL

## 2017-02-03 LAB — RESPIRATORY ALLERGY PROFILE REGION II ~~LOC~~
Allergen, A. alternata, m6: <0.1 kU/L
Allergen, C. Herbarum, M2: <0.1 kU/L
Allergen, D pternoyssinus,d7: <0.1 kU/L
Allergen, Oak,t7: <0.1 kU/L
Allergen, P. notatum, m1: <0.1 kU/L
Aspergillus fumigatus, m3: <0.1 kU/L
Box Elder IgE: <0.1 kU/L
Cat Dander: <0.1 kU/L
Cockroach: <0.1 kU/L
Common Ragweed: <0.1 kU/L
D. farinae: <0.1 kU/L
IGE (IMMUNOGLOBULIN E), SERUM: 31 kU/L (ref <115)
Johnson Grass: <0.1 kU/L
Pecan/Hickory Tree IgE: <0.1 kU/L
Rough Pigweed  IgE: <0.1 kU/L
Sheep Sorrel IgE: <0.1 kU/L
Timothy Grass: <0.1 kU/L

## 2017-02-05 LAB — ASPERGILLUS ANTIGEN,SERUM
Aspergillus Ag, EIA: NOT DETECTED
Index Value: 0.18 (ref <0.50)

## 2017-02-05 LAB — RESPIRATORY CULTURE OR RESPIRATORY AND SPUTUM CULTURE: Organism ID, Bacteria: NORMAL

## 2017-02-06 LAB — FUNGITELL (1-3)-B-D-GLUCAN
(1-3)-B-D-GLUCAN: 149 pg/mL — AB
Interpretation: POSITIVE — AB

## 2017-02-11 ENCOUNTER — Ambulatory Visit: Payer: Medicaid Other

## 2017-02-11 ENCOUNTER — Ambulatory Visit (HOSPITAL_BASED_OUTPATIENT_CLINIC_OR_DEPARTMENT_OTHER): Payer: Medicaid Other | Admitting: Internal Medicine

## 2017-02-11 ENCOUNTER — Encounter: Payer: Self-pay | Admitting: Internal Medicine

## 2017-02-11 ENCOUNTER — Other Ambulatory Visit (HOSPITAL_BASED_OUTPATIENT_CLINIC_OR_DEPARTMENT_OTHER): Payer: Medicaid Other

## 2017-02-11 ENCOUNTER — Telehealth: Payer: Self-pay | Admitting: Internal Medicine

## 2017-02-11 DIAGNOSIS — G893 Neoplasm related pain (acute) (chronic): Secondary | ICD-10-CM | POA: Diagnosis not present

## 2017-02-11 DIAGNOSIS — C7951 Secondary malignant neoplasm of bone: Secondary | ICD-10-CM

## 2017-02-11 DIAGNOSIS — C3411 Malignant neoplasm of upper lobe, right bronchus or lung: Secondary | ICD-10-CM

## 2017-02-11 DIAGNOSIS — J189 Pneumonia, unspecified organism: Secondary | ICD-10-CM

## 2017-02-11 DIAGNOSIS — J984 Other disorders of lung: Secondary | ICD-10-CM | POA: Diagnosis not present

## 2017-02-11 DIAGNOSIS — Z5111 Encounter for antineoplastic chemotherapy: Secondary | ICD-10-CM

## 2017-02-11 LAB — CBC WITH DIFFERENTIAL/PLATELET
BASO%: 0.2 % (ref 0.0–2.0)
Basophils Absolute: 0 10*3/uL (ref 0.0–0.1)
EOS%: 0.2 % (ref 0.0–7.0)
Eosinophils Absolute: 0 10*3/uL (ref 0.0–0.5)
HEMATOCRIT: 33.3 % — AB (ref 38.4–49.9)
HEMOGLOBIN: 9.7 g/dL — AB (ref 13.0–17.1)
LYMPH#: 0.7 10*3/uL — AB (ref 0.9–3.3)
LYMPH%: 11 % — ABNORMAL LOW (ref 14.0–49.0)
MCH: 29.8 pg (ref 27.2–33.4)
MCHC: 29.1 g/dL — ABNORMAL LOW (ref 32.0–36.0)
MCV: 102.1 fL — ABNORMAL HIGH (ref 79.3–98.0)
MONO#: 0.8 10*3/uL (ref 0.1–0.9)
MONO%: 11.8 % (ref 0.0–14.0)
NEUT%: 76.8 % — AB (ref 39.0–75.0)
NEUTROS ABS: 5 10*3/uL (ref 1.5–6.5)
PLATELETS: 307 10*3/uL (ref 140–400)
RBC: 3.26 10*6/uL — ABNORMAL LOW (ref 4.20–5.82)
RDW: 19.4 % — AB (ref 11.0–14.6)
WBC: 6.6 10*3/uL (ref 4.0–10.3)

## 2017-02-11 LAB — COMPREHENSIVE METABOLIC PANEL
ALT: 13 U/L (ref 0–55)
AST: 20 U/L (ref 5–34)
Albumin: 2.7 g/dL — ABNORMAL LOW (ref 3.5–5.0)
Alkaline Phosphatase: 104 U/L (ref 40–150)
Anion Gap: 11 mEq/L (ref 3–11)
BUN: 9.5 mg/dL (ref 7.0–26.0)
CALCIUM: 9.3 mg/dL (ref 8.4–10.4)
CHLORIDE: 103 meq/L (ref 98–109)
CO2: 24 mEq/L (ref 22–29)
CREATININE: 0.7 mg/dL (ref 0.7–1.3)
EGFR: >90 mL/min/{1.73_m2} (ref >90)
Glucose: 84 mg/dl (ref 70–140)
Potassium: 4.4 mEq/L (ref 3.5–5.1)
Sodium: 138 mEq/L (ref 136–145)
Total Bilirubin: 0.61 mg/dL (ref 0.20–1.20)
Total Protein: 8.1 g/dL (ref 6.4–8.3)

## 2017-02-11 LAB — UA PROTEIN, DIPSTICK - CHCC: PROTEIN: NEGATIVE mg/dL

## 2017-02-11 MED ORDER — MORPHINE SULFATE ER 30 MG PO TBCR: 30.0000 mg | EXTENDED_RELEASE_TABLET | Freq: Three times a day (TID) | ORAL | 0 refills | Status: DC

## 2017-02-11 MED ORDER — OXYCODONE HCL 5 MG PO TABS: 5.0000 mg | ORAL_TABLET | ORAL | 0 refills | Status: DC | PRN

## 2017-02-11 NOTE — Progress Notes (Signed)
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  GUARDANT 360: Negative for EGFR, ALK, ROS 1, BRAF mutations but showed amplification of EGFR and MET  PRIOR THERAPY: 1) Palliative systemic chemotherapy to the right superior mediastinal and right paratracheal lymphadenopathy under the care of Dr. Lisbeth Renshaw. 2) Systemic chemotherapy with carboplatin for AUC of 5, Alimta 500 MG/M2 and Avastin 15 MG/KG every 3 weeks is status post 6 cycles. First cycle was given on 07/28/2016.   CURRENT THERAPY: Maintenance systemic chemotherapy with Alimta 500 MG/M2 and Avastin 15 MG/KG every 3 weeks. First dose 11/19/2016. Status post 3 cycles. Avastin was discontinued after cycle #3 secondary to development of right upper lobe cavitary lesion.  INTERVAL HISTORY: Edward Spence 61 y.o. male returns to the clinic today for follow-up visit. The patient is feeling much better but continues to have pain on the right side of the chest. His pain increased after he discontinued the treatment with doxycycline. He is using his pain medication more frequently just days. He denied having any shortness breath except with exertion. He was seen by Dr. Concepcion Living from pulmonary medicine who recommended for him to continue on the doxycycline and he has another follow-up appointment with him tomorrow. He denied having any recent weight loss or night sweats. He has no nausea, vomiting, diarrhea or constipation. He has no fever or chills. He denied having any bleeding issues. He is here today for reevaluation before resuming his  treatment.  MEDICAL HISTORY: Past Medical History:  Diagnosis Date  . Cavitary pneumonia 01/21/2017  . Constipation 07/23/2016  . Encounter for antineoplastic chemotherapy 06/24/2016  . Lung cancer ([facility])   . Lung mass 05/19/2016  . Pneumonia    in college    ALLERGIES:  is allergic to penicillins.  MEDICATIONS:  Current Outpatient Prescriptions  Medication Sig Dispense Refill  . dexamethasone (DECADRON) 4 MG tablet 4 mg by mouth twice a day the day before, day of and day after the chemotherapy every 3 weeks 40 tablet 1  . folic acid (FOLVITE) 1 MG tablet Take 1 tablet (1 mg total) by mouth daily. 30 tablet 4  . morphine (MS CONTIN) 30 MG 12 hr tablet Take 1 tablet (30 mg total) by mouth every 12 (twelve) hours. 60 tablet 0  . ondansetron (ZOFRAN ODT) 8 MG disintegrating tablet Take 1 tablet (8 mg total) by mouth every 8 (eight) hours as needed for nausea or vomiting. 30 tablet 1  . oxyCODONE (OXY IR/ROXICODONE) 5 MG immediate release tablet Take 1-2 tablets (5-10 mg total) by mouth every 4 (four) hours as needed for severe pain. 45 tablet 0  . prochlorperazine (COMPAZINE) 10 MG tablet Take 1 tablet (10 mg total) by mouth every 6 (six) hours as needed for nausea or vomiting. 30 tablet 0  . sennosides-docusate sodium (SENOKOT-S) 8.6-50 MG tablet Take 1 tablet by mouth 2 (two) times daily as needed for constipation.     No current facility-administered medications for this visit.     SURGICAL HISTORY:  Past Surgical History:  Procedure Laterality Date  . APPENDECTOMY    . VIDEO BRONCHOSCOPY WITH  ENDOBRONCHIAL ULTRASOUND N/A 05/22/2016   Procedure: VIDEO BRONCHOSCOPY WITH ENDOBRONCHIAL ULTRASOUND;  Surgeon: [name], MD;  Location: [facility];  Service: Thoracic;  Laterality: N/A;    REVIEW OF SYSTEMS:  Constitutional: positive for fatigue Eyes: negative Ears, nose, mouth, throat, and face: negative Respiratory: positive for cough and dyspnea on exertion Cardiovascular:  negative Gastrointestinal: positive for constipation Genitourinary:negative Integument/breast: negative Hematologic/lymphatic: negative Musculoskeletal:positive for muscle weakness Neurological: negative Behavioral/Psych: negative Endocrine: negative Allergic/Immunologic: negative   PHYSICAL EXAMINATION: General appearance: alert, cooperative, fatigued and no distress Head: Normocephalic, without obvious abnormality, atraumatic Neck: no adenopathy, no JVD, supple, symmetrical, trachea midline and thyroid not enlarged, symmetric, no tenderness/mass/nodules Lymph nodes: Cervical, supraclavicular, and axillary nodes normal. Resp: clear to auscultation bilaterally Back: symmetric, no curvature. ROM normal. No CVA tenderness. Cardio: regular rate and rhythm, S1, S2 normal, no murmur, click, rub or gallop GI: soft, non-tender; bowel sounds normal; no masses,  no organomegaly Extremities: extremities normal, atraumatic, no cyanosis or edema Neurologic: Alert and oriented X 3, normal strength and tone. Normal symmetric reflexes. Normal coordination and gait  ECOG PERFORMANCE STATUS: 1 - Symptomatic but completely ambulatory  Blood pressure 127/67, pulse (!) 105, temperature 98.9 F (37.2 C), temperature source Oral, resp. rate 20, height 6' 2"  (1.88 m), weight 157 lb 8 oz (71.4 kg), SpO2 99 %.  LABORATORY DATA: Lab Results  Component Value Date   WBC 6.6 02/11/2017   HGB 9.7 (L) 02/11/2017   HCT 33.3 (L) 02/11/2017   MCV 102.1 (H) 02/11/2017   PLT 307 02/11/2017      Chemistry      Component Value Date/Time   NA 139 01/21/2017 0951   K 3.6 01/21/2017 0951   CL 107 05/22/2016 0641   CO2 23 01/21/2017 0951   BUN 7.8 01/21/2017 0951   CREATININE 0.7 01/21/2017 0951      Component Value Date/Time   CALCIUM 9.2 01/21/2017 0951   ALKPHOS 129 01/21/2017 0951   AST 22 01/21/2017 0951   ALT 20 01/21/2017 0951   BILITOT 0.55 01/21/2017 0951       RADIOGRAPHIC STUDIES: Ct Chest  W Contrast  Result Date: 01/19/2017 CLINICAL DATA:  Stage IV metastatic non-small cell lung adenocarcinoma diagnosed in August 2017 with RIGHT upper lobe nodule, superior mediastinal mass and bone metastasis. EXAM: CT CHEST, ABDOMEN, AND PELVIS WITH CONTRAST TECHNIQUE: Multidetector CT imaging of the chest, abdomen and pelvis was performed following the standard protocol during bolus administration of intravenous contrast. CONTRAST:  160m ISOVUE-300 IOPAMIDOL (ISOVUE-300) INJECTION 61% COMPARISON:  CT 10/29/2016, PET-CT 05/29/2016 FINDINGS: CT CHEST FINDINGS Cardiovascular: No significant vascular findings. Normal heart size. No pericardial effusion. Mediastinum/Nodes: No axillary or supraclavicular adenopathy. Ill-defined tissue planes in the upper RIGHT mediastinal fat are similar to prior. No discrete lymphadenopathy. There is thickening in the RIGHT lower paratracheal / hilar regions . There is RIGHT suprahilar fullness. Lungs/Pleura: New thick-walled cavitary lesion in the RIGHT upper lobe. The large cavitary lesion measures 5.4 x 5 4.9 cm. There is consolidative airspace / fluid surrounding the cavitary lesion extending in the interstitium and airspace of the RIGHT upper lobe. Treated nodule at the more superior RIGHT upper lobe measuring 9 mm by 8 mm (image 44, series 40 and not changed from prior. RIGHT middle lobe is clear.  The RIGHT lower lobe is clear No lesions within the LEFT upper lobe. Mild bronchial plugging in the LEFT lower lobe (image 120, series 4. Musculoskeletal: No aggressive osseous lesion. CT ABDOMEN AND PELVIS FINDINGS  Hepatobiliary:  No focal hepatic lesion. Pancreas: Pancreas is normal. No ductal dilatation. No pancreatic inflammation. Spleen: Normal spleen Adrenals/urinary tract: Adrenal glands and kidneys are normal. The ureters and bladder normal. Stomach/Bowel: Stomach, small bowel, appendix, and cecum are normal. The colon and rectosigmoid colon are normal. Vascular/Lymphatic:  Abdominal aorta is normal caliber with atherosclerotic calcification. There is no retroperitoneal or periportal lymphadenopathy. No pelvic lymphadenopathy. Reproductive: Prostate normal Other: No free fluid. Musculoskeletal: Round sclerotic lesion in the L5 vertebral body measures 15 mm (image 103, series 7). This lesion is stable. Stable lesion in the medial RIGHT inferior iliac bone along the SI joint. IMPRESSION: Chest Impression: 1. New large thick-walled cavitary lesion in the RIGHT upper lobe is suspicious for CAVITARY PNEUMONIA or fungal infection. Lung cancer recurrence or radiation change are less likely. 2. Mild increased soft tissue fullness within the RIGHT hilum likely relates to the above cavitary process. Abdomen / Pelvis Impression: 1. No evidence metastasis in the soft tissue . 2. Stable sclerotic lesion at L5 and the RIGHT iliac bone. These results will be called to the ordering clinician or representative by the Radiologist Assistant, and communication documented in the PACS or zVision Dashboard. Electronically Signed   By: Suzy Bouchard M.D.   On: 01/19/2017 18:28   Ct Abdomen Pelvis W Contrast  Result Date: 01/19/2017 CLINICAL DATA:  Stage IV metastatic non-small cell lung adenocarcinoma diagnosed in August 2017 with RIGHT upper lobe nodule, superior mediastinal mass and bone metastasis. EXAM: CT CHEST, ABDOMEN, AND PELVIS WITH CONTRAST TECHNIQUE: Multidetector CT imaging of the chest, abdomen and pelvis was performed following the standard protocol during bolus administration of intravenous contrast. CONTRAST:  159m ISOVUE-300 IOPAMIDOL (ISOVUE-300) INJECTION 61% COMPARISON:  CT 10/29/2016, PET-CT 05/29/2016 FINDINGS: CT CHEST FINDINGS Cardiovascular: No significant vascular findings. Normal heart size. No pericardial effusion. Mediastinum/Nodes: No axillary or supraclavicular adenopathy. Ill-defined tissue planes in the upper RIGHT mediastinal fat are similar to prior. No discrete  lymphadenopathy. There is thickening in the RIGHT lower paratracheal / hilar regions . There is RIGHT suprahilar fullness. Lungs/Pleura: New thick-walled cavitary lesion in the RIGHT upper lobe. The large cavitary lesion measures 5.4 x 5 4.9 cm. There is consolidative airspace / fluid surrounding the cavitary lesion extending in the interstitium and airspace of the RIGHT upper lobe. Treated nodule at the more superior RIGHT upper lobe measuring 9 mm by 8 mm (image 44, series 40 and not changed from prior. RIGHT middle lobe is clear.  The RIGHT lower lobe is clear No lesions within the LEFT upper lobe. Mild bronchial plugging in the LEFT lower lobe (image 120, series 4. Musculoskeletal: No aggressive osseous lesion. CT ABDOMEN AND PELVIS FINDINGS Hepatobiliary:  No focal hepatic lesion. Pancreas: Pancreas is normal. No ductal dilatation. No pancreatic inflammation. Spleen: Normal spleen Adrenals/urinary tract: Adrenal glands and kidneys are normal. The ureters and bladder normal. Stomach/Bowel: Stomach, small bowel, appendix, and cecum are normal. The colon and rectosigmoid colon are normal. Vascular/Lymphatic: Abdominal aorta is normal caliber with atherosclerotic calcification. There is no retroperitoneal or periportal lymphadenopathy. No pelvic lymphadenopathy. Reproductive: Prostate normal Other: No free fluid. Musculoskeletal: Round sclerotic lesion in the L5 vertebral body measures 15 mm (image 103, series 7). This lesion is stable. Stable lesion in the medial RIGHT inferior iliac bone along the SI joint. IMPRESSION: Chest Impression: 1. New large thick-walled cavitary lesion in the RIGHT upper lobe is suspicious for CAVITARY PNEUMONIA or fungal infection. Lung cancer recurrence or radiation change are less likely. 2. Mild increased  soft tissue fullness within the RIGHT hilum likely relates to the above cavitary process. Abdomen / Pelvis Impression: 1. No evidence metastasis in the soft tissue . 2. Stable  sclerotic lesion at L5 and the RIGHT iliac bone. These results will be called to the ordering clinician or representative by the Radiologist Assistant, and communication documented in the PACS or zVision Dashboard. Electronically Signed   By: Suzy Bouchard M.D.   On: 01/19/2017 18:28    ASSESSMENT AND PLAN:  This is a very pleasant 61 years old white male with a stage IV non-small cell lung cancer, adenocarcinoma status post 6 cycles of induction systemic chemotherapy with carboplatin, Alimta and Avastin with partial response followed by maintenance treatment with Alimta and Avastin for 3 cycles. His treatment is currently on hold secondary to development of cavitary pneumonia in the right upper lobe. I recommended for the patient to hold his treatment for 3 more weeks until complete improvement of the cavitary pneumonia in the right upper lobe. I will see him back for follow-up visit in 3 weeks before resuming his systemic chemotherapy with single agent Alimta with cycle #4. For the cavitary pneumonia, he completed a course of doxycycline and has improvement in his general condition. He is scheduled to see Dr. Concepcion Living tomorrow for reevaluation. For pain management, I adjusted his pain medication to MS Contin 30 mg by mouth every 8 hours and I gave the patient referral for MS Contin as well as oxycodone. He was advised to call immediately if he has any concerning symptoms in the interval. The patient voices understanding of current disease status and treatment options and is in agreement with the current care plan. All questions were answered. The patient knows to call the clinic with any problems, questions or concerns. We can certainly see the patient much sooner if necessary.  Disclaimer: This note was dictated with voice recognition software. Similar sounding words can inadvertently be transcribed and may not be corrected upon review.

## 2017-02-11 NOTE — Telephone Encounter (Signed)
Gave patient AVS and calender per 5/16 los.

## 2017-02-12 ENCOUNTER — Encounter: Payer: Self-pay | Admitting: Acute Care

## 2017-02-12 ENCOUNTER — Ambulatory Visit (INDEPENDENT_AMBULATORY_CARE_PROVIDER_SITE_OTHER): Payer: Medicaid Other | Admitting: Acute Care

## 2017-02-12 ENCOUNTER — Telehealth: Payer: Self-pay | Admitting: Acute Care

## 2017-02-12 ENCOUNTER — Ambulatory Visit (INDEPENDENT_AMBULATORY_CARE_PROVIDER_SITE_OTHER)
Admission: RE | Admit: 2017-02-12 | Discharge: 2017-02-12 | Disposition: A | Discharge status: Home / Self Care | Payer: Medicaid Other | Source: Ambulatory Visit | Attending: Pulmonary Disease | Admitting: Pulmonary Disease

## 2017-02-12 VITALS — BP 104/60 | HR 100 | Ht 74.0 in | Wt 158.8 lb

## 2017-02-12 DIAGNOSIS — J189 Pneumonia, unspecified organism: Secondary | ICD-10-CM | POA: Diagnosis not present

## 2017-02-12 DIAGNOSIS — J984 Other disorders of lung: Secondary | ICD-10-CM

## 2017-02-12 DIAGNOSIS — C3411 Malignant neoplasm of upper lobe, right bronchus or lung: Secondary | ICD-10-CM

## 2017-02-12 NOTE — Assessment & Plan Note (Addendum)
Patient has completed treatment with doxycycline Clinically feels better BD Glucan 149 Other cultures either pending or negative Chest x-ray with persistent prominent cavity/adjacent infiltrate right upper lobe Suspect infectious/malignant Plan We will do a CXR today We will call you with the results. We will let you know if we need to proceed with bronch. I will touch base with Dr. Vaughan Browner regarding follow up plans for abnormal lab, and plan for treatment.. ( Consider referral to infectious disease) Follow up to be determined. Please contact office for sooner follow up if symptoms do not improve or worsen or seek emergency care

## 2017-02-12 NOTE — Telephone Encounter (Signed)
Please call patient and let him know his CXR continued to show a persistent prominent cavity. Let him know I am working with Dr. Vaughan Browner in the hospital this morning and will discuss plan of care for treatment and we will call him back this afternoon.Thanks so much!!

## 2017-02-12 NOTE — Progress Notes (Addendum)
History of Present Illness Edward Spence is a 61 y.o. male current smoker with Stage IV (T3, N3, M1b) non-small cell lung cancer, adenocarcinoma diagnosed in August 2017. He is followed by Dr. Vaughan Browner.  HPI: History of Stage IV (T3, N3, M1b) non-small cell lung cancer, adenocarcinoma diagnosed in August 2017 when he presented with right upper lobe lung nodule, mediastinal lymphadenopathy, bone metastasis. He is currently on chemotherapy with altima and avastin,  radiation with partial response. Avastin was discontinued after development of right upper lobe cavitary lesion seen on a follow-up CT on 4/23.  Patient reported no fevers or chills. He has chronic cough with white sputum production mostly in the morning. Denied any dyspnea, fevers, hemoptysis. His chief complaint  pain in the right shoulder and chest when he moves his arm.  02/13/2017 Follow Up visit. Pt. Presents for follow up. He was seen by Dr. Vaughan Browner 01/29/2017 for new RUL cavitary lesion.Location of lesion was consistent with infectious process, bacterial , fungal or mycobacterial.Plan at that time was:  Plan/Recommendations: - Finish doxy course - Check sputum cx - Quantiferon, beta d glucan, galactomannan, CBC with diff - Follow up up in 2 weeks with xray.   He has completed his Doxycycline.He states he was feeling great after the Doxycycline. He did have a cough for about a day, which is getting better.He states he feels well. He does still have an occasional  cough. It is sometimes productive and sometimes non productive. He had his last chemotherapy 12/28/2016. He is planning to resume chemo once we have determined the source of this cavitary lesion.He denies fever, chest pain, orthopnea or hemoptysis.He states he feels clinically better.  Culture Results: 02/02/2017>> AFB: Preliminary Negative Respiratory Sputum Culture>> Normal Flora (1-3)-B-D-Glucan pg/mL 149    Interpretation  POSITIVE      Ref Range & Units 10d ago   Index Value <0.50 0.18   Aspergillus Ag, EIA Not Detected Not Detected    02/02/2017: Fungal Culture>No fungal elements seen.  Test Results:  02/12/2017 CXR Persistent prominent cavity with adjacent infiltrate right upper lobe again noted. This could be infectious and/or malignant. Similar findings noted on prior CT of 01/19/2017.  CT 05/14/16-right upper lobe mediastinal mass measuring 5.4 x 7.3 x 5.9 cm., Spiculated right upper lobe nodule showing 14 x 12 mm. Right hilar mediastinal lymph nodes CT scan 09/01/16- improvement in mediastinal mass, right upper lobe lung nodule and lymphadenopathy CT chest 10/29/16- continued improvement in mediastinal mass, lymphadenopathy and right upper lobe nodule. CT 01/19/17-  interval development of large right upper lobe cavitary lesion, fullness in the right hilum. Right upper lobe nodule no change from prior   CBC Latest Ref Rng & Units 02/11/2017 01/29/2017 01/21/2017  WBC 4.0 - 10.3 10e3/uL 6.6 8.2 8.8  Hemoglobin 13.0 - 17.1 g/dL 9.7(L) 9.9(L) 8.1(L)  Hematocrit 38.4 - 49.9 % 33.3(L) 31.1(L) 25.4(L)  Platelets 140 - 400 10e3/uL 307 307.0 206    BMP Latest Ref Rng & Units 02/11/2017 01/21/2017 12/31/2016  Glucose 70 - 140 mg/dl 84 95 121  BUN 7.0 - 26.0 mg/dL 9.5 7.8 12.8  Creatinine 0.7 - 1.3 mg/dL 0.7 0.7 0.9  Sodium 136 - 145 mEq/L 138 139 141  Potassium 3.5 - 5.1 mEq/L 4.4 3.6 4.6  Chloride 101 - 111 mmol/L - - -  CO2 22 - 29 mEq/L '24 23 23  '$ Calcium 8.4 - 10.4 mg/dL 9.3 9.2 10.1    Dg Chest 2 View  Result Date: 02/12/2017 CLINICAL DATA:  Cough and congestion. EXAM: CHEST  2 VIEW COMPARISON:  CT 01/19/2017. FINDINGS: Mediastinum and hilar structures stable. Heart size stable. Prominent cavity with adjacent infiltrate right upper lobe is again noted. Similar findings noted on prior CT. No pleural effusion or pneumothorax. No acute bony abnormality. IMPRESSION: Persistent prominent cavity with adjacent infiltrate right upper lobe again noted. This  could be infectious and/or malignant. Similar findings noted on prior CT of 01/19/2017. Electronically Signed   By: Marcello Moores  Register   On: 02/12/2017 10:31   Ct Chest W Contrast  Result Date: 01/19/2017 CLINICAL DATA:  Stage IV metastatic non-small cell lung adenocarcinoma diagnosed in August 2017 with RIGHT upper lobe nodule, superior mediastinal mass and bone metastasis. EXAM: CT CHEST, ABDOMEN, AND PELVIS WITH CONTRAST TECHNIQUE: Multidetector CT imaging of the chest, abdomen and pelvis was performed following the standard protocol during bolus administration of intravenous contrast. CONTRAST:  112m ISOVUE-300 IOPAMIDOL (ISOVUE-300) INJECTION 61% COMPARISON:  CT 10/29/2016, PET-CT 05/29/2016 FINDINGS: CT CHEST FINDINGS Cardiovascular: No significant vascular findings. Normal heart size. No pericardial effusion. Mediastinum/Nodes: No axillary or supraclavicular adenopathy. Ill-defined tissue planes in the upper RIGHT mediastinal fat are similar to prior. No discrete lymphadenopathy. There is thickening in the RIGHT lower paratracheal / hilar regions . There is RIGHT suprahilar fullness. Lungs/Pleura: New thick-walled cavitary lesion in the RIGHT upper lobe. The large cavitary lesion measures 5.4 x 5 4.9 cm. There is consolidative airspace / fluid surrounding the cavitary lesion extending in the interstitium and airspace of the RIGHT upper lobe. Treated nodule at the more superior RIGHT upper lobe measuring 9 mm by 8 mm (image 44, series 40 and not changed from prior. RIGHT middle lobe is clear.  The RIGHT lower lobe is clear No lesions within the LEFT upper lobe. Mild bronchial plugging in the LEFT lower lobe (image 120, series 4. Musculoskeletal: No aggressive osseous lesion. CT ABDOMEN AND PELVIS FINDINGS Hepatobiliary:  No focal hepatic lesion. Pancreas: Pancreas is normal. No ductal dilatation. No pancreatic inflammation. Spleen: Normal spleen Adrenals/urinary tract: Adrenal glands and kidneys are normal.  The ureters and bladder normal. Stomach/Bowel: Stomach, small bowel, appendix, and cecum are normal. The colon and rectosigmoid colon are normal. Vascular/Lymphatic: Abdominal aorta is normal caliber with atherosclerotic calcification. There is no retroperitoneal or periportal lymphadenopathy. No pelvic lymphadenopathy. Reproductive: Prostate normal Other: No free fluid. Musculoskeletal: Round sclerotic lesion in the L5 vertebral body measures 15 mm (image 103, series 7). This lesion is stable. Stable lesion in the medial RIGHT inferior iliac bone along the SI joint. IMPRESSION: Chest Impression: 1. New large thick-walled cavitary lesion in the RIGHT upper lobe is suspicious for CAVITARY PNEUMONIA or fungal infection. Lung cancer recurrence or radiation change are less likely. 2. Mild increased soft tissue fullness within the RIGHT hilum likely relates to the above cavitary process. Abdomen / Pelvis Impression: 1. No evidence metastasis in the soft tissue . 2. Stable sclerotic lesion at L5 and the RIGHT iliac bone. These results will be called to the ordering clinician or representative by the Radiologist Assistant, and communication documented in the PACS or zVision Dashboard. Electronically Signed   By: SSuzy BouchardM.D.   On: 01/19/2017 18:28   Ct Abdomen Pelvis W Contrast  Result Date: 01/19/2017 CLINICAL DATA:  Stage IV metastatic non-small cell lung adenocarcinoma diagnosed in August 2017 with RIGHT upper lobe nodule, superior mediastinal mass and bone metastasis. EXAM: CT CHEST, ABDOMEN, AND PELVIS WITH CONTRAST TECHNIQUE: Multidetector CT imaging of the chest, abdomen and pelvis was performed  following the standard protocol during bolus administration of intravenous contrast. CONTRAST:  121m ISOVUE-300 IOPAMIDOL (ISOVUE-300) INJECTION 61% COMPARISON:  CT 10/29/2016, PET-CT 05/29/2016 FINDINGS: CT CHEST FINDINGS Cardiovascular: No significant vascular findings. Normal heart size. No pericardial  effusion. Mediastinum/Nodes: No axillary or supraclavicular adenopathy. Ill-defined tissue planes in the upper RIGHT mediastinal fat are similar to prior. No discrete lymphadenopathy. There is thickening in the RIGHT lower paratracheal / hilar regions . There is RIGHT suprahilar fullness. Lungs/Pleura: New thick-walled cavitary lesion in the RIGHT upper lobe. The large cavitary lesion measures 5.4 x 5 4.9 cm. There is consolidative airspace / fluid surrounding the cavitary lesion extending in the interstitium and airspace of the RIGHT upper lobe. Treated nodule at the more superior RIGHT upper lobe measuring 9 mm by 8 mm (image 44, series 40 and not changed from prior. RIGHT middle lobe is clear.  The RIGHT lower lobe is clear No lesions within the LEFT upper lobe. Mild bronchial plugging in the LEFT lower lobe (image 120, series 4. Musculoskeletal: No aggressive osseous lesion. CT ABDOMEN AND PELVIS FINDINGS Hepatobiliary:  No focal hepatic lesion. Pancreas: Pancreas is normal. No ductal dilatation. No pancreatic inflammation. Spleen: Normal spleen Adrenals/urinary tract: Adrenal glands and kidneys are normal. The ureters and bladder normal. Stomach/Bowel: Stomach, small bowel, appendix, and cecum are normal. The colon and rectosigmoid colon are normal. Vascular/Lymphatic: Abdominal aorta is normal caliber with atherosclerotic calcification. There is no retroperitoneal or periportal lymphadenopathy. No pelvic lymphadenopathy. Reproductive: Prostate normal Other: No free fluid. Musculoskeletal: Round sclerotic lesion in the L5 vertebral body measures 15 mm (image 103, series 7). This lesion is stable. Stable lesion in the medial RIGHT inferior iliac bone along the SI joint. IMPRESSION: Chest Impression: 1. New large thick-walled cavitary lesion in the RIGHT upper lobe is suspicious for CAVITARY PNEUMONIA or fungal infection. Lung cancer recurrence or radiation change are less likely. 2. Mild increased soft tissue  fullness within the RIGHT hilum likely relates to the above cavitary process. Abdomen / Pelvis Impression: 1. No evidence metastasis in the soft tissue . 2. Stable sclerotic lesion at L5 and the RIGHT iliac bone. These results will be called to the ordering clinician or representative by the Radiologist Assistant, and communication documented in the PACS or zVision Dashboard. Electronically Signed   By: SSuzy BouchardM.D.   On: 01/19/2017 18:28     Past medical hx Past Medical History:  Diagnosis Date  . Cavitary pneumonia 01/21/2017  . Constipation 07/23/2016  . Encounter for antineoplastic chemotherapy 06/24/2016  . [place] [place]   . Lung mass 05/19/2016  . Pneumonia    in college     Social History  Substance Use Topics  . Smoking status: Current Every Day Smoker    Packs/day: 0.25    Years: 40.00    Types: Cigarettes    Last attempt to quit: 04/29/2016  . Smokeless tobacco: Never Used     Comment: 1-5 per day  . Alcohol use No    Tobacco Cessation: Current every day smoker. I have spent 3 minutes counseling patient on smoking cessation this visit.  Past surgical hx, Family hx, Social hx all reviewed.  Current Outpatient Prescriptions on File Prior to Visit  Medication Sig  . dexamethasone (DECADRON) 4 MG tablet 4 mg by mouth twice a day the day before, day of and day after the chemotherapy every 3 weeks  . morphine (MS CONTIN) 30 MG 12 hr tablet Take 1 tablet (30 mg total) by mouth 3 (  three) times daily.  Marland Kitchen oxyCODONE (OXY IR/ROXICODONE) 5 MG immediate release tablet Take 1-2 tablets (5-10 mg total) by mouth every 4 (four) hours as needed for severe pain.  Marland Kitchen sennosides-docusate sodium (SENOKOT-S) 8.6-50 MG tablet Take 1 tablet by mouth 2 (two) times daily as needed for constipation.   No current facility-administered medications on file prior to visit.      Allergies  Allergen Reactions  . Penicillins Hives    Had penicillin it at age 31 and " I got trench mouth  and hives "    Review Of Systems:  Constitutional:   +  weight loss, no night sweats,  Fevers, chills, fatigue, or  lassitude.  HEENT:   No headaches,  Difficulty swallowing,  Tooth/dental problems, or  Sore throat,                No sneezing, itching, ear ache, nasal congestion, post nasal drip,   CV:  No chest pain,  Orthopnea, PND, swelling in lower extremities, anasarca, dizziness, palpitations, syncope.   GI  No heartburn, indigestion, abdominal pain, nausea, vomiting, diarrhea, change in bowel habits, loss of appetite, bloody stools.   Resp: + shortness of breath with exertion not  at rest.  No excess mucus, occasional  productive cough,  No non-productive cough,  No coughing up of blood.  + change in color of mucus.  No wheezing.  No chest wall deformity  Skin: no rash or lesions.  GU: no dysuria, change in color of urine, no urgency or frequency.  No flank pain, no hematuria   MS:  No joint pain or swelling.  No decreased range of motion.  + back pain.  Psych:  No change in mood or affect. No depression or anxiety.  No memory loss.   Vital Signs BP 104/60 (BP Location: Left Arm, Cuff Size: Normal)   Pulse 100   Ht '6\' 2"'$  (1.88 m)   Wt 158 lb 12.8 oz (72 kg)   SpO2 (!) 89%   BMI 20.39 kg/m    Physical Exam:  General- No distress,  A&Ox3, plesant ENT: No sinus tenderness, TM clear, pale nasal mucosa, no oral exudate,no post nasal drip, no LAN Cardiac: S1, S2, regular rate and rhythm, no murmur Chest: No wheeze/ rales/ dullness; no accessory muscle use, no nasal flaring, no sternal retractions, diminished per bases Abd.: Soft Non-tender Ext: No clubbing cyanosis, edema Neuro:  normal strength, deconditioned at baseline Skin: No rashes, warm and dry Psych: normal mood and behavior   Assessment/Plan  Cavitary pneumonia Patient has completed treatment with doxycycline Clinically feels better BD Glucan 149 Other cultures either pending or negative Chest x-ray  with persistent prominent cavity/adjacent infiltrate right upper lobe Suspect infectious/malignant Plan We will do a CXR today We will call you with the results. We will let you know if we need to proceed with bronch. I will touch base with Dr. Vaughan Browner regarding follow up plans for abnormal lab, and plan for treatment.. ( Consider referral to infectious disease) Follow up to be determined. Please contact office for sooner follow up if symptoms do not improve or worsen or seek emergency care    Addendum: 02/13/2017 Pt. Has been scheduled for diagnostic bronchoscopy with washings  5/25 at 11 am with Dr. Vaughan Browner to determine micro of cavitary lesion. He will have PT INR check 02/19/17 to be collected at [facility] one day  prior to procedure. Ambulatory referral has been made to ID per Dr. Matilde Bash request. Follow  up after bronch per Dr. Vaughan Browner.   Magdalen Spatz, NP 02/13/2017  1:54 PM

## 2017-02-12 NOTE — Patient Instructions (Addendum)
It is nice to meet you today. We will do a CXR today We will call you with the results. We will let you know if we need to proceed with bronch. I will touch base with Dr. Vaughan Browner regarding follow up plans for abnormal lab, and plan for treatment.. Please contact office for sooner follow up if symptoms do not improve or worsen or seek emergency care

## 2017-02-13 ENCOUNTER — Other Ambulatory Visit: Payer: Self-pay | Admitting: Acute Care

## 2017-02-13 DIAGNOSIS — J984 Other disorders of lung: Secondary | ICD-10-CM

## 2017-02-13 DIAGNOSIS — Z01818 Encounter for other preprocedural examination: Secondary | ICD-10-CM

## 2017-02-13 NOTE — Telephone Encounter (Signed)
I have called Edward Spence to update him on the plan of care for his continued Persistent prominent RUL cavity with adjacent infiltrate, despite his treatment with antibiotics.I explained that I have spoken to Dr. Vaughan Browner, and we have reviewed labs ( elevated BD Glucan) and imaging. I explained that Dr. Vaughan Browner feels bronchoscopy is the best next step to identify organism. I told him Dr. Vaughan Browner has scheduled the procedure for 02/20/2017 at 11 am. I explained he will need to be at [facility]  at 9 am.I explained that we will need to draw some lab work prior to the procedure. I explained that someone will call him with further instructions. He verbalized understanding of the above and had no further questions upon completion of the call.

## 2017-02-18 ENCOUNTER — Telehealth: Payer: Self-pay | Admitting: Acute Care

## 2017-02-18 DIAGNOSIS — Z01818 Encounter for other preprocedural examination: Secondary | ICD-10-CM

## 2017-02-18 NOTE — Telephone Encounter (Signed)
Spoke with SG, states that pt needs pt inr and cbc prior to bronch.  These have been ordered. Spoke with pt, aware to go to lab to have these drawn.  Pt will have labs drawn at [facility].  Nothing further needed.

## 2017-02-19 ENCOUNTER — Other Ambulatory Visit (HOSPITAL_COMMUNITY)
Admission: RE | Admit: 2017-02-19 | Discharge: 2017-02-19 | Disposition: A | Discharge status: Home / Self Care | Payer: Medicaid Other | Source: Ambulatory Visit | Attending: Acute Care | Admitting: Acute Care

## 2017-02-19 DIAGNOSIS — Z01818 Encounter for other preprocedural examination: Secondary | ICD-10-CM | POA: Diagnosis present

## 2017-02-19 LAB — CBC WITH DIFFERENTIAL/PLATELET
Basophils Absolute: 0 10*3/uL (ref 0.0–0.1)
Basophils Relative: 1 %
EOS ABS: 0.1 10*3/uL (ref 0.0–0.7)
Eosinophils Relative: 2 %
HCT: 32.9 % — ABNORMAL LOW (ref 39.0–52.0)
HEMOGLOBIN: 9.7 g/dL — AB (ref 13.0–17.0)
LYMPHS ABS: 1 10*3/uL (ref 0.7–4.0)
Lymphocytes Relative: 19 %
MCH: 29.5 pg (ref 26.0–34.0)
MCHC: 29.5 g/dL — AB (ref 30.0–36.0)
MCV: 100 fL (ref 78.0–100.0)
MONO ABS: 0.7 10*3/uL (ref 0.1–1.0)
MONOS PCT: 14 %
NEUTROS PCT: 64 %
Neutro Abs: 3.4 10*3/uL (ref 1.7–7.7)
Platelets: 290 10*3/uL (ref 150–400)
RBC: 3.29 MIL/uL — ABNORMAL LOW (ref 4.22–5.81)
RDW: 19 % — ABNORMAL HIGH (ref 11.5–15.5)
WBC: 5.2 10*3/uL (ref 4.0–10.5)

## 2017-02-19 LAB — PROTIME-INR
INR: 1.09
Prothrombin Time: 14.2 seconds (ref 11.4–15.2)

## 2017-02-20 ENCOUNTER — Telehealth: Payer: Self-pay | Admitting: Pulmonary Disease

## 2017-02-20 ENCOUNTER — Ambulatory Visit (HOSPITAL_COMMUNITY): Admission: RE | Admit: 2017-02-20 | Payer: Medicaid Other | Source: Ambulatory Visit | Admitting: Pulmonary Disease

## 2017-02-20 ENCOUNTER — Encounter (HOSPITAL_COMMUNITY): Payer: Medicaid Other

## 2017-02-20 ENCOUNTER — Encounter (HOSPITAL_COMMUNITY): Admission: RE | Payer: Self-pay | Source: Ambulatory Visit

## 2017-02-20 ENCOUNTER — Inpatient Hospital Stay (HOSPITAL_COMMUNITY)
Admission: RE | Admit: 2017-02-20 | Discharge: 2017-02-20 | Disposition: A | Discharge status: Home / Self Care | Payer: Medicaid Other | Source: Ambulatory Visit

## 2017-02-20 SURGERY — BRONCHOSCOPY, WITH FLUOROSCOPY
Anesthesia: Moderate Sedation | Laterality: Bilateral

## 2017-02-20 NOTE — Telephone Encounter (Signed)
Mr Roots did not arrive for his bronch today AM so it had to be cancelled. He was called yesterday to remind him that he needed to arrive before 7 am but is not here yet.   Please call him to reschedule. I can do Friday, June1st morning after 8:30 am.   PM

## 2017-02-20 NOTE — Telephone Encounter (Signed)
lmtcb x1 for pt.  Will call to reschedule after speaking with pt.

## 2017-02-24 ENCOUNTER — Telehealth: Payer: Self-pay

## 2017-02-24 NOTE — Telephone Encounter (Signed)
Patient is calling back to schedule bronch, CB is [phone]

## 2017-02-24 NOTE — Telephone Encounter (Signed)
Dr. Vaughan Browner, did you want this to be done at [facility] again or [place]? Please advise.

## 2017-02-24 NOTE — Telephone Encounter (Signed)
Pt needs oxycodone and morphine refill.   Procedure at 8 showed up at 740 and was told was too late to take it. Bronchoscopy at [facility]. Did not reschedule. They were kind of rude and said good bye. He does not like Grenelefe, he would like to change if he can, if there is time.  #45 of oxycodone this is not lasting long with taking a minimum of 3/day

## 2017-02-24 NOTE — Telephone Encounter (Signed)
Called and spoke with pt and he stated that his wife is not home at the moment and when she comes back he will call us back to get the bronch rescheduled.

## 2017-02-24 NOTE — Telephone Encounter (Signed)
lmomtcb x 1 for pt to see about rescheduling his bronch with RA>

## 2017-02-24 NOTE — Telephone Encounter (Addendum)
Refill request for morphine- pt given 30 day supply on 5/16, next refill due on 6/16 Refill request for oxycodone- pt given last rx on 5/16, next refill not due until 5/31. Will review with MD prior to rx being refilled

## 2017-02-24 NOTE — Telephone Encounter (Signed)
Patient returning call - he can be reached at [phone] -pr

## 2017-02-25 ENCOUNTER — Telehealth: Payer: Self-pay | Admitting: Medical Oncology

## 2017-02-25 DIAGNOSIS — C3411 Malignant neoplasm of upper lobe, right bronchus or lung: Secondary | ICD-10-CM

## 2017-02-25 DIAGNOSIS — J189 Pneumonia, unspecified organism: Secondary | ICD-10-CM

## 2017-02-25 DIAGNOSIS — J984 Other disorders of lung: Secondary | ICD-10-CM

## 2017-02-25 DIAGNOSIS — Z5111 Encounter for antineoplastic chemotherapy: Secondary | ICD-10-CM

## 2017-02-25 MED ORDER — OXYCODONE HCL 5 MG PO TABS: 5.0000 mg | ORAL_TABLET | ORAL | 0 refills | Status: DC | PRN

## 2017-02-25 NOTE — Telephone Encounter (Signed)
Pt is aware of below message and voiced his understanding. Nothing further needed.

## 2017-02-25 NOTE — Telephone Encounter (Signed)
It has been rescheduled for Friday 6/1 at [place] at 8 am. Remind him that he needs to arrive around 6:45 AM.  Thanks PM

## 2017-02-25 NOTE — Telephone Encounter (Signed)
Pt will pick up rx on Friday.

## 2017-02-27 ENCOUNTER — Encounter (HOSPITAL_COMMUNITY)
Admission: RE | Disposition: A | Discharge status: Home / Self Care | Payer: Self-pay | Source: Ambulatory Visit | Attending: Pulmonary Disease

## 2017-02-27 ENCOUNTER — Ambulatory Visit (HOSPITAL_COMMUNITY): Payer: Medicaid Other

## 2017-02-27 ENCOUNTER — Ambulatory Visit (HOSPITAL_COMMUNITY)
Admission: RE | Admit: 2017-02-27 | Discharge: 2017-02-27 | Disposition: A | Discharge status: Home / Self Care | Payer: Medicaid Other | Source: Ambulatory Visit | Attending: Pulmonary Disease | Admitting: Pulmonary Disease

## 2017-02-27 ENCOUNTER — Telehealth: Payer: Self-pay | Admitting: Pulmonary Disease

## 2017-02-27 DIAGNOSIS — C3411 Malignant neoplasm of upper lobe, right bronchus or lung: Secondary | ICD-10-CM | POA: Diagnosis not present

## 2017-02-27 DIAGNOSIS — J984 Other disorders of lung: Secondary | ICD-10-CM | POA: Diagnosis present

## 2017-02-27 DIAGNOSIS — D849 Immunodeficiency, unspecified: Secondary | ICD-10-CM | POA: Insufficient documentation

## 2017-02-27 DIAGNOSIS — R0602 Shortness of breath: Secondary | ICD-10-CM

## 2017-02-27 DIAGNOSIS — R918 Other nonspecific abnormal finding of lung field: Secondary | ICD-10-CM

## 2017-02-27 HISTORY — PX: VIDEO BRONCHOSCOPY: SHX5072

## 2017-02-27 LAB — BODY FLUID CELL COUNT WITH DIFFERENTIAL
Eos, Fluid: 1 %
LYMPHS FL: 13 %
Monocyte-Macrophage-Serous Fluid: 6 % — ABNORMAL LOW (ref 50–90)
Neutrophil Count, Fluid: 80 % — ABNORMAL HIGH (ref 0–25)
Total Nucleated Cell Count, Fluid: 410 cu mm (ref 0–1000)

## 2017-02-27 SURGERY — BRONCHOSCOPY, WITH FLUOROSCOPY
Anesthesia: Moderate Sedation | Laterality: Bilateral

## 2017-02-27 MED ORDER — FENTANYL CITRATE (PF) 100 MCG/2ML IJ SOLN
INTRAMUSCULAR | Status: DC | PRN
  Administered 2017-02-27 (×4): 25 ug via INTRAVENOUS

## 2017-02-27 MED ORDER — LIDOCAINE HCL 1 % IJ SOLN
INTRAMUSCULAR | Status: DC | PRN
  Administered 2017-02-27: 6 mL via RESPIRATORY_TRACT

## 2017-02-27 MED ORDER — PHENYLEPHRINE HCL 0.25 % NA SOLN: 1.0000 | Freq: Four times a day (QID) | NASAL | Status: DC | PRN

## 2017-02-27 MED ORDER — BUTAMBEN-TETRACAINE-BENZOCAINE 2-2-14 % EX AERO: 1.0000 | INHALATION_SPRAY | Freq: Once | CUTANEOUS | Status: DC

## 2017-02-27 MED ORDER — PHENYLEPHRINE HCL 0.25 % NA SOLN
NASAL | Status: DC | PRN
  Administered 2017-02-27: 2 via NASAL

## 2017-02-27 MED ORDER — SODIUM CHLORIDE 0.9 % IV SOLN
INTRAVENOUS | Status: DC
  Administered 2017-02-27: 08:00:00 via INTRAVENOUS

## 2017-02-27 MED ORDER — LIDOCAINE HCL 2 % EX GEL
CUTANEOUS | Status: DC | PRN
  Administered 2017-02-27: 1

## 2017-02-27 MED ORDER — FENTANYL CITRATE (PF) 100 MCG/2ML IJ SOLN
INTRAMUSCULAR | Status: AC
Start: 2017-02-27 — End: 2017-02-27
  Filled 2017-02-27: qty 4

## 2017-02-27 MED ORDER — MIDAZOLAM HCL 10 MG/2ML IJ SOLN
INTRAMUSCULAR | Status: DC | PRN
  Administered 2017-02-27 (×4): 1 mg via INTRAVENOUS

## 2017-02-27 MED ORDER — LIDOCAINE HCL 2 % EX GEL: 1.0000 "application " | Freq: Once | CUTANEOUS | Status: DC

## 2017-02-27 MED ORDER — MIDAZOLAM HCL 5 MG/ML IJ SOLN
INTRAMUSCULAR | Status: AC
  Filled 2017-02-27: qty 2

## 2017-02-27 MED ORDER — ALBUTEROL SULFATE HFA 108 (90 BASE) MCG/ACT IN AERS: 2.0000 | INHALATION_SPRAY | Freq: Four times a day (QID) | RESPIRATORY_TRACT | 2 refills | Status: DC | PRN

## 2017-02-27 MED ORDER — LEVOFLOXACIN 750 MG PO TABS: 750.0000 mg | ORAL_TABLET | Freq: Every day | ORAL | 0 refills | Status: DC

## 2017-02-27 NOTE — Discharge Instructions (Signed)
Flexible Bronchoscopy, Care After These instructions give you information on caring for yourself after your procedure. Your doctor may also give you more specific instructions. Call your doctor if you have any problems or questions after your procedure. Follow these instructions at home:  Do not eat or drink anything for 2 hours after your procedure. If you try to eat or drink before the medicine wears off, food or drink could go into your lungs. You could also burn yourself.  After 2 hours have passed and when you can cough and gag normally, you may eat soft food and drink liquids slowly.  The day after the test, you may eat your normal diet.  You may do your normal activities.  Keep all doctor visits. Get help right away if:  You get more and more short of breath.  You get light-headed.  You feel like you are going to pass out (faint).  You have chest pain.  You have new problems that worry you.  You cough up more than a little blood.  You cough up more blood than before. This information is not intended to replace advice given to you by your health care provider. Make sure you discuss any questions you have with your health care provider. Document Released: 07/13/2009 Document Revised: 02/21/2016 Document Reviewed: 05/20/2013 Elsevier Interactive Patient Education  2017 [place] not eat or drink until after 10:30 today 02/27/17

## 2017-02-27 NOTE — Progress Notes (Signed)
Video bronchoscopy performed Intervention bronchial washings Intervention bronchial biopsy Pt tolerated well  Kathie Dike RRT

## 2017-02-27 NOTE — H&P (Signed)
Pt seen and examined in bronch suite Please see clinic note from 5/17 for details  Blood pressure 117/66, temperature 98.3 F (36.8 C), temperature source Oral, resp. rate 12, SpO2 98 %. Gen:      No acute distress HEENT:  EOMI, sclera anicteric Neck:     No masses; no thyromegaly Lungs:    Clear to auscultation bilaterally; normal respiratory effort CV:         Regular rate and rhythm; no murmurs Abd:      + bowel sounds; soft, non-tender; no palpable masses, no distension Ext:    No edema; adequate peripheral perfusion Skin:      Warm and dry; no rash Neuro: alert and oriented x 3 Psych: normal mood and affect  Assessment/Plan Evaluation for right upper lobe cavitary lesion Stage IV lung adenocarcinoma  Ok to proceed with Bronchoscopy.  Marshell Garfinkel MD [facility] Pager [phone] If no answer or after 3pm call: [phone] 02/27/2017, 8:11 AM

## 2017-02-27 NOTE — Op Note (Signed)
[facility] Cardiopulmonary Patient Name: Edward Spence Procedure Date: 02/27/2017 MRN: [id number] Attending MD: Marshell Garfinkel , MD Date of Birth: 07-19-1956 CSN: [id number] Age: 61 Admit Type: Outpatient Ethnicity: Not Hispanic or Latino Procedure:            Bronchoscopy Indications:          Right upper lobe cavitary lesion Providers:            Marshell Garfinkel, MD, Cherre Huger RRT, RCP, Ashley Mariner                        RRT,RCP Referring MD:         [name]. House (Referring MD) Medicines:            Midazolam 4 mg IV, Fentanyl 448 mcg IV Complications:        No immediate complications. Estimated Blood Loss: Estimated blood loss: none. Procedure:      Pre-Anesthesia Assessment:      - A History and Physical has been performed. Patient meds and allergies       have been reviewed. The risks and benefits of the procedure and the       sedation options and risks were discussed with the patient. All       questions were answered and informed consent was obtained. Patient       identification and proposed procedure were verified prior to the       procedure by the physician in the procedure room. Mental Status       Examination: alert and oriented. Airway Examination: normal       oropharyngeal airway. Respiratory Examination: clear to auscultation. CV       Examination: RRR, no murmurs, no S3 or S4. ASA Grade Assessment: III - A       patient with severe systemic disease. After reviewing the risks and       benefits, the patient was deemed in satisfactory condition to undergo       the procedure. The anesthesia plan was to use moderate sedation /       analgesia (conscious sedation). Immediately prior to administration of       medications, the patient was re-assessed for adequacy to receive       sedatives. The heart rate, respiratory rate, oxygen saturations, blood       pressure, adequacy of pulmonary ventilation, and response to care were       monitored  throughout the procedure. The physical status of the patient       was re-assessed after the procedure.      After obtaining informed consent, the bronchoscope was passed under       direct vision. Throughout the procedure, the patient's blood pressure,       pulse, and oxygen saturations were monitored continuously. the [id number]       [id number] scope was introduced through the left nostril and advanced to       the tracheobronchial tree. Findings:      The nasopharynx/oropharynx appears normal. The larynx appears normal.       The vocal cords appear normal. The subglottic space is normal. The       trachea is of normal caliber. The carina is sharp. The tracheobronchial       tree was examined to at least the first subsegmental level. Bronchial       mucosa and anatomy are normal; there are no  endobronchial lesions, and       no secretions.      Bronchoalveolar lavage was performed in the RUL posterior segment (B2)       and in the RUL anterior segment (B3) of the lung and sent for cell       count, bacterial culture, viral smears & culture, fungal & AFB analysis       and cytology for immunocompromised host protocol. 160 mL of fluid were       instilled. 80 mL were returned. The return was cloudy. There were no       mucoid plugs in the return fluid. Multiple specimens were obtained and       pooled into one specimen, which was sent for analysis.      Transbronchial biopsies of a lesion were performed in the posterior       segment of the right upper lobe and in the anterior segment of the right       upper lobe using alligator forceps and sent for histopathology       examination. The procedure was guided by fluoroscopy. Biopsy of lung       tissue was obtained. Four biopsy passes were performed. Four biopsy       samples were obtained. Unable to access the apical segment of the RUL       due to sharp takeoff angle. Impression:      - Right upper lobe cavitary lesion      - The  examination was normal.      - Bronchoalveolar lavage was performed.      - Transbronchial lung biopsies were performed. Moderate Sedation:      Moderate (conscious) sedation was administered by the endoscopy nurse       and supervised by the endoscopist. The following parameters were       monitored: oxygen saturation, heart rate, blood pressure, respiratory       rate, EKG, adequacy of pulmonary ventilation, and response to care.       Total physician intraservice time was 30 minutes. Recommendation:      - Await BAL and biopsy results.      - Start levaquin 750 mg/day for 14 days      - Start albuterol inhaler Procedure Code(s):      --- Professional ---      [phone], Bronchoscopy, rigid or flexible, including fluoroscopic guidance,       when performed; with transbronchial lung biopsy(s), single lobe      27062, Bronchoscopy, rigid or flexible, including fluoroscopic guidance,       when performed; with bronchial alveolar lavage      99152, Moderate sedation services provided by the same physician or       other qualified health care professional performing the diagnostic or       therapeutic service that the sedation supports, requiring the presence       of an independent trained observer to assist in the monitoring of the       patient's level of consciousness and physiological status; initial 15       minutes of intraservice time, patient age 33 years or older      [phone], Moderate sedation services; each additional 15 minutes       intraservice time Diagnosis Code(s):      --- Professional ---      R91.8, Other nonspecific abnormal finding of lung field CPT copyright 2016 American [employer] Association. All  rights reserved. The codes documented in this report are preliminary and upon coder review may  be revised to meet current compliance requirements. Marshell Garfinkel, MD 02/27/2017 9:47:24 AM Number of Addenda: 0 Scope In: 8:24:05 AM Scope Out: 8:40:30 AM

## 2017-02-27 NOTE — Telephone Encounter (Signed)
Pt has been scheduled for 03/11/17 @ 1:45. Pt is aware and voiced his understanding.  Nothing further needed.

## 2017-02-27 NOTE — Telephone Encounter (Signed)
Rx has been sent to preferred pharmacy. lmtcb x1 to scheduled for 2wk rov. Please schedule pt for 03/11/17 @ 1:45. Pt will need CXR piror to visit. CXR has been ordered.

## 2017-02-27 NOTE — Telephone Encounter (Signed)
Pt underwent bronch today without complication. CXR shows increased consolidation in right upper lobe  While we are waiting for results I will give another round of antibiotics and start him on inhaler as he has increased dyspnea with wheezing.  Call in Levaquin 750 mg/day for 2 weeks and albuterol PRN inhaler to [facility] in [place]. Follow up in clinic in 2 weeks with repeat CXR. Ok to use held spots if avaible.  Teresita Fanton

## 2017-02-28 ENCOUNTER — Encounter (HOSPITAL_COMMUNITY): Payer: Self-pay | Admitting: Pulmonary Disease

## 2017-02-28 LAB — ACID FAST SMEAR (AFB, MYCOBACTERIA): Acid Fast Smear: NEGATIVE

## 2017-02-28 LAB — PNEUMOCYSTIS JIROVECI SMEAR BY DFA: PNEUMOCYSTIS JIROVECI AG: NEGATIVE

## 2017-03-01 LAB — CULTURE, BAL-QUANTITATIVE

## 2017-03-01 LAB — CULTURE, BAL-QUANTITATIVE W GRAM STAIN: Culture: 5000 — AB

## 2017-03-02 LAB — FUNGUS CULTURE W SMEAR

## 2017-03-03 ENCOUNTER — Encounter: Payer: Self-pay | Admitting: Pharmacist

## 2017-03-03 NOTE — Telephone Encounter (Signed)
Sputum from 5/7 now shows unidentified mold. In the setting of positive beta d glucan we will need to treat this as a fungal infection of the lung. We are still awaiting the results of the BAL.  In the meantime we will start voriconazole 200 mg bid and refer to ID for further eval.  I left a voice mail in patients phone requesting a call back.  Gianmarco Roye

## 2017-03-04 ENCOUNTER — Encounter: Payer: Self-pay | Admitting: Internal Medicine

## 2017-03-04 ENCOUNTER — Ambulatory Visit: Payer: Medicaid Other

## 2017-03-04 ENCOUNTER — Telehealth: Payer: Self-pay | Admitting: Internal Medicine

## 2017-03-04 ENCOUNTER — Other Ambulatory Visit: Payer: Self-pay

## 2017-03-04 ENCOUNTER — Other Ambulatory Visit (HOSPITAL_BASED_OUTPATIENT_CLINIC_OR_DEPARTMENT_OTHER): Payer: Medicaid Other

## 2017-03-04 ENCOUNTER — Encounter: Payer: Medicaid Other | Admitting: Nutrition

## 2017-03-04 ENCOUNTER — Ambulatory Visit (HOSPITAL_BASED_OUTPATIENT_CLINIC_OR_DEPARTMENT_OTHER): Payer: Medicaid Other | Admitting: Internal Medicine

## 2017-03-04 VITALS — BP 107/64 | HR 104 | Temp 98.5°F | Resp 20 | Ht 74.0 in | Wt 155.9 lb

## 2017-03-04 DIAGNOSIS — C3411 Malignant neoplasm of upper lobe, right bronchus or lung: Secondary | ICD-10-CM

## 2017-03-04 DIAGNOSIS — D61810 Antineoplastic chemotherapy induced pancytopenia: Secondary | ICD-10-CM

## 2017-03-04 DIAGNOSIS — B49 Unspecified mycosis: Secondary | ICD-10-CM

## 2017-03-04 DIAGNOSIS — C7951 Secondary malignant neoplasm of bone: Secondary | ICD-10-CM

## 2017-03-04 LAB — CBC WITH DIFFERENTIAL/PLATELET
BASO%: 0.4 % (ref 0.0–2.0)
Basophils Absolute: 0 10*3/uL (ref 0.0–0.1)
EOS%: 1.3 % (ref 0.0–7.0)
Eosinophils Absolute: 0.1 10*3/uL (ref 0.0–0.5)
HCT: 28.8 % — ABNORMAL LOW (ref 38.4–49.9)
HGB: 9 g/dL — ABNORMAL LOW (ref 13.0–17.1)
LYMPH%: 10.6 % — AB (ref 14.0–49.0)
MCH: 28.6 pg (ref 27.2–33.4)
MCHC: 31.2 g/dL — ABNORMAL LOW (ref 32.0–36.0)
MCV: 91.8 fL (ref 79.3–98.0)
MONO#: 0.5 10*3/uL (ref 0.1–0.9)
MONO%: 11.6 % (ref 0.0–14.0)
NEUT%: 76.1 % — ABNORMAL HIGH (ref 39.0–75.0)
NEUTROS ABS: 3.4 10*3/uL (ref 1.5–6.5)
Platelets: 268 10*3/uL (ref 140–400)
RBC: 3.13 10*6/uL — AB (ref 4.20–5.82)
RDW: 19.2 % — ABNORMAL HIGH (ref 11.0–14.6)
WBC: 4.5 10*3/uL (ref 4.0–10.3)
lymph#: 0.5 10*3/uL — ABNORMAL LOW (ref 0.9–3.3)

## 2017-03-04 LAB — COMPREHENSIVE METABOLIC PANEL
ALT: 9 U/L (ref 0–55)
AST: 20 U/L (ref 5–34)
Albumin: 2.6 g/dL — ABNORMAL LOW (ref 3.5–5.0)
Alkaline Phosphatase: 84 U/L (ref 40–150)
Anion Gap: 9 mEq/L (ref 3–11)
BILIRUBIN TOTAL: 0.45 mg/dL (ref 0.20–1.20)
BUN: 11.6 mg/dL (ref 7.0–26.0)
CO2: 26 meq/L (ref 22–29)
CREATININE: 0.7 mg/dL (ref 0.7–1.3)
Calcium: 9.7 mg/dL (ref 8.4–10.4)
Chloride: 103 mEq/L (ref 98–109)
GLUCOSE: 108 mg/dL (ref 70–140)
Potassium: 4.3 mEq/L (ref 3.5–5.1)
SODIUM: 138 meq/L (ref 136–145)
TOTAL PROTEIN: 7.7 g/dL (ref 6.4–8.3)

## 2017-03-04 LAB — UA PROTEIN, DIPSTICK - CHCC: Protein, ur: NEGATIVE mg/dL

## 2017-03-04 MED ORDER — VORICONAZOLE 200 MG PO TABS: 200.0000 mg | ORAL_TABLET | Freq: Two times a day (BID) | ORAL | 2 refills | Status: DC

## 2017-03-04 NOTE — Telephone Encounter (Signed)
Gave patient AVS and calender per 6/6 LOS - [facility] to contact patient with ct schedule.

## 2017-03-04 NOTE — Progress Notes (Signed)
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  GUARDANT 360: Negative for EGFR, ALK, ROS 1, BRAF mutations but showed amplification of EGFR and MET  PRIOR THERAPY: 1) Palliative systemic chemotherapy to the right superior mediastinal and right paratracheal lymphadenopathy under the care of Dr. Lisbeth Renshaw. 2) Systemic chemotherapy with carboplatin for AUC of 5, Alimta 500 MG/M2 and Avastin 15 MG/KG every 3 weeks is status post 6 cycles. First cycle was given on 07/28/2016.   CURRENT THERAPY: Maintenance systemic chemotherapy with Alimta 500 MG/M2 and Avastin 15 MG/KG every 3 weeks. First dose 11/19/2016. Status post 3 cycles. Avastin was discontinued after cycle #3 secondary to development of right upper lobe cavitary lesion. Treatment is currently on hold secondary to the cavitary lesion in the right upper lobe.  INTERVAL HISTORY: Edward Spence 61 y.o. male returns to the clinic today for follow-up visit. The patient is feeling much better today. He recently underwent bronchoscopy under the care of Dr. Vaughan Browner that showed no concerning findings and no evidence of recurrent malignancy in that area. He is currently on treatment with Levaquin for the cavitary lesion in the right upper lobe. He continues to have mild right-sided chest pain as well as shortness of breath with exertion and mild cough. He denied having any recent hemoptysis. The patient has no nausea, vomiting, diarrhea or constipation. He has no fever or chills. He is here today for evaluation and recommendation regarding his  condition.   MEDICAL HISTORY: Past Medical History:  Diagnosis Date  . Cavitary pneumonia 01/21/2017  . Constipation 07/23/2016  . Encounter for antineoplastic chemotherapy 06/24/2016  . Lung cancer ([place])   . Lung mass 05/19/2016  . Pneumonia    in college    ALLERGIES:  is allergic to penicillins.  MEDICATIONS:  Current Outpatient Prescriptions  Medication Sig Dispense Refill  . albuterol (PROVENTIL HFA;VENTOLIN HFA) 108 (90 Base) MCG/ACT inhaler Inhale 2 puffs into the lungs every 6 (six) hours as needed for wheezing or shortness of breath. 1 Inhaler 2  . dexamethasone (DECADRON) 4 MG tablet 4 mg by mouth twice a day the day before, day of and day after the chemotherapy every 3 weeks 40 tablet 1  . levofloxacin (LEVAQUIN) 750 MG tablet Take 1 tablet (750 mg total) by mouth daily. 14 tablet 0  . morphine (MS CONTIN) 30 MG 12 hr tablet Take 1 tablet (30 mg total) by mouth 3 (three) times daily. 90 tablet 0  . oxyCODONE (OXY IR/ROXICODONE) 5 MG immediate release tablet Take 1-2 tablets (5-10 mg total) by mouth every 4 (four) hours as needed for severe pain. 45 tablet 0   No current facility-administered medications for this visit.     SURGICAL HISTORY:  Past Surgical History:  Procedure Laterality Date  . APPENDECTOMY    . VIDEO BRONCHOSCOPY Bilateral 02/27/2017   Procedure: VIDEO BRONCHOSCOPY WITH FLUORO;  Surgeon: Marshell Garfinkel, MD;  Location: [facility];  Service: Cardiopulmonary;  Laterality: Bilateral;  . VIDEO BRONCHOSCOPY WITH ENDOBRONCHIAL ULTRASOUND N/A 05/22/2016   Procedure: VIDEO BRONCHOSCOPY WITH ENDOBRONCHIAL ULTRASOUND;  Surgeon: [name], MD;  Location: [facility];  Service: Thoracic;  Laterality: N/A;    REVIEW OF SYSTEMS:  A comprehensive review of systems was negative except for: Constitutional: positive for fatigue and weight loss Respiratory: positive for cough, dyspnea on exertion, pleurisy/chest pain and sputum   PHYSICAL EXAMINATION: General  appearance: alert, cooperative, fatigued and no distress Head: Normocephalic, without obvious abnormality, atraumatic Neck: no adenopathy, no JVD, supple, symmetrical, trachea midline and thyroid not enlarged, symmetric, no tenderness/mass/nodules Lymph nodes: Cervical, supraclavicular, and axillary nodes normal. Resp: clear to auscultation bilaterally Back: symmetric, no curvature. ROM normal. No CVA tenderness. Cardio: regular rate and rhythm, S1, S2 normal, no murmur, click, rub or gallop GI: soft, non-tender; bowel sounds normal; no masses,  no organomegaly Extremities: extremities normal, atraumatic, no cyanosis or edema  ECOG PERFORMANCE STATUS: 1 - Symptomatic but completely ambulatory  Blood pressure 107/64, pulse (!) 104, temperature 98.5 F (36.9 C), temperature source Oral, resp. rate 20, height 6' 2"  (1.88 m), weight 155 lb 14.4 oz (70.7 kg), SpO2 100 %.  LABORATORY DATA: Lab Results  Component Value Date   WBC 5.2 02/19/2017   HGB 9.7 (L) 02/19/2017   HCT 32.9 (L) 02/19/2017   MCV 100.0 02/19/2017   PLT 290 02/19/2017      Chemistry      Component Value Date/Time   NA 138 02/11/2017 0905   K 4.4 02/11/2017 0905   CL 107 05/22/2016 0641   CO2 24 02/11/2017 0905   BUN 9.5 02/11/2017 0905   CREATININE 0.7 02/11/2017 0905      Component Value Date/Time   CALCIUM 9.3 02/11/2017 0905   ALKPHOS 104 02/11/2017 0905   AST 20 02/11/2017 0905   ALT 13 02/11/2017 0905   BILITOT 0.61 02/11/2017 0905       RADIOGRAPHIC STUDIES: Dg Chest 2 View  Result Date: 02/12/2017 CLINICAL DATA:  Cough and congestion. EXAM: CHEST  2 VIEW COMPARISON:  CT 01/19/2017. FINDINGS: Mediastinum and hilar structures stable. Heart size stable. Prominent cavity with adjacent infiltrate right upper lobe is again noted. Similar findings noted on prior CT. No pleural effusion or pneumothorax. No acute bony abnormality. IMPRESSION: Persistent prominent cavity with adjacent infiltrate right upper  lobe again noted. This could be infectious and/or malignant. Similar findings noted on prior CT of 01/19/2017. Electronically Signed   By: Marcello Moores  Register   On: 02/12/2017 10:31   Dg Chest Port 1 View  Result Date: 02/27/2017 CLINICAL DATA:  Status post right upper lobe bronchoscopy EXAM: PORTABLE CHEST 1 VIEW COMPARISON:  Feb 12, 2017 FINDINGS: No pneumothorax. There is increase consolidation in the right upper lobe with volume loss in the right upper lobe compared to recent study. Lungs elsewhere are clear. Heart size and pulmonary vascularity are normal. No adenopathy. There is an old healed fracture of the lateral left fifth rib, stable. IMPRESSION: No pneumothorax. Increase in consolidation with volume loss right upper lobe compared to most recent study. Lungs elsewhere clear. Stable cardiac silhouette. Electronically Signed   By: Lowella Grip III M.D.   On: 02/27/2017 09:17   Dg C-arm Bronchoscopy  Result Date: 02/27/2017 C-ARM BRONCHOSCOPY: Fluoroscopy was utilized by the requesting physician.  No radiographic interpretation.    ASSESSMENT AND PLAN:  This is a very pleasant 61 years old white male with a stage IV non-small cell lung cancer, adenocarcinoma status post 6 cycles of induction systemic chemotherapy with carbo platinum, Alimta and Avastin with partial response. This was followed by maintenance treatment with Alimta and Avastin for 3 cycles and his  treatment is currently on hold secondary to development of cavitary pneumonia in the right upper lobe. The patient is still under treatment for this condition. I discussed with him the option of resuming his treatment with maintenance chemotherapy versus continuous observation and close monitoring. He is still interested in holding his treatment for now. I will see him back for follow-up visit in 2 months for reevaluation with repeat CT scan of the chest, abdomen and pelvis for restaging of his disease. He was advised to call  immediately if he has any concerning symptoms in the interval. The patient voices understanding of current disease status and treatment options and is in agreement with the current care plan. All questions were answered. The patient knows to call the clinic with any problems, questions or concerns. We can certainly see the patient much sooner if necessary.  Disclaimer: This note was dictated with voice recognition software. Similar sounding words can inadvertently be transcribed and may not be corrected upon review.

## 2017-03-05 ENCOUNTER — Telehealth: Payer: Self-pay | Admitting: Pulmonary Disease

## 2017-03-05 NOTE — Telephone Encounter (Signed)
I have called [facility] to initiate the PA for the Voriconazole.  I called [phone].    Review # [id number] Reference ID #  [id number]  Will forward to Margie to follow up on PA

## 2017-03-06 ENCOUNTER — Telehealth: Payer: Self-pay | Admitting: Medical Oncology

## 2017-03-06 NOTE — Telephone Encounter (Signed)
Dr Concepcion Living office working on [place] for vfend.

## 2017-03-06 NOTE — Telephone Encounter (Signed)
Calling to check on status of PA Lelon Frohlich ex-wife calling, he can be reached on cell#   [phone] her # is [phone] the # to pharm in [place] [phone].me

## 2017-03-06 NOTE — Telephone Encounter (Signed)
Spoke with pt. He is aware that we are still waiting to hear back from [employer] on a determination. Will wait PA decision.

## 2017-03-06 NOTE — Telephone Encounter (Signed)
lmtcb x1 for pt's ex-wife, Webb Silversmith.

## 2017-03-06 NOTE — Telephone Encounter (Signed)
Patient returned call, CB [phone].

## 2017-03-09 ENCOUNTER — Other Ambulatory Visit: Payer: Self-pay | Admitting: *Deleted

## 2017-03-09 DIAGNOSIS — J984 Other disorders of lung: Secondary | ICD-10-CM

## 2017-03-09 DIAGNOSIS — Z5111 Encounter for antineoplastic chemotherapy: Secondary | ICD-10-CM

## 2017-03-09 DIAGNOSIS — C3411 Malignant neoplasm of upper lobe, right bronchus or lung: Secondary | ICD-10-CM

## 2017-03-09 DIAGNOSIS — J189 Pneumonia, unspecified organism: Secondary | ICD-10-CM

## 2017-03-09 MED ORDER — OXYCODONE HCL 5 MG PO TABS: 5.0000 mg | ORAL_TABLET | ORAL | 0 refills | Status: DC | PRN

## 2017-03-09 NOTE — Telephone Encounter (Signed)
Initiate appeal please. We are treating mold which is not treated with any of the alternatives

## 2017-03-09 NOTE — Telephone Encounter (Signed)
Contacted [facility] [employer] for follow up. Representative Selena stated the PA was denied on 03/05/17 being patient has not tried and failed two preferred medications. The preferred medications are: clotrimazole troche, fluconazole suspension, griseosulvin, nystatin, or terbinafinb.   PM please advise if you would like to change pt medication or initiate appeal.   Will route to [facility] to follow up.  ID: [id number]

## 2017-03-09 NOTE — Telephone Encounter (Signed)
Pt called for pain med refill, will pick on Wednesday.

## 2017-03-09 NOTE — Telephone Encounter (Signed)
PM please see below message. Thanks.

## 2017-03-10 NOTE — Telephone Encounter (Signed)
Spoke with pt. He is aware that we will be doing an appeal.

## 2017-03-10 NOTE — Telephone Encounter (Signed)
I had spoken with Joellen [place] with  [employer] who states pt will have to initiate appeal. I have re submitted PA with additional information. Will receive a fax within 24-72 hours . Lm to make pt aware.    PA # [id number]

## 2017-03-10 NOTE — Telephone Encounter (Signed)
Pt returning call.Edward Spence ° °

## 2017-03-11 ENCOUNTER — Ambulatory Visit (INDEPENDENT_AMBULATORY_CARE_PROVIDER_SITE_OTHER)
Admission: RE | Admit: 2017-03-11 | Discharge: 2017-03-11 | Disposition: A | Discharge status: Home / Self Care | Payer: Medicaid Other | Source: Ambulatory Visit | Attending: Pulmonary Disease | Admitting: Pulmonary Disease

## 2017-03-11 ENCOUNTER — Encounter: Payer: Self-pay | Admitting: Pulmonary Disease

## 2017-03-11 ENCOUNTER — Other Ambulatory Visit (INDEPENDENT_AMBULATORY_CARE_PROVIDER_SITE_OTHER): Payer: Medicaid Other

## 2017-03-11 ENCOUNTER — Ambulatory Visit (INDEPENDENT_AMBULATORY_CARE_PROVIDER_SITE_OTHER): Payer: Medicaid Other | Admitting: Pulmonary Disease

## 2017-03-11 VITALS — BP 110/66 | HR 100 | Ht 74.0 in | Wt 151.4 lb

## 2017-03-11 DIAGNOSIS — J984 Other disorders of lung: Secondary | ICD-10-CM | POA: Diagnosis not present

## 2017-03-11 DIAGNOSIS — R0602 Shortness of breath: Secondary | ICD-10-CM

## 2017-03-11 DIAGNOSIS — J189 Pneumonia, unspecified organism: Secondary | ICD-10-CM | POA: Diagnosis not present

## 2017-03-11 LAB — CBC WITH DIFFERENTIAL/PLATELET
BASOS PCT: 0.8 % (ref 0.0–3.0)
Basophils Absolute: 0 10*3/uL (ref 0.0–0.1)
EOS PCT: 2 % (ref 0.0–5.0)
Eosinophils Absolute: 0.1 10*3/uL (ref 0.0–0.7)
HCT: 33.3 % — ABNORMAL LOW (ref 39.0–52.0)
HEMOGLOBIN: 10.3 g/dL — AB (ref 13.0–17.0)
LYMPHS ABS: 0.7 10*3/uL (ref 0.7–4.0)
Lymphocytes Relative: 13.5 % (ref 12.0–46.0)
MCHC: 31.1 g/dL (ref 30.0–36.0)
MCV: 90.3 fl (ref 78.0–100.0)
MONO ABS: 0.5 10*3/uL (ref 0.1–1.0)
Monocytes Relative: 10.2 % (ref 3.0–12.0)
Neutro Abs: 3.8 10*3/uL (ref 1.4–7.7)
Neutrophils Relative %: 73.5 % (ref 43.0–77.0)
Platelets: 288 10*3/uL (ref 150.0–400.0)
RBC: 3.68 Mil/uL — ABNORMAL LOW (ref 4.22–5.81)
RDW: 19.2 % — AB (ref 11.5–15.5)
WBC: 5.2 10*3/uL (ref 4.0–10.5)

## 2017-03-11 LAB — SEDIMENTATION RATE: SED RATE: 130 mm/h — AB (ref 0–20)

## 2017-03-11 NOTE — Telephone Encounter (Signed)
[facility] [place] [employer] was contacted for appeal follow up. Representative stated the patient was approved from 03/10/2017-09/06/2017. Being patient had OV today, PM was consulted to see he still wanted patient to have medication as it was not listed on his AVS. PM stated he will contact patient to discuss medication.   Will route to PM as reminder.    PA: [id number]

## 2017-03-11 NOTE — Patient Instructions (Signed)
We will get some blood work today to revaluate the infection Based on these results we will determine if we need to continue the antibiotics Follow up in 2 months

## 2017-03-11 NOTE — Progress Notes (Signed)
Edward Spence    [id number]    11-11-1955  Primary Care Physician:House, [name], FNP  Referring Physician: [name], [place], [place] [address]  Chief complaint:  Follow up for cavitary lung lesion  HPI: History of  Stage IV (T3, N3, M1b) non-small cell lung cancer, adenocarcinoma diagnosed in August 2017 when he presented with right upper lobe lung nodule, mediastinal lymphadenopathy, bone metastasis. He is currently on chemotherapy with altima and avastin,  radiation with partial response. Avastin was discontinued after development of right upper lobe cavitary lesion seen on a follow-up CT on 4/23.  Patient reports no fevers or chills. He has chronic cough with white sputum production mostly in the morning. Denies any dyspnea, fevers, hemoptysis. His chief complaint is severe constipation and pain in the right shoulder and chest when he moves his arm.  Interim History: He underwent bronch for further evaluation of the left upper lobe cavitary lesion. There is no evidence of malignancy and cultures are negative to date. He has been treated with 2 weeks of Levaquin. He still has some cough with sputum production. Denies fevers, chills  Outpatient Encounter Prescriptions as of 03/11/2017  Medication Sig  . albuterol (PROVENTIL HFA;VENTOLIN HFA) 108 (90 Base) MCG/ACT inhaler Inhale 2 puffs into the lungs every 6 (six) hours as needed for wheezing or shortness of breath.  . dexamethasone (DECADRON) 4 MG tablet 4 mg by mouth twice a day the day before, day of and day after the chemotherapy every 3 weeks  . levofloxacin (LEVAQUIN) 750 MG tablet Take 1 tablet (750 mg total) by mouth daily.  Marland Kitchen morphine (MS CONTIN) 30 MG 12 hr tablet Take 1 tablet (30 mg total) by mouth 3 (three) times daily.  Marland Kitchen oxyCODONE (OXY IR/ROXICODONE) 5 MG immediate release tablet Take 1-2 tablets (5-10 mg total) by mouth every 4 (four) hours as needed for severe pain.  Marland Kitchen voriconazole (VFEND)  200 MG tablet Take 1 tablet (200 mg total) by mouth 2 (two) times daily.   No facility-administered encounter medications on file as of 03/11/2017.     Allergies as of 03/11/2017 - Review Complete 03/11/2017  Allergen Reaction Noted  . Penicillins Hives 05/19/2016    Past Medical History:  Diagnosis Date  . Cavitary pneumonia 01/21/2017  . Constipation 07/23/2016  . Encounter for antineoplastic chemotherapy 06/24/2016  . Lung cancer ([place])   . Lung mass 05/19/2016  . Pneumonia    in college    Past Surgical History:  Procedure Laterality Date  . APPENDECTOMY    . VIDEO BRONCHOSCOPY Bilateral 02/27/2017   Procedure: VIDEO BRONCHOSCOPY WITH FLUORO;  Surgeon: Marshell Garfinkel, MD;  Location: [facility];  Service: Cardiopulmonary;  Laterality: Bilateral;  . VIDEO BRONCHOSCOPY WITH ENDOBRONCHIAL ULTRASOUND N/A 05/22/2016   Procedure: VIDEO BRONCHOSCOPY WITH ENDOBRONCHIAL ULTRASOUND;  Surgeon: [name], MD;  Location: [facility] OR;  Service: Thoracic;  Laterality: N/A;    Family History  Problem Relation Age of Onset  . Cancer Mother   . COPD Father   . Heart failure Father     Social History   Social History  . Marital status: Divorced    Spouse name: N/A  . Number of children: N/A  . Years of education: N/A   Occupational History  . Not on file.   Social History Main Topics  . Smoking status: Current Every Day Smoker    Packs/day: 0.25    Years: 40.00  Types: Cigarettes    Last attempt to quit: 04/29/2016  . Smokeless tobacco: Never Used     Comment: 1-5 per day  . Alcohol use No  . Drug use: No  . Sexual activity: Not on file   Other Topics Concern  . Not on file   Social History Narrative  . No narrative on file    Review of systems: Review of Systems  Constitutional: Negative for fever and chills.  HENT: Negative.   Eyes: Negative for blurred vision.  Respiratory: as per HPI  Cardiovascular: Negative for chest pain and palpitations.    Gastrointestinal: Negative for vomiting, diarrhea, blood per rectum. Genitourinary: Negative for dysuria, urgency, frequency and hematuria.  Musculoskeletal: Negative for myalgias, back pain and joint pain.  Skin: Negative for itching and rash.  Neurological: Negative for dizziness, tremors, focal weakness, seizures and loss of consciousness.  Endo/Heme/Allergies: Negative for environmental allergies.  Psychiatric/Behavioral: Negative for depression, suicidal ideas and hallucinations.  All other systems reviewed and are negative.  Physical Exam: Blood pressure 102/70, pulse 99, height _0  (1.88 m), weight 157 lb (71.2 kg), SpO2 99 %. Gen:      No acute distress HEENT:  EOMI, sclera anicteric Neck:     No masses; no thyromegaly Lungs:    Clear to auscultation bilaterally; normal respiratory effort CV:         Regular rate and rhythm; no murmurs Abd:      + bowel sounds; soft, non-tender; no palpable masses, no distension Ext:    No edema; adequate peripheral perfusion Skin:      Warm and dry; no rash Neuro: alert and oriented x 3 Psych: normal mood and affect  Data Reviewed:  CT 05/14/16-right upper lobe mediastinal mass measuring 5.4 x 7.3 x 5.9 cm., Spiculated right upper lobe nodule showing 14 x 12 mm. Right hilar mediastinal lymph nodes CT scan 09/01/16- improvement in mediastinal mass, right upper lobe lung nodule and lymphadenopathy CT chest 10/29/16- continued improvement in mediastinal mass, lymphadenopathy and right upper lobe nodule. CT 01/19/17-  interval development of large right upper lobe cavitary lesion, fullness in the right hilum. Right upper lobe nodule no change from prior  Chest x-ray 02/27/17- Worsening LUL consolidation Chest x-ray 03/11/17- improved left upper lobe consolidation. Persistent cavitary lesion. I had reviewed all images personally.  Bronch with tarnsbronchial biopsies 02/27/17- Path- negative for malignancy BAL WBC 410, 80% neutrophils Cultures-  negative. Fungus pending.   Beta D glucan 02/02/17- 149 Sp cx 02/02/17- Unidentified mold  Assessment:  Evaluation for right upper lobe cavitary lesion Stage IV lung adenocarcinoma Location and timing of the lesion is consistent with the infectious process either bacterial, fungal or mycobacterial. It has appeared in the area of the lung that was not involved by lung cancer.   S/p Bronch with biopsies and BAL which is negative so far. He has been treated with 2 weeks of doxycycline and 2 weeks of Levaquin. I'm not sure this is a fungal infection even though he has positive beta D glucan. Sputum is showing identified mold but the BAL fungal cultures are negative to date. He has been approved for voriconazole but I'll hold off starting it until the final fungal cultures are back. I'll also repeat a beta D glucan and pro calcitonin for further evaluation. He has been referred to ID.  Plan/Recommendations: - Finish levaquin - We will repeat CBC with diff, Pct to determine if you need to be on more prolonged therapy - We will  follow up on the final fungal cultures - Repeat beta D glucan, pro-calcitonin - ID follow-up.  Marshell Garfinkel MD [facility] Pager [phone] 03/11/2017, 1:50 PM  CC: House, [name], FNP

## 2017-03-13 ENCOUNTER — Other Ambulatory Visit: Payer: Medicaid Other

## 2017-03-13 NOTE — Telephone Encounter (Signed)
I am waiting for the repeat beta D glucan to result before deciding if we need to start the voriconazole. I will follow up with the patient. Thanks  Marshell Garfinkel MD

## 2017-03-14 LAB — PROCALCITONIN: Procalcitonin: 0.42 ng/mL — ABNORMAL HIGH (ref <0.10)

## 2017-03-16 MED ORDER — LEVOFLOXACIN 750 MG PO TABS: 750.0000 mg | ORAL_TABLET | Freq: Every day | ORAL | 0 refills | Status: DC

## 2017-03-16 NOTE — Addendum Note (Signed)
Addended by: Desmond Dike C on: 03/16/2017 02:30 PM   Modules accepted: Orders

## 2017-03-17 LAB — FUNGITELL (1-3)-B-D-GLUCAN
(1-3)-B-D-Glucan: <31 pg/mL
INTERPRETATION: NEGATIVE

## 2017-03-19 LAB — AFB CULTURE WITH SMEAR (NOT AT ARMC)

## 2017-03-23 ENCOUNTER — Other Ambulatory Visit: Payer: Self-pay | Admitting: *Deleted

## 2017-03-23 DIAGNOSIS — J189 Pneumonia, unspecified organism: Secondary | ICD-10-CM

## 2017-03-23 DIAGNOSIS — C3411 Malignant neoplasm of upper lobe, right bronchus or lung: Secondary | ICD-10-CM

## 2017-03-23 DIAGNOSIS — J984 Other disorders of lung: Secondary | ICD-10-CM

## 2017-03-23 DIAGNOSIS — Z5111 Encounter for antineoplastic chemotherapy: Secondary | ICD-10-CM

## 2017-03-23 MED ORDER — OXYCODONE HCL 5 MG PO TABS: 5.0000 mg | ORAL_TABLET | ORAL | 0 refills | Status: DC | PRN

## 2017-03-24 NOTE — Telephone Encounter (Signed)
Refill rx placed in rx notebook

## 2017-03-25 ENCOUNTER — Ambulatory Visit: Payer: Medicaid Other | Admitting: Internal Medicine

## 2017-03-25 ENCOUNTER — Other Ambulatory Visit: Payer: Medicaid Other

## 2017-03-25 ENCOUNTER — Ambulatory Visit: Payer: Medicaid Other

## 2017-03-31 LAB — FUNGAL ORGANISM REFLEX

## 2017-03-31 LAB — FUNGUS CULTURE RESULT

## 2017-03-31 LAB — FUNGUS CULTURE WITH STAIN

## 2017-04-02 ENCOUNTER — Telehealth: Payer: Self-pay | Admitting: Pulmonary Disease

## 2017-04-02 DIAGNOSIS — J984 Other disorders of lung: Principal | ICD-10-CM

## 2017-04-02 DIAGNOSIS — J189 Pneumonia, unspecified organism: Secondary | ICD-10-CM

## 2017-04-02 NOTE — Telephone Encounter (Signed)
Spoke with patient.  He wanted to know if he needed another round of Levaquin. He received his lab results on 6/18 and the 2nd round of Levaquin was called in. When asked if he had any symptoms, he said no.   He also wanted to know if he needed to follow up with ID on the 16th. He said that he was told if his sputum culture came back negative, he would not need to go.   PM, please advise. Thanks!

## 2017-04-03 ENCOUNTER — Other Ambulatory Visit: Payer: Self-pay | Admitting: Emergency Medicine

## 2017-04-03 DIAGNOSIS — J984 Other disorders of lung: Secondary | ICD-10-CM

## 2017-04-03 DIAGNOSIS — Z5111 Encounter for antineoplastic chemotherapy: Secondary | ICD-10-CM

## 2017-04-03 DIAGNOSIS — J189 Pneumonia, unspecified organism: Secondary | ICD-10-CM

## 2017-04-03 DIAGNOSIS — C3411 Malignant neoplasm of upper lobe, right bronchus or lung: Secondary | ICD-10-CM

## 2017-04-03 MED ORDER — MORPHINE SULFATE ER 30 MG PO TBCR: 30.0000 mg | EXTENDED_RELEASE_TABLET | Freq: Three times a day (TID) | ORAL | 0 refills | Status: DC

## 2017-04-03 MED ORDER — OXYCODONE HCL 5 MG PO TABS: 5.0000 mg | ORAL_TABLET | ORAL | 0 refills | Status: DC | PRN

## 2017-04-03 NOTE — Telephone Encounter (Signed)
lmomtcb x 2  

## 2017-04-03 NOTE — Telephone Encounter (Signed)
PM please advise. Thanks

## 2017-04-03 NOTE — Telephone Encounter (Signed)
I dont think he will need more antibiotics. Lets get a chest x day (can be done next week). If it is improving then we can cancel the ID appointment.   Please order a chest x ray. I will call him with the results. Thanks  Marshell Garfinkel MD

## 2017-04-03 NOTE — Telephone Encounter (Signed)
Patient returned phone call..ert ° °

## 2017-04-03 NOTE — Telephone Encounter (Signed)
Spoke with patient. He is aware of CXR. Will order CXR. Nothing else needed at time of call.

## 2017-04-06 ENCOUNTER — Ambulatory Visit (INDEPENDENT_AMBULATORY_CARE_PROVIDER_SITE_OTHER)
Admission: RE | Admit: 2017-04-06 | Discharge: 2017-04-06 | Disposition: A | Discharge status: Home / Self Care | Payer: Medicaid Other | Source: Ambulatory Visit | Attending: Pulmonary Disease | Admitting: Pulmonary Disease

## 2017-04-06 DIAGNOSIS — J984 Other disorders of lung: Secondary | ICD-10-CM

## 2017-04-06 DIAGNOSIS — J189 Pneumonia, unspecified organism: Secondary | ICD-10-CM

## 2017-04-08 ENCOUNTER — Telehealth: Payer: Self-pay | Admitting: Pulmonary Disease

## 2017-04-08 NOTE — Telephone Encounter (Signed)
Spoke with pt. He would like the results from his CXR done on 04/06/2017. Pt has an appointment with ID on 04/13/2017 and needs to know if he needs to keep this appointment.  PM - please advise on CXR results. Thanks.

## 2017-04-09 NOTE — Telephone Encounter (Signed)
Called and spoke with patient. I have reviewed the CXR which shows stable tight upper lobe opacities which may be secondary to scarring, the cavitary pna is not very evident. His repeat beta D glucan is negative and final bronch cultures do not show any fungus so I dont think he will need ID to see. Please cancel the ID consult.  He will follow up with Dr. Earlie Server and me in 1-2 months. I believe he has a repeat CT scan ordered by oncology. We will check on this when done.   Marshell Garfinkel MD

## 2017-04-09 NOTE — Telephone Encounter (Addendum)
Called ID office [phone] Cancelled appt for 04/13/17 Pt notified and made aware via detailed message on confirmed voicemail.  Nothing further needed.

## 2017-04-13 ENCOUNTER — Ambulatory Visit: Payer: Medicaid Other | Admitting: Internal Medicine

## 2017-04-13 LAB — ACID FAST CULTURE WITH REFLEXED SENSITIVITIES (MYCOBACTERIA): Acid Fast Culture: NEGATIVE

## 2017-04-13 LAB — ACID FAST CULTURE WITH REFLEXED SENSITIVITIES

## 2017-04-15 ENCOUNTER — Other Ambulatory Visit: Payer: Medicaid Other

## 2017-04-15 ENCOUNTER — Telehealth: Payer: Self-pay | Admitting: Pulmonary Disease

## 2017-04-15 ENCOUNTER — Ambulatory Visit: Payer: Medicaid Other

## 2017-04-15 ENCOUNTER — Ambulatory Visit: Payer: Medicaid Other | Admitting: Internal Medicine

## 2017-04-15 NOTE — Telephone Encounter (Signed)
PM  Please Advise-  Patient called back and stated you spoke with him about the results of his xray.  He states he understood that he did not have the fungal pna that would have required him to see ID if he did but he had an additional question. He wanted to know does he have any type of pna at all.

## 2017-04-16 NOTE — Telephone Encounter (Signed)
I called and answered his questions.  Marshell Garfinkel MD [facility] 04/16/2017, 1:18 PM

## 2017-04-16 NOTE — Telephone Encounter (Signed)
PM called and spoke with pt. Nothing further is needed.

## 2017-04-22 ENCOUNTER — Telehealth: Payer: Self-pay | Admitting: Pulmonary Disease

## 2017-04-22 MED ORDER — ALBUTEROL SULFATE HFA 108 (90 BASE) MCG/ACT IN AERS: 2.0000 | INHALATION_SPRAY | Freq: Four times a day (QID) | RESPIRATORY_TRACT | 2 refills | Status: DC | PRN

## 2017-04-22 NOTE — Telephone Encounter (Signed)
Called pt's pharmacy and they state they never received the rx for the albuterol that was sent on June 1 but they did received the rx for the levaquin. Asked if we re-send rx will pt be able to pick up rx today, they stated he would not but they can have it for him tomorrow. Called pt and informed him of this information and he stated that he would be ok waiting until tomorrow to obtain his rx. He had no additional questions at this time. Rx was re-sent. Nothing further is needed

## 2017-04-30 ENCOUNTER — Other Ambulatory Visit: Payer: Self-pay | Admitting: Medical Oncology

## 2017-04-30 DIAGNOSIS — J189 Pneumonia, unspecified organism: Secondary | ICD-10-CM

## 2017-04-30 DIAGNOSIS — J984 Other disorders of lung: Secondary | ICD-10-CM

## 2017-04-30 DIAGNOSIS — C3411 Malignant neoplasm of upper lobe, right bronchus or lung: Secondary | ICD-10-CM

## 2017-04-30 DIAGNOSIS — Z5111 Encounter for antineoplastic chemotherapy: Secondary | ICD-10-CM

## 2017-04-30 MED ORDER — OXYCODONE HCL 5 MG PO TABS: 5.0000 mg | ORAL_TABLET | ORAL | 0 refills | Status: DC | PRN

## 2017-05-04 ENCOUNTER — Other Ambulatory Visit (HOSPITAL_BASED_OUTPATIENT_CLINIC_OR_DEPARTMENT_OTHER): Payer: Medicaid Other

## 2017-05-04 ENCOUNTER — Ambulatory Visit (HOSPITAL_COMMUNITY)
Admission: RE | Admit: 2017-05-04 | Discharge: 2017-05-04 | Disposition: A | Discharge status: Home / Self Care | Payer: Medicaid Other | Source: Ambulatory Visit | Attending: Internal Medicine | Admitting: Internal Medicine

## 2017-05-04 DIAGNOSIS — M899 Disorder of bone, unspecified: Secondary | ICD-10-CM | POA: Diagnosis not present

## 2017-05-04 DIAGNOSIS — I2510 Atherosclerotic heart disease of native coronary artery without angina pectoris: Secondary | ICD-10-CM | POA: Insufficient documentation

## 2017-05-04 DIAGNOSIS — I313 Pericardial effusion (noninflammatory): Secondary | ICD-10-CM | POA: Insufficient documentation

## 2017-05-04 DIAGNOSIS — J479 Bronchiectasis, uncomplicated: Secondary | ICD-10-CM | POA: Insufficient documentation

## 2017-05-04 DIAGNOSIS — I700 Atherosclerosis of aorta: Secondary | ICD-10-CM | POA: Insufficient documentation

## 2017-05-04 DIAGNOSIS — C3411 Malignant neoplasm of upper lobe, right bronchus or lung: Secondary | ICD-10-CM | POA: Diagnosis present

## 2017-05-04 LAB — CBC WITH DIFFERENTIAL/PLATELET
BASO%: 0.6 % (ref 0.0–2.0)
Basophils Absolute: 0 10*3/uL (ref 0.0–0.1)
EOS%: 2.6 % (ref 0.0–7.0)
Eosinophils Absolute: 0.1 10*3/uL (ref 0.0–0.5)
HCT: 37.4 % — ABNORMAL LOW (ref 38.4–49.9)
HEMOGLOBIN: 11.7 g/dL — AB (ref 13.0–17.1)
LYMPH#: 0.9 10*3/uL (ref 0.9–3.3)
LYMPH%: 19.4 % (ref 14.0–49.0)
MCH: 26.9 pg — ABNORMAL LOW (ref 27.2–33.4)
MCHC: 31.2 g/dL — ABNORMAL LOW (ref 32.0–36.0)
MCV: 86 fL (ref 79.3–98.0)
MONO#: 0.5 10*3/uL (ref 0.1–0.9)
MONO%: 10.3 % (ref 0.0–14.0)
NEUT#: 3.1 10*3/uL (ref 1.5–6.5)
NEUT%: 67.1 % (ref 39.0–75.0)
PLATELETS: 189 10*3/uL (ref 140–400)
RBC: 4.35 10*6/uL (ref 4.20–5.82)
RDW: 19.4 % — AB (ref 11.0–14.6)
WBC: 4.7 10*3/uL (ref 4.0–10.3)

## 2017-05-04 LAB — COMPREHENSIVE METABOLIC PANEL
ALBUMIN: 3.4 g/dL — AB (ref 3.5–5.0)
ALK PHOS: 61 U/L (ref 40–150)
ALT: 6 U/L (ref 0–55)
ANION GAP: 8 meq/L (ref 3–11)
AST: 12 U/L (ref 5–34)
BILIRUBIN TOTAL: 0.3 mg/dL (ref 0.20–1.20)
BUN: 16.8 mg/dL (ref 7.0–26.0)
CALCIUM: 9.6 mg/dL (ref 8.4–10.4)
CO2: 26 meq/L (ref 22–29)
CREATININE: 0.7 mg/dL (ref 0.7–1.3)
Chloride: 106 mEq/L (ref 98–109)
Glucose: 88 mg/dl (ref 70–140)
Potassium: 4.6 mEq/L (ref 3.5–5.1)
Sodium: 140 mEq/L (ref 136–145)
TOTAL PROTEIN: 7.6 g/dL (ref 6.4–8.3)

## 2017-05-04 MED ORDER — IOPAMIDOL (ISOVUE-300) INJECTION 61%
INTRAVENOUS | Status: AC
  Administered 2017-05-04: 100 mL
  Filled 2017-05-04: qty 100

## 2017-05-06 ENCOUNTER — Ambulatory Visit (HOSPITAL_BASED_OUTPATIENT_CLINIC_OR_DEPARTMENT_OTHER): Payer: Medicaid Other | Admitting: Internal Medicine

## 2017-05-06 ENCOUNTER — Encounter: Payer: Self-pay | Admitting: Internal Medicine

## 2017-05-06 VITALS — BP 120/70 | HR 103 | Temp 98.4°F | Resp 20 | Ht 74.0 in | Wt 156.4 lb

## 2017-05-06 DIAGNOSIS — J984 Other disorders of lung: Secondary | ICD-10-CM

## 2017-05-06 DIAGNOSIS — C7951 Secondary malignant neoplasm of bone: Secondary | ICD-10-CM | POA: Diagnosis not present

## 2017-05-06 DIAGNOSIS — C3411 Malignant neoplasm of upper lobe, right bronchus or lung: Secondary | ICD-10-CM | POA: Diagnosis present

## 2017-05-06 DIAGNOSIS — J189 Pneumonia, unspecified organism: Secondary | ICD-10-CM | POA: Diagnosis not present

## 2017-05-06 NOTE — Progress Notes (Signed)
[facility] [phone]   [phone]  OFFICE PROGRESS NOTE  [name], FNP [address] [place] [address]  DIAGNOSIS: Stage IV (T3, N3, M1b) non-small cell lung cancer, adenocarcinoma diagnosed in August 2017 and presented with right upper lobe lung nodule as well as large right superior mediastinal and right paratracheal lymphadenopathy and bone metastases in the right iliac and L5 vertebral body.  GUARDANT 360: Negative for EGFR, ALK, ROS 1, BRAF mutations but showed amplification of EGFR and MET  PRIOR THERAPY: 1) Palliative systemic chemotherapy to the right superior mediastinal and right paratracheal lymphadenopathy under the care of Dr. Lisbeth Renshaw. 2) Systemic chemotherapy with carboplatin for AUC of 5, Alimta 500 MG/M2 and Avastin 15 MG/KG every 3 weeks is status post 6 cycles. First cycle was given on 07/28/2016.  3) Maintenance systemic chemotherapy with Alimta 500 MG/M2 and Avastin 15 MG/KG every 3 weeks. First dose 11/19/2016. Status post 3 cycles. Avastin was discontinued after cycle #3 secondary to development of right upper lobe cavitary lesion. Treatment is currently on hold secondary to the cavitary lesion in the right upper lobe.   CURRENT THERAPY: Observation.   INTERVAL HISTORY: Edward Spence 61 y.o. male returns to the clinic for follow-up visit accompanied by his girlfriend. The patient is feeling fine today with no specific complaints. He was able tomorrow his yard yesterday. He denied having any fatigue or weakness. He denied having any chest pain, shortness breath, cough or hemoptysis. He has no fever or chills. He has no nausea, vomiting, diarrhea or constipation. He gained 5 pounds since his last visit. He had repeat CT scan of the chest, abdomen and pelvis performed recently and he is here today for evaluation and discussion of his scan results.  MEDICAL HISTORY: Past Medical History:  Diagnosis Date  . Cavitary  pneumonia 01/21/2017  . Constipation 07/23/2016  . Encounter for antineoplastic chemotherapy 06/24/2016  . Lung cancer ([place])   . Lung mass 05/19/2016  . Pneumonia    in college    ALLERGIES:  is allergic to penicillins.  MEDICATIONS:  Current Outpatient Prescriptions  Medication Sig Dispense Refill  . albuterol (PROVENTIL HFA;VENTOLIN HFA) 108 (90 Base) MCG/ACT inhaler Inhale 2 puffs into the lungs every 6 (six) hours as needed for wheezing or shortness of breath. 1 Inhaler 2  . morphine (MS CONTIN) 30 MG 12 hr tablet Take 1 tablet (30 mg total) by mouth 3 (three) times daily. 90 tablet 0  . oxyCODONE (OXY IR/ROXICODONE) 5 MG immediate release tablet Take 1-2 tablets (5-10 mg total) by mouth every 4 (four) hours as needed for severe pain. 45 tablet 0  . dexamethasone (DECADRON) 4 MG tablet 4 mg by mouth twice a day the day before, day of and day after the chemotherapy every 3 weeks (Patient not taking: Reported on 05/06/2017) 40 tablet 1   No current facility-administered medications for this visit.     SURGICAL HISTORY:  Past Surgical History:  Procedure Laterality Date  . APPENDECTOMY    . VIDEO BRONCHOSCOPY Bilateral 02/27/2017   Procedure: VIDEO BRONCHOSCOPY WITH FLUORO;  Surgeon: Marshell Garfinkel, MD;  Location: [facility];  Service: Cardiopulmonary;  Laterality: Bilateral;  . VIDEO BRONCHOSCOPY WITH ENDOBRONCHIAL ULTRASOUND N/A 05/22/2016   Procedure: VIDEO BRONCHOSCOPY WITH ENDOBRONCHIAL ULTRASOUND;  Surgeon: [name], MD;  Location: [facility];  Service: Thoracic;  Laterality: N/A;    REVIEW OF SYSTEMS:  A comprehensive review of systems was negative.  PHYSICAL EXAMINATION: General appearance: alert, cooperative and no distress Head: Normocephalic, without obvious abnormality, atraumatic Neck: no adenopathy, no JVD, supple, symmetrical, trachea midline and thyroid not enlarged, symmetric, no tenderness/mass/nodules Lymph nodes: Cervical, supraclavicular, and axillary  nodes normal. Resp: clear to auscultation bilaterally Back: symmetric, no curvature. ROM normal. No CVA tenderness. Cardio: regular rate and rhythm, S1, S2 normal, no murmur, click, rub or gallop GI: soft, non-tender; bowel sounds normal; no masses,  no organomegaly Extremities: extremities normal, atraumatic, no cyanosis or edema  ECOG PERFORMANCE STATUS: 1 - Symptomatic but completely ambulatory  Blood pressure 120/70, pulse (!) 103, temperature 98.4 F (36.9 C), temperature source Oral, resp. rate 20, height 6' 2"  (1.88 m), weight 156 lb 6.4 oz (70.9 kg), SpO2 100 %.  LABORATORY DATA: Lab Results  Component Value Date   WBC 4.7 05/04/2017   HGB 11.7 (L) 05/04/2017   HCT 37.4 (L) 05/04/2017   MCV 86.0 05/04/2017   PLT 189 05/04/2017      Chemistry      Component Value Date/Time   NA 140 05/04/2017 0802   K 4.6 05/04/2017 0802   CL 107 05/22/2016 0641   CO2 26 05/04/2017 0802   BUN 16.8 05/04/2017 0802   CREATININE 0.7 05/04/2017 0802      Component Value Date/Time   CALCIUM 9.6 05/04/2017 0802   ALKPHOS 61 05/04/2017 0802   AST 12 05/04/2017 0802   ALT 6 05/04/2017 0802   BILITOT 0.30 05/04/2017 0802       RADIOGRAPHIC STUDIES: Ct Chest W Contrast  Result Date: 05/04/2017 CLINICAL DATA:  Right lung cancer with chemotherapy and radiation therapy. Persistent weight loss and chronic shortness of breath. EXAM: CT CHEST, ABDOMEN, AND PELVIS WITH CONTRAST TECHNIQUE: Multidetector CT imaging of the chest, abdomen and pelvis was performed following the standard protocol during bolus administration of intravenous contrast. CONTRAST:  166m ISOVUE-300 IOPAMIDOL (ISOVUE-300) INJECTION 61% COMPARISON:  01/19/2017. FINDINGS: CT CHEST FINDINGS Cardiovascular: Coronary artery calcification. Heart size normal. Small to moderate pericardial effusion, new. Mediastinum/Nodes: Low right paratracheal lymph node, 10 mm, stable. No hilar or axillary adenopathy. Ill-defined soft tissue along  the right paratracheal region, as before. Esophagus is grossly unremarkable. Lungs/Pleura: A cavitary structure in the right upper lobe has decreased in size with increasing surrounding confluent soft tissue, bronchiectasis, volume loss and architectural distortion. Mucoid impaction in both lower lobes. 3 mm subpleural nodular density in the left lower lobe (series 4, image 102), stable. No pleural fluid. Adherent debris in the left mainstem bronchus. Airway is otherwise unremarkable. Musculoskeletal: Degenerative changes in the spine. No worrisome lytic or sclerotic lesions. Mild sclerosis in the right first rib may be radiation induced. CT ABDOMEN PELVIS FINDINGS Hepatobiliary: Reflux of contrast into the hepatic veins. Liver and gallbladder are otherwise unremarkable. Mild intrahepatic and extrahepatic biliary ductal dilatation, stable. Pancreas: Negative. Spleen: Negative. Adrenals/Urinary Tract: Adrenal glands are unremarkable. Subcentimeter low-attenuation lesions in the kidneys are too small to characterize but statistically, cysts are most likely. Ureters are decompressed. Bladder is grossly unremarkable. Stomach/Bowel: Stomach, small bowel and appendix are unremarkable. Stool is seen throughout the colon. Vascular/Lymphatic: Atherosclerotic calcification of the arterial vasculature without abdominal aortic aneurysm. No pathologically enlarged lymph nodes. Reproductive: Prostate is mildly prominent. Other: No free fluid.  Mesenteries and peritoneum are unremarkable. Musculoskeletal: A sclerotic lesion in the medial right iliac wing, adjacent to the sacroiliac joint, has a central area of internal lucency, measuring 7 cm, stable. Small sclerotic lesion in the L5 vertebral body, also stable.  Degenerative changes in the spine. IMPRESSION: 1. Cavitary structure in the right upper lobe has decreased in size with increasing surrounding confluent soft tissue, bronchiectasis, volume loss and architectural  distortion, most indicative of evolving radiation treatment changes. An associated resolving or resolved cavitary right upper lobe pneumonia is also considered. 2. Sclerotic lesions in L5 and right iliac wing, stable. No additional evidence of metastatic disease. 3. Small to moderate pericardial effusion, new. 4. Aortic atherosclerosis (ICD10-170.0). Coronary artery calcification. Electronically Signed   By: Lorin Picket M.D.   On: 05/04/2017 10:29   Ct Abdomen Pelvis W Contrast  Result Date: 05/04/2017 CLINICAL DATA:  Right lung cancer with chemotherapy and radiation therapy. Persistent weight loss and chronic shortness of breath. EXAM: CT CHEST, ABDOMEN, AND PELVIS WITH CONTRAST TECHNIQUE: Multidetector CT imaging of the chest, abdomen and pelvis was performed following the standard protocol during bolus administration of intravenous contrast. CONTRAST:  162m ISOVUE-300 IOPAMIDOL (ISOVUE-300) INJECTION 61% COMPARISON:  01/19/2017. FINDINGS: CT CHEST FINDINGS Cardiovascular: Coronary artery calcification. Heart size normal. Small to moderate pericardial effusion, new. Mediastinum/Nodes: Low right paratracheal lymph node, 10 mm, stable. No hilar or axillary adenopathy. Ill-defined soft tissue along the right paratracheal region, as before. Esophagus is grossly unremarkable. Lungs/Pleura: A cavitary structure in the right upper lobe has decreased in size with increasing surrounding confluent soft tissue, bronchiectasis, volume loss and architectural distortion. Mucoid impaction in both lower lobes. 3 mm subpleural nodular density in the left lower lobe (series 4, image 102), stable. No pleural fluid. Adherent debris in the left mainstem bronchus. Airway is otherwise unremarkable. Musculoskeletal: Degenerative changes in the spine. No worrisome lytic or sclerotic lesions. Mild sclerosis in the right first rib may be radiation induced. CT ABDOMEN PELVIS FINDINGS Hepatobiliary: Reflux of contrast into the hepatic  veins. Liver and gallbladder are otherwise unremarkable. Mild intrahepatic and extrahepatic biliary ductal dilatation, stable. Pancreas: Negative. Spleen: Negative. Adrenals/Urinary Tract: Adrenal glands are unremarkable. Subcentimeter low-attenuation lesions in the kidneys are too small to characterize but statistically, cysts are most likely. Ureters are decompressed. Bladder is grossly unremarkable. Stomach/Bowel: Stomach, small bowel and appendix are unremarkable. Stool is seen throughout the colon. Vascular/Lymphatic: Atherosclerotic calcification of the arterial vasculature without abdominal aortic aneurysm. No pathologically enlarged lymph nodes. Reproductive: Prostate is mildly prominent. Other: No free fluid.  Mesenteries and peritoneum are unremarkable. Musculoskeletal: A sclerotic lesion in the medial right iliac wing, adjacent to the sacroiliac joint, has a central area of internal lucency, measuring 7 cm, stable. Small sclerotic lesion in the L5 vertebral body, also stable. Degenerative changes in the spine. IMPRESSION: 1. Cavitary structure in the right upper lobe has decreased in size with increasing surrounding confluent soft tissue, bronchiectasis, volume loss and architectural distortion, most indicative of evolving radiation treatment changes. An associated resolving or resolved cavitary right upper lobe pneumonia is also considered. 2. Sclerotic lesions in L5 and right iliac wing, stable. No additional evidence of metastatic disease. 3. Small to moderate pericardial effusion, new. 4. Aortic atherosclerosis (ICD10-170.0). Coronary artery calcification. Electronically Signed   By: MLorin PicketM.D.   On: 05/04/2017 10:29    ASSESSMENT AND PLAN:  This is a very pleasant 61years old white male with a stage IV non-small cell lung cancer, adenocarcinoma status post 6 cycles of induction systemic chemotherapy with carbo platinum, Alimta and Avastin with partial response. This was followed by  maintenance treatment with Alimta and Avastin for 3 cycles and his treatment was discontinued secondary to development of cavitary pneumonia in the  right upper lobe. The patient is currently on observation. Repeat CT scan of the chest, abdomen and pelvis showed no evidence for disease progression and the patient has improvement of the cavitary structure in the right upper lobe. I discussed the scan results with the patient and his girlfriend and recommended for him to continue on observation with repeat CT scan of the chest, abdomen and pelvis in 3 months for restaging of his disease. He was advised to call immediately if he has any concerning symptoms in the interval. The patient voices understanding of current disease status and treatment options and is in agreement with the current care plan. All questions were answered. The patient knows to call the clinic with any problems, questions or concerns. We can certainly see the patient much sooner if necessary.  Disclaimer: This note was dictated with voice recognition software. Similar sounding words can inadvertently be transcribed and may not be corrected upon review.

## 2017-05-06 NOTE — Patient Instructions (Signed)
Steps to Quit Smoking Smoking tobacco can be bad for your health. It can also affect almost every organ in your body. Smoking puts you and people around you at risk for many serious long-lasting (chronic) diseases. Quitting smoking is hard, but it is one of the best things that you can do for your health. It is never too late to quit. What are the benefits of quitting smoking? When you quit smoking, you lower your risk for getting serious diseases and conditions. They can include:  Lung cancer or lung disease.  Heart disease.  Stroke.  Heart attack.  Not being able to have children (infertility).  Weak bones (osteoporosis) and broken bones (fractures).  If you have coughing, wheezing, and shortness of breath, those symptoms may get better when you quit. You may also get sick less often. If you are pregnant, quitting smoking can help to lower your chances of having a baby of low birth weight. What can I do to help me quit smoking? Talk with your doctor about what can help you quit smoking. Some things you can do (strategies) include:  Quitting smoking totally, instead of slowly cutting back how much you smoke over a period of time.  Going to in-person counseling. You are more likely to quit if you go to many counseling sessions.  Using resources and support systems, such as: ? Online chats with a counselor. ? Phone quitlines. ? Printed self-help materials. ? Support groups or group counseling. ? Text messaging programs. ? Mobile phone apps or applications.  Taking medicines. Some of these medicines may have nicotine in them. If you are pregnant or breastfeeding, do not take any medicines to quit smoking unless your doctor says it is okay. Talk with your doctor about counseling or other things that can help you.  Talk with your doctor about using more than one strategy at the same time, such as taking medicines while you are also going to in-person counseling. This can help make  quitting easier. What things can I do to make it easier to quit? Quitting smoking might feel very hard at first, but there is a lot that you can do to make it easier. Take these steps:  Talk to your family and friends. Ask them to support and encourage you.  Call phone quitlines, reach out to support groups, or work with a counselor.  Ask people who smoke to not smoke around you.  Avoid places that make you want (trigger) to smoke, such as: ? Bars. ? Parties. ? Smoke-break areas at work.  Spend time with people who do not smoke.  Lower the stress in your life. Stress can make you want to smoke. Try these things to help your stress: ? Getting regular exercise. ? Deep-breathing exercises. ? Yoga. ? Meditating. ? Doing a body scan. To do this, close your eyes, focus on one area of your body at a time from head to toe, and notice which parts of your body are tense. Try to relax the muscles in those areas.  Download or buy apps on your mobile phone or tablet that can help you stick to your quit plan. There are many free apps, such as QuitGuide from the CDC (Centers for Disease Control and Prevention). You can find more support from smokefree.gov and other websites.  This information is not intended to replace advice given to you by your health care provider. Make sure you discuss any questions you have with your health care provider. Document Released: 07/12/2009 Document   Revised: 05/13/2016 Document Reviewed: 01/30/2015 Elsevier Interactive Patient Education  2018 Elsevier Inc.  

## 2017-05-11 ENCOUNTER — Telehealth: Payer: Self-pay | Admitting: Internal Medicine

## 2017-05-11 NOTE — Telephone Encounter (Signed)
Called patient left message regarding November appt. Patient to call if he has any questions.

## 2017-05-26 ENCOUNTER — Telehealth: Payer: Self-pay | Admitting: Pulmonary Disease

## 2017-05-26 ENCOUNTER — Other Ambulatory Visit: Payer: Self-pay | Admitting: Medical Oncology

## 2017-05-26 ENCOUNTER — Telehealth: Payer: Self-pay

## 2017-05-26 DIAGNOSIS — Z5111 Encounter for antineoplastic chemotherapy: Secondary | ICD-10-CM

## 2017-05-26 DIAGNOSIS — J984 Other disorders of lung: Secondary | ICD-10-CM

## 2017-05-26 DIAGNOSIS — C3411 Malignant neoplasm of upper lobe, right bronchus or lung: Secondary | ICD-10-CM

## 2017-05-26 DIAGNOSIS — J189 Pneumonia, unspecified organism: Secondary | ICD-10-CM

## 2017-05-26 MED ORDER — MORPHINE SULFATE ER 30 MG PO TBCR: 30.0000 mg | EXTENDED_RELEASE_TABLET | Freq: Three times a day (TID) | ORAL | 0 refills | Status: DC

## 2017-05-26 MED ORDER — OXYCODONE HCL 5 MG PO TABS: 5.0000 mg | ORAL_TABLET | ORAL | 0 refills | Status: DC | PRN

## 2017-05-26 NOTE — Telephone Encounter (Signed)
Patient stated he will keep appointment on 05/28/2017. Nothing further is needed.

## 2017-05-26 NOTE — Telephone Encounter (Signed)
lmtcb X1 for pt. Per last ov pt was to return in 2 months- pt should keep appt.

## 2017-05-26 NOTE — Telephone Encounter (Signed)
Pt called for oxycodone and MS contin refills.

## 2017-05-28 ENCOUNTER — Ambulatory Visit (INDEPENDENT_AMBULATORY_CARE_PROVIDER_SITE_OTHER): Payer: Medicaid Other | Admitting: Pulmonary Disease

## 2017-05-28 ENCOUNTER — Encounter: Payer: Self-pay | Admitting: Pulmonary Disease

## 2017-05-28 VITALS — BP 110/64 | HR 98 | Ht 75.0 in | Wt 158.0 lb

## 2017-05-28 DIAGNOSIS — C3411 Malignant neoplasm of upper lobe, right bronchus or lung: Secondary | ICD-10-CM

## 2017-05-28 DIAGNOSIS — J984 Other disorders of lung: Secondary | ICD-10-CM | POA: Diagnosis not present

## 2017-05-28 DIAGNOSIS — J189 Pneumonia, unspecified organism: Secondary | ICD-10-CM

## 2017-05-28 DIAGNOSIS — R0602 Shortness of breath: Secondary | ICD-10-CM

## 2017-05-28 MED ORDER — FLUTTER DEVI: 0 refills | Status: AC

## 2017-05-28 NOTE — Patient Instructions (Signed)
Continue using the albuterol rescue inhaler as needed next and we'll get spirometry today for baseline evaluation of a lung function  Follow up in 6 months

## 2017-05-28 NOTE — Progress Notes (Signed)
Edward Spence    [id number]    1956-07-02  Primary Care Physician:House, [name], FNP  Referring Physician: [name], [address] [place], [place] [address]  Chief complaint:  Follow up for cavitary pneumonia s/p bronch on 02/27/17 H/O NSCLC  HPI: 61 y/O with Stage IV (T3, N3, M1b) non-small cell lung cancer, adenocarcinoma diagnosed in August 2017 when he presented with right upper lobe lung nodule, mediastinal lymphadenopathy, bone metastasis. He was on chemotherapy with altima and avastin,  radiation with partial response. Avastin was discontinued after development of right upper lobe cavitary lesion seen on a follow-up CT on 4/23. He underwent bronch on 02/27/17 for further evaluation of the left upper lobe cavitary lesion. There is no evidence of malignancy and cultures are negative to date. He has been treated with prolonged course of antibiotics.   Interim History: Edward Spence is here for follow-up of his cavitary lung lesion. He had a follow-up CT scan this month which shows decrease in size of the right upper lobe cavitary lesion with bronchiectasis and fibrotic changes. He has some chronic cough with sputum production. He is using just albuterol which helps with the dyspnea on exertion.  Outpatient Encounter Prescriptions as of 05/28/2017  Medication Sig  . albuterol (PROVENTIL HFA;VENTOLIN HFA) 108 (90 Base) MCG/ACT inhaler Inhale 2 puffs into the lungs every 6 (six) hours as needed for wheezing or shortness of breath.  . dexamethasone (DECADRON) 4 MG tablet 4 mg by mouth twice a day the day before, day of and day after the chemotherapy every 3 weeks  . morphine (MS CONTIN) 30 MG 12 hr tablet Take 1 tablet (30 mg total) by mouth 3 (three) times daily.  Marland Kitchen oxyCODONE (OXY IR/ROXICODONE) 5 MG immediate release tablet Take 1-2 tablets (5-10 mg total) by mouth every 4 (four) hours as needed for severe pain.   No facility-administered encounter medications on file as of  05/28/2017.     Allergies as of 05/28/2017 - Review Complete 05/28/2017  Allergen Reaction Noted  . Penicillins Hives 05/19/2016    Past Medical History:  Diagnosis Date  . Cavitary pneumonia 01/21/2017  . Constipation 07/23/2016  . Encounter for antineoplastic chemotherapy 06/24/2016  . Lung cancer ([place])   . Lung mass 05/19/2016  . Pneumonia    in college    Past Surgical History:  Procedure Laterality Date  . APPENDECTOMY    . VIDEO BRONCHOSCOPY Bilateral 02/27/2017   Procedure: VIDEO BRONCHOSCOPY WITH FLUORO;  Surgeon: Marshell Garfinkel, MD;  Location: [facility];  Service: Cardiopulmonary;  Laterality: Bilateral;  . VIDEO BRONCHOSCOPY WITH ENDOBRONCHIAL ULTRASOUND N/A 05/22/2016   Procedure: VIDEO BRONCHOSCOPY WITH ENDOBRONCHIAL ULTRASOUND;  Surgeon: [name], MD;  [facility];  Service: Thoracic;  Laterality: N/A;    Family History  Problem Relation Age of Onset  . Cancer Mother   . COPD Father   . Heart failure Father     Social History   Social History  . Marital status: Divorced    Spouse name: N/A  . Number of children: N/A  . Years of education: N/A   Occupational History  . Not on file.   Social History Main Topics  . Smoking status: Current Every Day Smoker    Packs/day: 0.25    Years: 40.00    Types: Cigarettes    Last attempt to quit: 04/29/2016  . Smokeless tobacco: Never Used     Comment: 1-5 per day  .  Alcohol use No  . Drug use: No  . Sexual activity: Not on file   Other Topics Concern  . Not on file   Social History Narrative  . No narrative on file   Review of systems: Review of Systems  Constitutional: Negative for fever and chills.  HENT: Negative.   Eyes: Negative for blurred vision.  Respiratory: as per HPI  Cardiovascular: Negative for chest pain and palpitations.  Gastrointestinal: Negative for vomiting, diarrhea, blood per rectum. Genitourinary: Negative for dysuria, urgency, frequency and hematuria.    Musculoskeletal: Negative for myalgias, back pain and joint pain.  Skin: Negative for itching and rash.  Neurological: Negative for dizziness, tremors, focal weakness, seizures and loss of consciousness.  Endo/Heme/Allergies: Negative for environmental allergies.  Psychiatric/Behavioral: Negative for depression, suicidal ideas and hallucinations.  All other systems reviewed and are negative.  Physical Exam: Blood pressure 110/64, pulse 98, height _0  (1.905 m), weight 158 lb (71.7 kg), SpO2 100 %. Gen:      No acute distress HEENT:  EOMI, sclera anicteric Neck:     No masses; no thyromegaly Lungs:    Clear to auscultation bilaterally; normal respiratory effort CV:         Regular rate and rhythm; no murmurs Abd:      + bowel sounds; soft, non-tender; no palpable masses, no distension Ext:    No edema; adequate peripheral perfusion Skin:      Warm and dry; no rash Neuro: alert and oriented x 3 Psych: normal mood and affect  Data Reviewed:  CT 05/14/16-right upper lobe mediastinal mass measuring 5.4 x 7.3 x 5.9 cm., Spiculated right upper lobe nodule showing 14 x 12 mm. Right hilar mediastinal lymph nodes CT scan 09/01/16- improvement in mediastinal mass, right upper lobe lung nodule and lymphadenopathy CT chest 10/29/16- continued improvement in mediastinal mass, lymphadenopathy and right upper lobe nodule. CT 01/19/17-  interval development of large right upper lobe cavitary lesion, fullness in the right hilum. Right upper lobe nodule no change from prior  Chest x-ray 02/27/17- Worsening LUL consolidation Chest x-ray 03/11/17- improved left upper lobe consolidation. Persistent cavitary lesion. CT 05/04/17-   I had reviewed all images personally.  Bronch with tarnsbronchial biopsies 02/27/17- Path- negative for malignancy BAL WBC 410, 80% neutrophils Cultures- negative. Fungus negative  Beta D glucan 02/02/17- 149 Beta D glucan 02/1317- <31 Sp cx 02/02/17- Unidentified mold    Assessment:   Evaluation for right upper lobe cavitary lesion Stage IV lung adenocarcinoma Location and timing of the lesion is consistent with the infectious process likely bacterial. He appears to have responded to antibiotic treatment. There was concern for fungal infection given elevated beta D glucan but fungal cultures are negative and repeat beta D glucan is normal hence antifungal therapy was deferred.   I'll give him a flutter valve for clearance of secretions as he has some residual bronchiectasis in that area with postinflammatory scarring. He has a follow-up CT of the chest ordered for later this year.  COPD Chronic bronchitis He likely has COPD, chronic bronchitis from smoking history and symptoms of daily cough with sputum. We discussed starting him on long-term inhalers with LABA, LAMA but he would prefer to wait for now. Get spirometry today for baseline eval.  Plan/Recommendations: - Spirometry - Flutter valve - Continue albuterol inh  Marshell Garfinkel MD [facility] Pager [phone] 05/28/2017, 4:12 PM  CC: House, [name], FNP

## 2017-07-06 ENCOUNTER — Other Ambulatory Visit: Payer: Self-pay | Admitting: Medical Oncology

## 2017-07-06 ENCOUNTER — Telehealth: Payer: Self-pay

## 2017-07-06 DIAGNOSIS — J984 Other disorders of lung: Secondary | ICD-10-CM

## 2017-07-06 DIAGNOSIS — C3411 Malignant neoplasm of upper lobe, right bronchus or lung: Secondary | ICD-10-CM

## 2017-07-06 DIAGNOSIS — J189 Pneumonia, unspecified organism: Secondary | ICD-10-CM

## 2017-07-06 DIAGNOSIS — Z5111 Encounter for antineoplastic chemotherapy: Secondary | ICD-10-CM

## 2017-07-06 MED ORDER — MORPHINE SULFATE ER 30 MG PO TBCR: 30.0000 mg | EXTENDED_RELEASE_TABLET | Freq: Three times a day (TID) | ORAL | 0 refills | Status: DC

## 2017-07-06 MED ORDER — OXYCODONE HCL 5 MG PO TABS: 5.0000 mg | ORAL_TABLET | ORAL | 0 refills | Status: DC | PRN

## 2017-07-06 NOTE — Telephone Encounter (Signed)
Pt called for oxycodone and morphine refills

## 2017-07-06 NOTE — Telephone Encounter (Addendum)
Per Julien Nordmann , pt to see Cyril Mourning this week to evaluate new pain . Marland Kitchen Pt will pick up ms contin and oxycodone refill at this appt. Rx locked in cabinet in pod 3

## 2017-07-06 NOTE — Telephone Encounter (Addendum)
Pt notified to pick up rx. He reports new onset of right hip pain x 2 weeks. He is taking ms contin 60 mg /day and oxycodone 1-12 bid. Reported to [facility].

## 2017-07-07 ENCOUNTER — Telehealth: Payer: Self-pay | Admitting: Internal Medicine

## 2017-07-07 NOTE — Telephone Encounter (Signed)
Scheduled appt per 10/9 sch message - patient is aware of appt date and time.

## 2017-07-10 ENCOUNTER — Encounter: Payer: Self-pay | Admitting: Oncology

## 2017-07-10 ENCOUNTER — Other Ambulatory Visit (HOSPITAL_BASED_OUTPATIENT_CLINIC_OR_DEPARTMENT_OTHER): Payer: Medicaid Other

## 2017-07-10 ENCOUNTER — Ambulatory Visit (HOSPITAL_BASED_OUTPATIENT_CLINIC_OR_DEPARTMENT_OTHER): Payer: Medicaid Other | Admitting: Oncology

## 2017-07-10 VITALS — BP 113/63 | HR 99 | Temp 98.6°F | Resp 18 | Ht 75.0 in | Wt 160.4 lb

## 2017-07-10 DIAGNOSIS — C3411 Malignant neoplasm of upper lobe, right bronchus or lung: Secondary | ICD-10-CM | POA: Diagnosis present

## 2017-07-10 DIAGNOSIS — M25551 Pain in right hip: Secondary | ICD-10-CM

## 2017-07-10 DIAGNOSIS — M545 Low back pain: Secondary | ICD-10-CM

## 2017-07-10 DIAGNOSIS — J189 Pneumonia, unspecified organism: Secondary | ICD-10-CM

## 2017-07-10 DIAGNOSIS — Z5111 Encounter for antineoplastic chemotherapy: Secondary | ICD-10-CM

## 2017-07-10 DIAGNOSIS — J984 Other disorders of lung: Secondary | ICD-10-CM

## 2017-07-10 DIAGNOSIS — C7951 Secondary malignant neoplasm of bone: Secondary | ICD-10-CM

## 2017-07-10 LAB — COMPREHENSIVE METABOLIC PANEL
AST: 14 U/L (ref 5–34)
Albumin: 3.7 g/dL (ref 3.5–5.0)
Alkaline Phosphatase: 68 U/L (ref 40–150)
Anion Gap: 10 mEq/L (ref 3–11)
BUN: 10.2 mg/dL (ref 7.0–26.0)
CHLORIDE: 105 meq/L (ref 98–109)
CO2: 24 meq/L (ref 22–29)
Calcium: 9.6 mg/dL (ref 8.4–10.4)
Creatinine: 0.8 mg/dL (ref 0.7–1.3)
GLUCOSE: 87 mg/dL (ref 70–140)
POTASSIUM: 4.2 meq/L (ref 3.5–5.1)
Sodium: 140 mEq/L (ref 136–145)
Total Bilirubin: 0.69 mg/dL (ref 0.20–1.20)
Total Protein: 7.8 g/dL (ref 6.4–8.3)

## 2017-07-10 LAB — CBC WITH DIFFERENTIAL/PLATELET
BASO%: 0.5 % (ref 0.0–2.0)
BASOS ABS: 0 10*3/uL (ref 0.0–0.1)
EOS ABS: 0.1 10*3/uL (ref 0.0–0.5)
EOS%: 1.8 % (ref 0.0–7.0)
HEMATOCRIT: 38.8 % (ref 38.4–49.9)
HGB: 12.6 g/dL — ABNORMAL LOW (ref 13.0–17.1)
LYMPH#: 0.7 10*3/uL — AB (ref 0.9–3.3)
LYMPH%: 15 % (ref 14.0–49.0)
MCH: 28.2 pg (ref 27.2–33.4)
MCHC: 32.4 g/dL (ref 32.0–36.0)
MCV: 87 fL (ref 79.3–98.0)
MONO#: 0.5 10*3/uL (ref 0.1–0.9)
MONO%: 9.6 % (ref 0.0–14.0)
NEUT#: 3.4 10*3/uL (ref 1.5–6.5)
NEUT%: 73.1 % (ref 39.0–75.0)
PLATELETS: 182 10*3/uL (ref 140–400)
RBC: 4.46 10*6/uL (ref 4.20–5.82)
RDW: 20.2 % — ABNORMAL HIGH (ref 11.0–14.6)
WBC: 4.7 10*3/uL (ref 4.0–10.3)

## 2017-07-10 LAB — UA PROTEIN, DIPSTICK - CHCC: Protein, ur: NEGATIVE mg/dL

## 2017-07-10 MED ORDER — OXYCODONE HCL 5 MG PO TABS: 5.0000 mg | ORAL_TABLET | ORAL | 0 refills | Status: DC | PRN

## 2017-07-10 MED ORDER — MORPHINE SULFATE ER 30 MG PO TBCR: 30.0000 mg | EXTENDED_RELEASE_TABLET | Freq: Three times a day (TID) | ORAL | 0 refills | Status: DC

## 2017-07-10 NOTE — Assessment & Plan Note (Addendum)
This is a very pleasant 61 year old white male with a stage IV non-small cell lung cancer, adenocarcinoma status post 6 cycles of induction systemic chemotherapy with carbo platinum, Alimta and Avastin with partial response. This was followed by maintenance treatment with Alimta and Avastin for 3 cycles and his treatment was discontinued secondary to development of cavitary pneumonia in the right upper lobe. The patient is currently on observation. The most recent CT scan of the chest, abdomen and pelvis showed no evidence for disease progression and the patient has improvement of the cavitary structure in the right upper lobe. This scan does show sclerotic lesions in L5 and the right iliac wing which were stable at that time.  He now has worsening pain to his right hip and lower back. I have ordered an MRI of the lumbar spine and an MRI of the pelvis to better characterize these bone metastases. Refills of MS Contin and oxycodone were given to the patient today. He was advised to take his MS Contin as prescribed which is 1 tablet every 8 hours and to use the oxycodone for breakthrough pain.  We will see the patient back several days after the MRI to review the results.  He was advised to call immediately if he has any concerning symptoms in the interval. The patient voices understanding of current disease status and treatment options and is in agreement with the current care plan. All questions were answered. The patient knows to call the clinic with any problems, questions or concerns. We can certainly see the patient much sooner if necessary.

## 2017-07-10 NOTE — Progress Notes (Signed)
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  GUARDANT 360: Negative for EGFR, ALK, ROS 1, BRAF mutations but showed amplification of EGFR and MET  SUMMARY OF ONCOLOGIC HISTORY:  No history exists.   PRIOR THERAPY: 1) Palliative systemic chemotherapy to the right superior mediastinal and right paratracheal lymphadenopathy under the care of Dr. Lisbeth Renshaw. 2) Systemic chemotherapy with carboplatin for AUC of 5, Alimta 500 MG/M2 and Avastin 15 MG/KG every 3 weeks is status post 6 cycles. First cycle was given on 07/28/2016.  3) Maintenance systemic chemotherapy with Alimta 500 MG/M2 and Avastin 15 MG/KG every 3 weeks. First dose 11/19/2016. Status post 3 cycles. Avastin was discontinued after cycle #3 secondary to development of right upper lobe cavitary lesion. Treatment is currently on hold secondary to the cavitary lesion in the right upper lobe.  CURRENT THERAPY: Observation  INTERVAL HISTORY: Edward Spence 61 y.o. male returns for a work in visit by himself. The patient is seen today for increasing pain to his right hip and lower back. His pain has worsened over the past several weeks. The patient has prescription for MS Contin 30 mg to be taken every 8 hours, however, the patient tells me that he takes 2 tablets in the morning and often does not take a third pill unless he is hurting and if he does he will take the third pill at bedtime. He has been out of oxycodone for about 10 days. He denied having any fatigue or weakness. He denied having any chest pain, shortness breath, cough or hemoptysis. He has no fever or chills. He has no nausea,  vomiting, diarrhea or constipation. He has gained 2 pounds since his last visit. The patient is here for evaluation of his pain.   Patient Active Problem List   Diagnosis Date Noted  . Cavitary pneumonia 01/21/2017  . Neutropenia, drug-induced ([place]) 09/18/2016  . Constipation 07/23/2016  . Dehydration 07/13/2016  . Nausea without vomiting 07/13/2016  . Dysphagia 07/13/2016  . Rash 07/13/2016  . Antineoplastic chemotherapy induced pancytopenia (CODE) ([place]) 07/13/2016  . Encounter for antineoplastic chemotherapy 06/24/2016  . Osseous metastasis ([place]) 06/13/2016  . Malignant neoplasm of bronchus of right upper lobe ([place]) 05/26/2016  . Lung mass 05/19/2016    is allergic to penicillins.  MEDICAL HISTORY: Past Medical History:  Diagnosis Date  . Cavitary pneumonia 01/21/2017  . Constipation 07/23/2016  . Encounter for antineoplastic chemotherapy 06/24/2016  . Lung cancer ([place])   . Lung mass 05/19/2016  . Pneumonia    in college    SURGICAL HISTORY: Past Surgical History:  Procedure Laterality Date  . APPENDECTOMY    . VIDEO BRONCHOSCOPY Bilateral 02/27/2017   Procedure: VIDEO BRONCHOSCOPY WITH FLUORO;  Surgeon: Marshell Garfinkel, MD;  Location: [facility];  Service: Cardiopulmonary;  Laterality: Bilateral;  . VIDEO BRONCHOSCOPY WITH ENDOBRONCHIAL ULTRASOUND N/A 05/22/2016   Procedure: VIDEO BRONCHOSCOPY WITH ENDOBRONCHIAL ULTRASOUND;  Surgeon: [name], MD;  Location: [facility];  Service: Thoracic;  Laterality: N/A;    SOCIAL HISTORY: Social History   Social History  . Marital status: Divorced    Spouse name: N/A  . Number of children: N/A  .  Years of education: N/A   Occupational History  . Not on file.   Social History Main Topics  . Smoking status: Current Every Day Smoker    Packs/day: 0.25    Years: 40.00    Types: Cigarettes    Last attempt to quit: 04/29/2016  . Smokeless tobacco: Never Used     Comment: 1-5 per day  . Alcohol use No  . Drug use: No   . Sexual activity: Not on file   Other Topics Concern  . Not on file   Social History Narrative  . No narrative on file    FAMILY HISTORY: Family History  Problem Relation Age of Onset  . Cancer Mother   . COPD Father   . Heart failure Father     Review of Systems  Constitutional: Negative.   HENT:  Negative.   Eyes: Negative.   Respiratory: Negative.   Cardiovascular: Negative.   Gastrointestinal: Negative.   Genitourinary: Negative.    Musculoskeletal: Negative for neck pain and neck stiffness.       Right hip and lower back pain.  Skin: Negative.   Neurological: Negative.   Hematological: Negative.   Psychiatric/Behavioral: Negative.       PHYSICAL EXAMINATION  ECOG PERFORMANCE STATUS: 1 - Symptomatic but completely ambulatory  Vitals:   07/10/17 0925  BP: 113/63  Pulse: 99  Resp: 18  Temp: 98.6 F (37 C)  SpO2: 100%    Physical Exam  Constitutional: He is oriented to person, place, and time and well-developed, well-nourished, and in no distress. No distress.  HENT:  Head: Normocephalic.  Mouth/Throat: Oropharynx is clear and moist. No oropharyngeal exudate.  Eyes: Conjunctivae are normal. No scleral icterus.  Neck: Normal range of motion. Neck supple.  Cardiovascular: Normal rate, regular rhythm, normal heart sounds and intact distal pulses.   Pulmonary/Chest: Effort normal and breath sounds normal. No respiratory distress. He has no wheezes. He has no rales.  Abdominal: Soft. Bowel sounds are normal. He exhibits no distension and no mass. There is no tenderness.  Musculoskeletal: Normal range of motion. He exhibits no edema.  Lymphadenopathy:    He has no cervical adenopathy.  Neurological: He is alert and oriented to person, place, and time. He exhibits normal muscle tone. Gait normal. Coordination normal.  Skin: Skin is warm and dry. No rash noted. He is not diaphoretic. No erythema. No pallor.  Psychiatric: Mood, memory, affect and judgment  normal.  Vitals reviewed.   LABORATORY DATA:  CBC    Component Value Date/Time   WBC 4.7 07/10/2017 0857   WBC 5.2 03/11/2017 1407   RBC 4.46 07/10/2017 0857   RBC 3.68 (L) 03/11/2017 1407   HGB 12.6 (L) 07/10/2017 0857   HCT 38.8 07/10/2017 0857   PLT 182 07/10/2017 0857   MCV 87.0 07/10/2017 0857   MCH 28.2 07/10/2017 0857   MCH 29.5 02/19/2017 0840   MCHC 32.4 07/10/2017 0857   MCHC 31.1 03/11/2017 1407   RDW 20.2 (H) 07/10/2017 0857   LYMPHSABS 0.7 (L) 07/10/2017 0857   MONOABS 0.5 07/10/2017 0857   EOSABS 0.1 07/10/2017 0857   BASOSABS 0.0 07/10/2017 0857    CMP     Component Value Date/Time   NA 140 07/10/2017 0857   K 4.2 07/10/2017 0857   CL 107 05/22/2016 0641   CO2 24 07/10/2017 0857   GLUCOSE 87 07/10/2017 0857   BUN 10.2 07/10/2017 0857   CREATININE 0.8 07/10/2017 0857   CALCIUM 9.6 07/10/2017 0857  PROT 7.8 07/10/2017 0857   ALBUMIN 3.7 07/10/2017 0857   AST 14 07/10/2017 0857   ALT <6 07/10/2017 0857   ALKPHOS 68 07/10/2017 0857   BILITOT 0.69 07/10/2017 0857   GFRNONAA >60 05/22/2016 0641   GFRAA >60 05/22/2016 0641    RADIOGRAPHIC STUDIES:  No results found.  ASSESSMENT and THERAPY PLAN:   Malignant neoplasm of bronchus of right upper lobe [facility]) This is a very pleasant 61 year old white male with a stage IV non-small cell lung cancer, adenocarcinoma status post 6 cycles of induction systemic chemotherapy with carbo platinum, Alimta and Avastin with partial response. This was followed by maintenance treatment with Alimta and Avastin for 3 cycles and his treatment was discontinued secondary to development of cavitary pneumonia in the right upper lobe. The patient is currently on observation. The most recent CT scan of the chest, abdomen and pelvis showed no evidence for disease progression and the patient has improvement of the cavitary structure in the right upper lobe. This scan does show sclerotic lesions in L5 and the right iliac wing which  were stable at that time.  He now has worsening pain to his right hip and lower back. I have ordered an MRI of the lumbar spine and an MRI of the pelvis to better characterize these bone metastases. Refills of MS Contin and oxycodone were given to the patient today. He was advised to take his MS Contin as prescribed which is 1 tablet every 8 hours and to use the oxycodone for breakthrough pain.  We will see the patient back several days after the MRI to review the results.  He was advised to call immediately if he has any concerning symptoms in the interval. The patient voices understanding of current disease status and treatment options and is in agreement with the current care plan. All questions were answered. The patient knows to call the clinic with any problems, questions or concerns. We can certainly see the patient much sooner if necessary.   Orders Placed This Encounter  Procedures  . MR Pelvis W Wo Contrast    Standing Status:   Future    Standing Expiration Date:   09/09/2018    Order Specific Question:   If indicated for the ordered procedure, I authorize the administration of contrast media per Radiology protocol    Answer:   Yes    Order Specific Question:   What is the patient's sedation requirement?    Answer:   No Sedation    Order Specific Question:   Does the patient have a pacemaker or implanted devices?    Answer:   No    Order Specific Question:   Radiology Contrast Protocol - do NOT remove file path    Answer:   \\[email or web address]    Order Specific Question:   Reason for Exam additional comments    Answer:   Lung cancer. Bone mets. Increased pain to lower back and R hip.    Order Specific Question:   Preferred imaging location?    Answer:   [facility] (table limit-350lbs)  . MR Lumbar Spine W Wo Contrast    Standing Status:   Future    Standing Expiration Date:   09/09/2018    Order Specific Question:   If indicated for the  ordered procedure, I authorize the administration of contrast media per Radiology protocol    Answer:   Yes    Order Specific Question:   What is the patient's sedation requirement?  Answer:   No Sedation    Order Specific Question:   Does the patient have a pacemaker or implanted devices?    Answer:   No    Order Specific Question:   Radiology Contrast Protocol - do NOT remove file path    Answer:   \\[email or web address]    Order Specific Question:   Reason for Exam additional comments    Answer:   Lung cancer. Bone mets. Increased pain to lower back and R hip.    Order Specific Question:   Preferred imaging location?    Answer:   [facility] (table limit-350lbs)    All questions were answered. The patient knows to call the clinic with any problems, questions or concerns. We can certainly see the patient much sooner if necessary.  Mikey Bussing, NP 07/10/2017

## 2017-07-14 ENCOUNTER — Telehealth: Payer: Self-pay | Admitting: Oncology

## 2017-07-14 NOTE — Telephone Encounter (Signed)
Called patient to schedule MRI but had to leave a message.

## 2017-07-15 ENCOUNTER — Telehealth: Payer: Self-pay | Admitting: Oncology

## 2017-07-15 NOTE — Telephone Encounter (Signed)
No f/u scheduled per 10/12 due to patient already having a f/u scheduled with MM on 11/12 to review ct . Per [place] okay to leave appts as is.

## 2017-07-17 ENCOUNTER — Telehealth: Payer: Self-pay | Admitting: Medical Oncology

## 2017-07-17 NOTE — Telephone Encounter (Addendum)
Scans oct 22. Does not want to wait until nov to get scan results. Please call him  with results. Message sent to [facility] and Armanda Magic.

## 2017-07-17 NOTE — Telephone Encounter (Signed)
MRI denied by insurance. [facility] managed care.

## 2017-07-17 NOTE — Telephone Encounter (Signed)
Notified pt MD out of office today. Pt aware and appreciated the call.

## 2017-07-20 ENCOUNTER — Ambulatory Visit (HOSPITAL_COMMUNITY): Payer: Medicaid Other

## 2017-07-21 ENCOUNTER — Telehealth: Payer: Self-pay | Admitting: Medical Oncology

## 2017-07-21 NOTE — Telephone Encounter (Signed)
MRI denied. What is next step?

## 2017-07-21 NOTE — Telephone Encounter (Signed)
We will wait for the next staging scan in November.

## 2017-07-22 NOTE — Telephone Encounter (Signed)
Called pt, notified insurance denied MRI.  Reviewed with pt appt on 11/8, lab & CT.  MD 11/12 to go over CT results.  Pt and ex-wife verbalized understanding.  No further questions.

## 2017-07-29 ENCOUNTER — Other Ambulatory Visit: Payer: Self-pay | Admitting: Medical Oncology

## 2017-07-29 DIAGNOSIS — J984 Other disorders of lung: Secondary | ICD-10-CM

## 2017-07-29 DIAGNOSIS — J189 Pneumonia, unspecified organism: Secondary | ICD-10-CM

## 2017-07-29 DIAGNOSIS — C3411 Malignant neoplasm of upper lobe, right bronchus or lung: Secondary | ICD-10-CM

## 2017-07-29 DIAGNOSIS — Z5111 Encounter for antineoplastic chemotherapy: Secondary | ICD-10-CM

## 2017-07-29 MED ORDER — OXYCODONE HCL 5 MG PO TABS: 5.0000 mg | ORAL_TABLET | ORAL | 0 refills | Status: DC | PRN

## 2017-08-05 ENCOUNTER — Telehealth: Payer: Self-pay | Admitting: Medical Oncology

## 2017-08-05 NOTE — Telephone Encounter (Signed)
Wants to change appt to afternoon. I told him there is no open appt.

## 2017-08-06 ENCOUNTER — Ambulatory Visit (HOSPITAL_COMMUNITY)
Admission: RE | Admit: 2017-08-06 | Discharge: 2017-08-06 | Disposition: A | Discharge status: Home / Self Care | Payer: Medicaid Other | Source: Ambulatory Visit | Attending: Internal Medicine | Admitting: Internal Medicine

## 2017-08-06 ENCOUNTER — Other Ambulatory Visit (HOSPITAL_BASED_OUTPATIENT_CLINIC_OR_DEPARTMENT_OTHER): Payer: Medicaid Other

## 2017-08-06 DIAGNOSIS — I313 Pericardial effusion (noninflammatory): Secondary | ICD-10-CM | POA: Diagnosis not present

## 2017-08-06 DIAGNOSIS — Z9889 Other specified postprocedural states: Secondary | ICD-10-CM | POA: Diagnosis not present

## 2017-08-06 DIAGNOSIS — J984 Other disorders of lung: Secondary | ICD-10-CM

## 2017-08-06 DIAGNOSIS — J189 Pneumonia, unspecified organism: Secondary | ICD-10-CM

## 2017-08-06 DIAGNOSIS — R590 Localized enlarged lymph nodes: Secondary | ICD-10-CM | POA: Insufficient documentation

## 2017-08-06 DIAGNOSIS — C7951 Secondary malignant neoplasm of bone: Secondary | ICD-10-CM

## 2017-08-06 DIAGNOSIS — I700 Atherosclerosis of aorta: Secondary | ICD-10-CM | POA: Diagnosis not present

## 2017-08-06 DIAGNOSIS — C3411 Malignant neoplasm of upper lobe, right bronchus or lung: Secondary | ICD-10-CM

## 2017-08-06 DIAGNOSIS — R918 Other nonspecific abnormal finding of lung field: Secondary | ICD-10-CM | POA: Insufficient documentation

## 2017-08-06 LAB — CBC WITH DIFFERENTIAL/PLATELET
BASO%: 0.6 % (ref 0.0–2.0)
BASOS ABS: 0 10*3/uL (ref 0.0–0.1)
EOS%: 4.8 % (ref 0.0–7.0)
Eosinophils Absolute: 0.3 10*3/uL (ref 0.0–0.5)
HEMATOCRIT: 39.9 % (ref 38.4–49.9)
HEMOGLOBIN: 12.7 g/dL — AB (ref 13.0–17.1)
LYMPH#: 0.8 10*3/uL — AB (ref 0.9–3.3)
LYMPH%: 15.7 % (ref 14.0–49.0)
MCH: 28.7 pg (ref 27.2–33.4)
MCHC: 31.8 g/dL — ABNORMAL LOW (ref 32.0–36.0)
MCV: 90.3 fL (ref 79.3–98.0)
MONO#: 0.6 10*3/uL (ref 0.1–0.9)
MONO%: 11.7 % (ref 0.0–14.0)
NEUT#: 3.5 10*3/uL (ref 1.5–6.5)
NEUT%: 67.2 % (ref 39.0–75.0)
Platelets: 189 10*3/uL (ref 140–400)
RBC: 4.42 10*6/uL (ref 4.20–5.82)
RDW: 17.3 % — ABNORMAL HIGH (ref 11.0–14.6)
WBC: 5.2 10*3/uL (ref 4.0–10.3)
nRBC: 0 % (ref 0–0)

## 2017-08-06 LAB — COMPREHENSIVE METABOLIC PANEL
ALBUMIN: 3.6 g/dL (ref 3.5–5.0)
ALK PHOS: 60 U/L (ref 40–150)
ALT: 7 U/L (ref 0–55)
AST: 12 U/L (ref 5–34)
Anion Gap: 9 mEq/L (ref 3–11)
BUN: 13.4 mg/dL (ref 7.0–26.0)
CALCIUM: 9.8 mg/dL (ref 8.4–10.4)
CO2: 26 mEq/L (ref 22–29)
Chloride: 104 mEq/L (ref 98–109)
Creatinine: 0.8 mg/dL (ref 0.7–1.3)
Glucose: 86 mg/dl (ref 70–140)
POTASSIUM: 4.5 meq/L (ref 3.5–5.1)
Sodium: 139 mEq/L (ref 136–145)
Total Bilirubin: 0.4 mg/dL (ref 0.20–1.20)
Total Protein: 7.6 g/dL (ref 6.4–8.3)

## 2017-08-06 MED ORDER — IOPAMIDOL (ISOVUE-300) INJECTION 61%
100.0000 mL | Freq: Once | INTRAVENOUS | Status: AC | PRN
  Administered 2017-08-06: 100 mL via INTRAVENOUS

## 2017-08-06 MED ORDER — IOPAMIDOL (ISOVUE-300) INJECTION 61%
INTRAVENOUS | Status: AC
  Administered 2017-08-06: 100 mL via INTRAVENOUS
  Filled 2017-08-06: qty 100

## 2017-08-07 ENCOUNTER — Telehealth: Payer: Self-pay

## 2017-08-07 NOTE — Telephone Encounter (Signed)
Pt wanted to know if Monday's appt was just with Dr Julien Nordmann or if lab was involved. He had labs on 11/8 for the CT so no labs on Monday.

## 2017-08-10 ENCOUNTER — Encounter: Payer: Self-pay | Admitting: Internal Medicine

## 2017-08-10 ENCOUNTER — Ambulatory Visit (HOSPITAL_BASED_OUTPATIENT_CLINIC_OR_DEPARTMENT_OTHER): Payer: Medicaid Other | Admitting: Internal Medicine

## 2017-08-10 ENCOUNTER — Encounter: Payer: Self-pay | Admitting: *Deleted

## 2017-08-10 VITALS — BP 113/65 | HR 93 | Temp 98.1°F | Resp 18 | Ht 75.0 in | Wt 164.4 lb

## 2017-08-10 DIAGNOSIS — C7951 Secondary malignant neoplasm of bone: Secondary | ICD-10-CM | POA: Diagnosis not present

## 2017-08-10 DIAGNOSIS — R5382 Chronic fatigue, unspecified: Secondary | ICD-10-CM

## 2017-08-10 DIAGNOSIS — Z5112 Encounter for antineoplastic immunotherapy: Secondary | ICD-10-CM

## 2017-08-10 DIAGNOSIS — C3411 Malignant neoplasm of upper lobe, right bronchus or lung: Secondary | ICD-10-CM

## 2017-08-10 DIAGNOSIS — Z7189 Other specified counseling: Secondary | ICD-10-CM

## 2017-08-10 DIAGNOSIS — M545 Low back pain: Secondary | ICD-10-CM

## 2017-08-10 NOTE — Progress Notes (Signed)
DISCONTINUE ON PATHWAY REGIMEN - Non-Small Cell Lung     A cycle is every 21 days:     Pemetrexed        Dose Mod: None  **Always confirm dose/schedule in your pharmacy ordering system**    REASON: Disease Progression PRIOR TREATMENT: [id number]: Pemetrexed 500 mg/m2 q21 Days Until Progression or Unacceptable Toxicity TREATMENT RESPONSE: Progressive Disease (PD)  START ON PATHWAY REGIMEN - Non-Small Cell Lung     A cycle is every 28 days:     Nivolumab   **Always confirm dose/schedule in your pharmacy ordering system**    Patient Characteristics: Stage IV Metastatic, Nonsquamous, Second Line - Chemotherapy/Immunotherapy, PS = 0, 1, No Prior PD-1/PD-L1  Inhibitor and Immunotherapy Candidate AJCC T Category: T3 Current Disease Status: Distant Metastases AJCC N Category: N3 AJCC M Category: M1c AJCC 8 Stage Grouping: IVB Histology: Nonsquamous Cell ROS1 Rearrangement Status: Negative T790M Mutation Status: Not Applicable - EGFR Mutation Negative/Unknown Other Mutations/Biomarkers: No Other Actionable Mutations PD-L1 Expression Status: Quantity Not Sufficient Chemotherapy/Immunotherapy LOT: Second Line Chemotherapy/Immunotherapy Molecular Targeted Therapy: Not Appropriate ALK Translocation Status: Negative Would you be surprised if this patient died  in the next year<= I would NOT be surprised if this patient died in the next year EGFR Mutation Status: Negative/Wild Type BRAF [id number] Mutation Status: Negative Performance Status: PS = 0, 1 Immunotherapy Candidate Status: Candidate for Immunotherapy Prior Immunotherapy Status: No Prior PD-1/PD-L1 Inhibitor Intent of Therapy: Non-Curative / Palliative Intent, Discussed with Patient

## 2017-08-10 NOTE — Progress Notes (Signed)
[place] [phone]   [phone]  OFFICE PROGRESS NOTE  [name], FNP [address] [place] [address]  DIAGNOSIS: Stage IV (T3, N3, M1b) non-small cell lung cancer, adenocarcinoma diagnosed in August 2017 and presented with right upper lobe lung nodule as well as large right superior mediastinal and right paratracheal lymphadenopathy and bone metastases in the right iliac and L5 vertebral body.  GUARDANT 360: Negative for EGFR, ALK, ROS 1, BRAF mutations but showed amplification of EGFR and MET  PRIOR THERAPY: 1) Palliative systemic chemotherapy to the right superior mediastinal and right paratracheal lymphadenopathy under the care of Dr. Lisbeth Renshaw. 2) Systemic chemotherapy with carboplatin for AUC of 5, Alimta 500 MG/M2 and Avastin 15 MG/KG every 3 weeks is status post 6 cycles. First cycle was given on 07/28/2016.  3) Maintenance systemic chemotherapy with Alimta 500 MG/M2 and Avastin 15 MG/KG every 3 weeks. First dose 11/19/2016. Status post 3 cycles. Avastin was discontinued after cycle #3 secondary to development of right upper lobe cavitary lesion. Treatment is currently on hold secondary to the cavitary lesion in the right upper lobe.   CURRENT THERAPY: Second line treatment with immunotherapy was Nivolumab 480 mg IV every 4 weeks.  First cycle August 25, 2017.  INTERVAL HISTORY: Edward Spence 61 y.o. male returns to the clinic today for follow-up visit.  The patient is feeling fine except for the persistent back pain.  He has been using his pain medication more than prescribed.  He denied having any chest pain, shortness breath, cough or hemoptysis.  He denied having any fever or chills.  He has no nausea, vomiting, diarrhea but has constipation.  He denied having any recent weight loss or night sweats.  He has no fever or chills.  The patient had repeat CT scan of the chest, abdomen and pelvis performed recently and he is here for  evaluation and discussion of his discuss results.  MEDICAL HISTORY: Past Medical History:  Diagnosis Date  . Cavitary pneumonia 01/21/2017  . Constipation 07/23/2016  . Encounter for antineoplastic chemotherapy 06/24/2016  . Lung cancer ([place])   . Lung mass 05/19/2016  . Pneumonia    in college    ALLERGIES:  is allergic to penicillins.  MEDICATIONS:  Current Outpatient Medications  Medication Sig Dispense Refill  . albuterol (PROVENTIL HFA;VENTOLIN HFA) 108 (90 Base) MCG/ACT inhaler Inhale 2 puffs into the lungs every 6 (six) hours as needed for wheezing or shortness of breath. 1 Inhaler 2  . morphine (MS CONTIN) 30 MG 12 hr tablet Take 1 tablet (30 mg total) by mouth 3 (three) times daily. 90 tablet 0  . oxyCODONE (OXY IR/ROXICODONE) 5 MG immediate release tablet Take 1-2 tablets (5-10 mg total) by mouth every 4 (four) hours as needed for severe pain. 45 tablet 0  . Respiratory Therapy Supplies (FLUTTER) DEVI USE AS DIRECTED (Patient not taking: Reported on 07/10/2017) 1 each 0   No current facility-administered medications for this visit.     SURGICAL HISTORY:  Past Surgical History:  Procedure Laterality Date  . APPENDECTOMY      REVIEW OF SYSTEMS:  Constitutional: positive for fatigue Eyes: negative Ears, nose, mouth, throat, and face: negative Respiratory: negative Cardiovascular: negative Gastrointestinal: negative Genitourinary:negative Integument/breast: negative Hematologic/lymphatic: negative Musculoskeletal:positive for back pain Neurological: negative Behavioral/Psych: negative Endocrine: negative Allergic/Immunologic: negative   PHYSICAL EXAMINATION: General appearance: alert, cooperative and no distress Head: Normocephalic, without obvious abnormality, atraumatic Neck: no adenopathy, no JVD,  supple, symmetrical, trachea midline and thyroid not enlarged, symmetric, no tenderness/mass/nodules Lymph nodes: Cervical, supraclavicular, and axillary nodes  normal. Resp: clear to auscultation bilaterally Back: symmetric, no curvature. ROM normal. No CVA tenderness. Cardio: regular rate and rhythm, S1, S2 normal, no murmur, click, rub or gallop GI: soft, non-tender; bowel sounds normal; no masses,  no organomegaly Extremities: extremities normal, atraumatic, no cyanosis or edema  ECOG PERFORMANCE STATUS: 1 - Symptomatic but completely ambulatory  Blood pressure 113/65, pulse 93, temperature 98.1 F (36.7 C), temperature source Oral, resp. rate 18, height 6' 3"  (1.905 m), weight 164 lb 6.4 oz (74.6 kg), SpO2 100 %.  LABORATORY DATA: Lab Results  Component Value Date   WBC 5.2 08/06/2017   HGB 12.7 (L) 08/06/2017   HCT 39.9 08/06/2017   MCV 90.3 08/06/2017   PLT 189 08/06/2017      Chemistry      Component Value Date/Time   NA 139 08/06/2017 1052   K 4.5 08/06/2017 1052   CL 107 05/22/2016 0641   CO2 26 08/06/2017 1052   BUN 13.4 08/06/2017 1052   CREATININE 0.8 08/06/2017 1052      Component Value Date/Time   CALCIUM 9.8 08/06/2017 1052   ALKPHOS 60 08/06/2017 1052   AST 12 08/06/2017 1052   ALT 7 08/06/2017 1052   BILITOT 0.40 08/06/2017 1052       RADIOGRAPHIC STUDIES: Ct Chest W Contrast  Result Date: 08/06/2017 CLINICAL DATA:  Patient with history of lung cancer and osseous metastatic disease. Follow-up evaluation. EXAM: CT CHEST, ABDOMEN, AND PELVIS WITH CONTRAST TECHNIQUE: Multidetector CT imaging of the chest, abdomen and pelvis was performed following the standard protocol during bolus administration of intravenous contrast. CONTRAST:  100 cc Isovue-300 COMPARISON:  CT CAP 05/04/2017. FINDINGS: CT CHEST FINDINGS Cardiovascular: Normal heart size. Stable moderate pericardial effusion. Mediastinum/Nodes: Interval increase in size of right peritracheal lymph node measuring 1.5 cm (image 20; series 2), previously 0.9 cm. Slight interval increase in size of subcarinal lymph node measuring 10 mm (image 31; series 2),  previously 8 mm. Additionally there is a new 9 mm subcarinal lymph node with central low density (image 28; series 2). Lungs/Pleura: Central airways are patent. Grossly unchanged cavitary structure within the right upper lobe with surrounding confluent soft tissue, bronchiectasis and volume loss. Slight interval increase in subpleural reticular consolidative opacities within the medial right lower lobe (image 63; series 4). Interval increase in size of 7 mm subpleural left lower lobe nodule (image 93; series 4, previously 3 mm. Musculoskeletal: No aggressive or acute appearing osseous lesions. CT ABDOMEN PELVIS FINDINGS Hepatobiliary: Liver is normal in size and contour. Gallbladder is unremarkable. No intrahepatic or extrahepatic biliary ductal dilatation. Pancreas: Unremarkable Spleen: Unremarkable Adrenals/Urinary Tract: Normal adrenal glands. Kidneys enhance symmetrically with contrast. No hydronephrosis. Stable subcentimeter bilateral low-attenuation renal lesions, too small to characterize. Urinary bladder is unremarkable. Stomach/Bowel: Stool throughout the colon. No abnormal bowel wall thickening or evidence for bowel obstruction. No free fluid or free intraperitoneal air. Normal morphology of the stomach. Vascular/Lymphatic: Normal caliber abdominal aorta. Peripheral calcified atherosclerotic plaque. No retroperitoneal lymphadenopathy. Reproductive: Prostate is unremarkable. Other: None. Musculoskeletal: Sclerotic lesion within the medial right iliac wing adjacent to the SI joint is unchanged measuring approximately 7 cm (image 109; series 2). Unchanged small sclerotic lesion within the L5 vertebral body. Thoracic and lumbar spine degenerative changes. IMPRESSION: 1. Interval increase in size of superior mediastinal and subcarinal adenopathy. 2. Interval increase in size of 7 mm subpleural left lower  lobe pulmonary nodule. 3. Interval development of subpleural ground-glass and consolidative opacities within  the right lower lobe, potentially post radiation changes. Recommend attention on follow-up. 4. Grossly unchanged cavitary structure within the right upper lung which may represent evolving radiation changes. Recommend attention on follow-up. 5. Unchanged sclerotic lesions within the right iliac wing and L5 vertebral body. 6. Persistent moderate pericardial effusion. 7.  Aortic Atherosclerosis (ICD10-I70.0). Electronically Signed   By: Lovey Newcomer M.D.   On: 08/06/2017 15:30   Ct Abdomen Pelvis W Contrast  Result Date: 08/06/2017 CLINICAL DATA:  Patient with history of lung cancer and osseous metastatic disease. Follow-up evaluation. EXAM: CT CHEST, ABDOMEN, AND PELVIS WITH CONTRAST TECHNIQUE: Multidetector CT imaging of the chest, abdomen and pelvis was performed following the standard protocol during bolus administration of intravenous contrast. CONTRAST:  100 cc Isovue-300 COMPARISON:  CT CAP 05/04/2017. FINDINGS: CT CHEST FINDINGS Cardiovascular: Normal heart size. Stable moderate pericardial effusion. Mediastinum/Nodes: Interval increase in size of right peritracheal lymph node measuring 1.5 cm (image 20; series 2), previously 0.9 cm. Slight interval increase in size of subcarinal lymph node measuring 10 mm (image 31; series 2), previously 8 mm. Additionally there is a new 9 mm subcarinal lymph node with central low density (image 28; series 2). Lungs/Pleura: Central airways are patent. Grossly unchanged cavitary structure within the right upper lobe with surrounding confluent soft tissue, bronchiectasis and volume loss. Slight interval increase in subpleural reticular consolidative opacities within the medial right lower lobe (image 63; series 4). Interval increase in size of 7 mm subpleural left lower lobe nodule (image 93; series 4, previously 3 mm. Musculoskeletal: No aggressive or acute appearing osseous lesions. CT ABDOMEN PELVIS FINDINGS Hepatobiliary: Liver is normal in size and contour. Gallbladder  is unremarkable. No intrahepatic or extrahepatic biliary ductal dilatation. Pancreas: Unremarkable Spleen: Unremarkable Adrenals/Urinary Tract: Normal adrenal glands. Kidneys enhance symmetrically with contrast. No hydronephrosis. Stable subcentimeter bilateral low-attenuation renal lesions, too small to characterize. Urinary bladder is unremarkable. Stomach/Bowel: Stool throughout the colon. No abnormal bowel wall thickening or evidence for bowel obstruction. No free fluid or free intraperitoneal air. Normal morphology of the stomach. Vascular/Lymphatic: Normal caliber abdominal aorta. Peripheral calcified atherosclerotic plaque. No retroperitoneal lymphadenopathy. Reproductive: Prostate is unremarkable. Other: None. Musculoskeletal: Sclerotic lesion within the medial right iliac wing adjacent to the SI joint is unchanged measuring approximately 7 cm (image 109; series 2). Unchanged small sclerotic lesion within the L5 vertebral body. Thoracic and lumbar spine degenerative changes. IMPRESSION: 1. Interval increase in size of superior mediastinal and subcarinal adenopathy. 2. Interval increase in size of 7 mm subpleural left lower lobe pulmonary nodule. 3. Interval development of subpleural ground-glass and consolidative opacities within the right lower lobe, potentially post radiation changes. Recommend attention on follow-up. 4. Grossly unchanged cavitary structure within the right upper lung which may represent evolving radiation changes. Recommend attention on follow-up. 5. Unchanged sclerotic lesions within the right iliac wing and L5 vertebral body. 6. Persistent moderate pericardial effusion. 7.  Aortic Atherosclerosis (ICD10-I70.0). Electronically Signed   By: Lovey Newcomer M.D.   On: 08/06/2017 15:30    ASSESSMENT AND PLAN:  This is a very pleasant 61 years old white male with a stage IV non-small cell lung cancer, adenocarcinoma status post 6 cycles of induction systemic chemotherapy with carbo platinum,  Alimta and Avastin with partial response. This was followed by maintenance treatment with Alimta and Avastin for 3 cycles and his treatment was discontinued secondary to development of cavitary pneumonia in the right upper  lobe. The patient is currently on observation. He had repeat CT scan of the chest, abdomen and pelvis that showed mild increase in the size of the superior mediastinal and subcarinal lymphadenopathy as well as a subpleural left lower lobe pulmonary nodule. I personally and independently reviewed the scan images and discussed the results with the patient today. I recommended for the patient treatment with second line immunotherapy with Nivolumab 480 mg IV every 4 weeks. I discussed with the patient adverse effect of this treatment including but not limited to immunotherapy mediated skin rash, diarrhea, inflammation of the lung, kidney, liver, thyroid or other endocrine dysfunction. He is expected to start the first dose of this treatment on August 25, 2017. For the persistent low back pain, I would refer the patient to Dr. Sondra Come from radiation oncology for consideration of palliative radiotherapy to the osseous metastatic disease in this area.  I would also resume his treatment with Xgeva every 4 weeks.  He will continue on his current pain medication for now. The patient will come back for follow-up visit with the start of the first cycle of his treatment. He was advised to call immediately if he has any concerning symptoms in the interval. The patient voices understanding of current disease status and treatment options and is in agreement with the current care plan. All questions were answered. The patient knows to call the clinic with any problems, questions or concerns. We can certainly see the patient much sooner if necessary.  Disclaimer: This note was dictated with voice recognition software. Similar sounding words can inadvertently be transcribed and may not be corrected  upon review.

## 2017-08-12 NOTE — Progress Notes (Signed)
Histology and Location of Primary Cancer: Stagestage IV non-small cell lung cancer, adenocarcinoma, now osseous metastatic disease    Sites of Visceral and Bony Metastatic Disease:Low back; Right hip,Sclerotic lesion within the medial right iliac wing adjacent to the SI joint    Musculoskeletal: Sclerotic lesion within the medial right iliac wing adjacent to the SI joint is unchanged measuring approximately 7 cm (image 109; series 2). Unchanged small sclerotic lesion within the L5 vertebral body. Thoracic and lumbar spine degenerative changes.  IMPRESSION:08-06-17  CT Abdomen Pelvis W Contrast 1. Interval increase in size of superior mediastinal and subcarinal adenopathy. 2. Interval increase in size of 7 mm subpleural left lower lobe pulmonary nodule. 3. Interval development of subpleural ground-glass and consolidative opacities within the right lower lobe, potentially post radiation changes. Recommend attention on follow-up. 4. Grossly unchanged cavitary structure within the right upper lung which may represent evolving radiation changes. Recommend attention on follow-up. 5. Unchanged sclerotic lesions within the right iliac wing and L5 vertebral body. 6. Persistent moderate pericardial effusion. 7.  Aortic Atherosclerosis (ICD10-I70.0).  Musculoskeletal: Sclerotic lesion within the medial right iliac wing adjacent to the SI joint is unchanged measuring approximately 7 cm (image 109; series 2). Unchanged small sclerotic lesion within the L5 vertebral body. Thoracic and lumbar spine degenerative changes.  IMPRESSION:08-06-17   CT Chest W Contrast 1. Interval increase in size of superior mediastinal and subcarinal adenopathy. 2. Interval increase in size of 7 mm subpleural left lower lobe pulmonary nodule. 3. Interval development of subpleural ground-glass and consolidative opacities within the right lower lobe, potentially post radiation changes. Recommend attention on  follow-up. 4. Grossly unchanged cavitary structure within the right upper lung which may represent evolving radiation changes. Recommend attention on follow-up. 5. Unchanged sclerotic lesions within the right iliac wing and L5 vertebral body. 6. Persistent moderate pericardial effusion. 7.  Aortic Atherosclerosis (ICD10-I70.0).   Location(s) of Symptomatic Metastases: Low back,Right hip  Past/Anticipated chemotherapy by medical oncology, if any: Dr. Julien Nordmann 08-10-17 Second line treatment with immunotherapy was Nivolumab 480 mg IV every 4 weeks.  First cycle August 25, 2017,  Xgeva every 4 weeks, 08-06-17  08-06-17   CT Chest W Contrast, 08-06-17 CT Abdomen Pelvis W Contrast   Pain on a scale of 0-10 is:  4/10 Low back and right hip If Spine Met(s), symptoms, if any, include:  Bowel/Bladder retention or incontinence (please describe):   Numbness or weakness in extremities (please describe):   Current Decadron regimen, if applicable: No  Ambulatory status? Walker? Wheelchair?: Ambulatory  SAFETY ISSUES:No  Prior radiation? : yes 06-09-16 06-30-16 Right lung  Pacemaker/ICD? :No  Possible current pregnancy? : No  Is the patient on methotrexate :No  Current Complaints / other details:  Wt Readings from Last 3 Encounters:  08/13/17 162 lb 12.8 oz (73.8 kg)  08/10/17 164 lb 6.4 oz (74.6 kg)  07/10/17 160 lb 6.4 oz (72.8 kg)  BP 91/72 (BP Location: Right Leg, Patient Position: Sitting, Cuff Size: Normal)   Pulse (!) 106   Temp 98.9 F (37.2 C) (Oral)   Resp 18   Ht _0  (1.905 m)   Wt 162 lb 12.8 oz (73.8 kg)   BMI 20.35 kg/m  BP 91/63 (BP Location: Right Leg, Patient Position: Standing, Cuff Size: Normal)   Pulse (!) 104   Temp 98.9 F (37.2 C) (Oral)   Resp 18   Ht _1  (1.905 m)   Wt 162 lb 12.8 oz (73.8 kg)   BMI 20.35 kg/m

## 2017-08-13 ENCOUNTER — Ambulatory Visit
Admission: RE | Admit: 2017-08-13 | Discharge: 2017-08-13 | Disposition: A | Discharge status: Home / Self Care | Payer: Medicaid Other | Source: Ambulatory Visit | Attending: Radiation Oncology | Admitting: Radiation Oncology

## 2017-08-13 ENCOUNTER — Other Ambulatory Visit: Payer: Self-pay

## 2017-08-13 ENCOUNTER — Encounter: Payer: Self-pay | Admitting: Radiation Oncology

## 2017-08-13 DIAGNOSIS — R599 Enlarged lymph nodes, unspecified: Secondary | ICD-10-CM | POA: Diagnosis not present

## 2017-08-13 DIAGNOSIS — K5900 Constipation, unspecified: Secondary | ICD-10-CM | POA: Insufficient documentation

## 2017-08-13 DIAGNOSIS — Z8701 Personal history of pneumonia (recurrent): Secondary | ICD-10-CM | POA: Insufficient documentation

## 2017-08-13 DIAGNOSIS — Z510 Encounter for antineoplastic radiation therapy: Secondary | ICD-10-CM | POA: Diagnosis not present

## 2017-08-13 DIAGNOSIS — Z9221 Personal history of antineoplastic chemotherapy: Secondary | ICD-10-CM | POA: Diagnosis not present

## 2017-08-13 DIAGNOSIS — J984 Other disorders of lung: Secondary | ICD-10-CM

## 2017-08-13 DIAGNOSIS — I313 Pericardial effusion (noninflammatory): Secondary | ICD-10-CM | POA: Diagnosis not present

## 2017-08-13 DIAGNOSIS — J189 Pneumonia, unspecified organism: Secondary | ICD-10-CM

## 2017-08-13 DIAGNOSIS — C7951 Secondary malignant neoplasm of bone: Secondary | ICD-10-CM | POA: Diagnosis not present

## 2017-08-13 DIAGNOSIS — C3411 Malignant neoplasm of upper lobe, right bronchus or lung: Secondary | ICD-10-CM | POA: Insufficient documentation

## 2017-08-13 DIAGNOSIS — Z809 Family history of malignant neoplasm, unspecified: Secondary | ICD-10-CM | POA: Insufficient documentation

## 2017-08-13 DIAGNOSIS — Z79899 Other long term (current) drug therapy: Secondary | ICD-10-CM | POA: Diagnosis not present

## 2017-08-13 DIAGNOSIS — I700 Atherosclerosis of aorta: Secondary | ICD-10-CM | POA: Insufficient documentation

## 2017-08-13 DIAGNOSIS — Z5111 Encounter for antineoplastic chemotherapy: Secondary | ICD-10-CM

## 2017-08-13 MED ORDER — MORPHINE SULFATE ER 30 MG PO TBCR: 30.0000 mg | EXTENDED_RELEASE_TABLET | Freq: Three times a day (TID) | ORAL | 0 refills | Status: DC

## 2017-08-13 MED ORDER — OXYCODONE HCL 5 MG PO TABS: 5.0000 mg | ORAL_TABLET | ORAL | 0 refills | Status: DC | PRN

## 2017-08-13 NOTE — Progress Notes (Signed)
Radiation Oncology         [phone] ________________________________  Name: Edward Spence MRN: [id number]  Date: 08/13/2017  DOB: 1955/10/13  Post Treatment Note  CC: House, [name], FNP  Curt Bears, MD  Diagnosis:   Stage IV (T3,N3,M1b) non-small cell lung cancer, adenocarcinoma presented with right upper lobe lung with disease in the right pelvis.  Interval Since Last Radiation: 13 months  06/09/2016 through 06/30/2016: The patient was treated to the right lung in a palliative manner to a dose of 37.5 gray in 15 fractions. The patient also received 30 gray in 10 fractions to the right pelvis. The right lung was treated with a three-field technique in the right pelvis was treated using a 4 field technique.   Narrative:  In summary this is a pleasant 61 y.o. gentleman with a history of stage IV NSCLC of the RUL. He presented with disease as well in the right pelvis last year. He was treated with [place] to palliate his lung tumor as well as the metastasis to the pelvis and since that time has continued under the care of Dr. Julien Nordmann with active surveillance. He developed progression in the RUL despite consolidation and is planning to begin immunotherapy. In the most recent imaging documenting progression, he has been found to have a sclerotic lesion in the medial right iliac wing measuring 7 cm, and small sclerotic lesion in the L5 vertebral body. He has also has progression in the superior mediastinal and subcarinal nodes. He comes today to consider options of radiotherapy to the right iliac and lumbar spine                      On review of systems, the patient reports progressive low back and right hip pain that radiates into the right groin. It is a gnawing but sometimes sharp and shooting pain. He is taking MSContin and Oxycodone which helps but does not relieve his pain.He denies any chest pain, shortness of breath, cough, fevers, chills, night sweats, unintended weight changes.  He denies any bowel or bladder disturbances, and denies abdominal pain, nausea or vomiting. he denies any new musculoskeletal or joint aches or pains, new skin lesions or concerns. A complete review of systems is obtained and is otherwise negative.   Past Medical History:  Past Medical History:  Diagnosis Date  . Cavitary pneumonia 01/21/2017  . Constipation 07/23/2016  . Encounter for antineoplastic chemotherapy 06/24/2016  . Lung cancer ([place])   . Lung mass 05/19/2016  . Pneumonia    in college    Past Surgical History: Past Surgical History:  Procedure Laterality Date  . APPENDECTOMY    . VIDEO BRONCHOSCOPY Bilateral 02/27/2017   Procedure: VIDEO BRONCHOSCOPY WITH FLUORO;  Surgeon: Marshell Garfinkel, MD;  Location: [facility];  Service: Cardiopulmonary;  Laterality: Bilateral;  . VIDEO BRONCHOSCOPY WITH ENDOBRONCHIAL ULTRASOUND N/A 05/22/2016   Procedure: VIDEO BRONCHOSCOPY WITH ENDOBRONCHIAL ULTRASOUND;  Surgeon: [name], MD;  Location: [facility];  Service: Thoracic;  Laterality: N/A;    Social History:  Social History   Socioeconomic History  . Marital status: Divorced    Spouse name: Not on file  . Number of children: Not on file  . Years of education: Not on file  . Highest education level: Not on file  Social Needs  . Financial resource strain: Not on file  . Food insecurity - worry: Not on file  . Food insecurity - inability: Not on file  . Transportation  needs - medical: Not on file  . Transportation needs - non-medical: Not on file  Occupational History  . Not on file  Tobacco Use  . Smoking status: Current Every Day Smoker    Packs/day: 0.50    Years: 40.00    Pack years: 20.00    Types: Cigarettes    Last attempt to quit: 04/29/2016    Years since quitting: 1.2  . Smokeless tobacco: Never Used  . Tobacco comment: 1-5 per day  Substance and Sexual Activity  . Alcohol use: No  . Drug use: No  . Sexual activity: Not on file  Other Topics Concern  .  Not on file  Social History Narrative  . Not on file  The patient is married and lives in [place].  Family History: Family History  Problem Relation Age of Onset  . Cancer Mother   . COPD Father   . Heart failure Father      ALLERGIES:  is allergic to penicillins.  Meds: Current Outpatient Medications  Medication Sig Dispense Refill  . albuterol (PROVENTIL HFA;VENTOLIN HFA) 108 (90 Base) MCG/ACT inhaler Inhale 2 puffs into the lungs every 6 (six) hours as needed for wheezing or shortness of breath. 1 Inhaler 2  . morphine (MS CONTIN) 30 MG 12 hr tablet Take 1 tablet (30 mg total) 3 (three) times daily by mouth. 90 tablet 0  . oxyCODONE (OXY IR/ROXICODONE) 5 MG immediate release tablet Take 1-2 tablets (5-10 mg total) every 4 (four) hours as needed by mouth for severe pain. 60 tablet 0  . Respiratory Therapy Supplies (FLUTTER) DEVI USE AS DIRECTED 1 each 0   No current facility-administered medications for this encounter.     Physical Findings:  height is 6\' 3"  (1.905 m) and weight is 162 lb 12.8 oz (73.8 kg). His oral temperature is 98.9 F (37.2 C). His blood pressure is 91/63 and his pulse is 104 (abnormal). His respiration is 18.   Pain Assessment Pain Score: 4  Pain Loc: Back/10  In general this is a well appearing caucasian male in no acute distress. He's alert and oriented x4 and appropriate throughout the examination. Cardiovascular exam reveals a regular rate and rhythm. No clicks, rubs, or murmurs are noted. Chest is clear to auscultation bilaterally. No lower extremity edema is noted.  Lab Findings: Lab Results  Component Value Date   WBC 5.2 08/06/2017   HGB 12.7 (L) 08/06/2017   HCT 39.9 08/06/2017   MCV 90.3 08/06/2017   PLT 189 08/06/2017     Radiographic Findings: Ct Chest W Contrast  Result Date: 08/06/2017 CLINICAL DATA:  Patient with history of lung cancer and osseous metastatic disease. Follow-up evaluation. EXAM: CT CHEST, ABDOMEN, AND PELVIS  WITH CONTRAST TECHNIQUE: Multidetector CT imaging of the chest, abdomen and pelvis was performed following the standard protocol during bolus administration of intravenous contrast. CONTRAST:  100 cc Isovue-300 COMPARISON:  CT CAP 05/04/2017. FINDINGS: CT CHEST FINDINGS Cardiovascular: Normal heart size. Stable moderate pericardial effusion. Mediastinum/Nodes: Interval increase in size of right peritracheal lymph node measuring 1.5 cm (image 20; series 2), previously 0.9 cm. Slight interval increase in size of subcarinal lymph node measuring 10 mm (image 31; series 2), previously 8 mm. Additionally there is a new 9 mm subcarinal lymph node with central low density (image 28; series 2). Lungs/Pleura: Central airways are patent. Grossly unchanged cavitary structure within the right upper lobe with surrounding confluent soft tissue, bronchiectasis and volume loss. Slight interval increase in subpleural reticular consolidative  opacities within the medial right lower lobe (image 63; series 4). Interval increase in size of 7 mm subpleural left lower lobe nodule (image 93; series 4, previously 3 mm. Musculoskeletal: No aggressive or acute appearing osseous lesions. CT ABDOMEN PELVIS FINDINGS Hepatobiliary: Liver is normal in size and contour. Gallbladder is unremarkable. No intrahepatic or extrahepatic biliary ductal dilatation. Pancreas: Unremarkable Spleen: Unremarkable Adrenals/Urinary Tract: Normal adrenal glands. Kidneys enhance symmetrically with contrast. No hydronephrosis. Stable subcentimeter bilateral low-attenuation renal lesions, too small to characterize. Urinary bladder is unremarkable. Stomach/Bowel: Stool throughout the colon. No abnormal bowel wall thickening or evidence for bowel obstruction. No free fluid or free intraperitoneal air. Normal morphology of the stomach. Vascular/Lymphatic: Normal caliber abdominal aorta. Peripheral calcified atherosclerotic plaque. No retroperitoneal lymphadenopathy.  Reproductive: Prostate is unremarkable. Other: None. Musculoskeletal: Sclerotic lesion within the medial right iliac wing adjacent to the SI joint is unchanged measuring approximately 7 cm (image 109; series 2). Unchanged small sclerotic lesion within the L5 vertebral body. Thoracic and lumbar spine degenerative changes. IMPRESSION: 1. Interval increase in size of superior mediastinal and subcarinal adenopathy. 2. Interval increase in size of 7 mm subpleural left lower lobe pulmonary nodule. 3. Interval development of subpleural ground-glass and consolidative opacities within the right lower lobe, potentially post radiation changes. Recommend attention on follow-up. 4. Grossly unchanged cavitary structure within the right upper lung which may represent evolving radiation changes. Recommend attention on follow-up. 5. Unchanged sclerotic lesions within the right iliac wing and L5 vertebral body. 6. Persistent moderate pericardial effusion. 7.  Aortic Atherosclerosis (ICD10-I70.0). Electronically Signed   By: Lovey Newcomer M.D.   On: 08/06/2017 15:30   Ct Abdomen Pelvis W Contrast  Result Date: 08/06/2017 CLINICAL DATA:  Patient with history of lung cancer and osseous metastatic disease. Follow-up evaluation. EXAM: CT CHEST, ABDOMEN, AND PELVIS WITH CONTRAST TECHNIQUE: Multidetector CT imaging of the chest, abdomen and pelvis was performed following the standard protocol during bolus administration of intravenous contrast. CONTRAST:  100 cc Isovue-300 COMPARISON:  CT CAP 05/04/2017. FINDINGS: CT CHEST FINDINGS Cardiovascular: Normal heart size. Stable moderate pericardial effusion. Mediastinum/Nodes: Interval increase in size of right peritracheal lymph node measuring 1.5 cm (image 20; series 2), previously 0.9 cm. Slight interval increase in size of subcarinal lymph node measuring 10 mm (image 31; series 2), previously 8 mm. Additionally there is a new 9 mm subcarinal lymph node with central low density (image 28;  series 2). Lungs/Pleura: Central airways are patent. Grossly unchanged cavitary structure within the right upper lobe with surrounding confluent soft tissue, bronchiectasis and volume loss. Slight interval increase in subpleural reticular consolidative opacities within the medial right lower lobe (image 63; series 4). Interval increase in size of 7 mm subpleural left lower lobe nodule (image 93; series 4, previously 3 mm. Musculoskeletal: No aggressive or acute appearing osseous lesions. CT ABDOMEN PELVIS FINDINGS Hepatobiliary: Liver is normal in size and contour. Gallbladder is unremarkable. No intrahepatic or extrahepatic biliary ductal dilatation. Pancreas: Unremarkable Spleen: Unremarkable Adrenals/Urinary Tract: Normal adrenal glands. Kidneys enhance symmetrically with contrast. No hydronephrosis. Stable subcentimeter bilateral low-attenuation renal lesions, too small to characterize. Urinary bladder is unremarkable. Stomach/Bowel: Stool throughout the colon. No abnormal bowel wall thickening or evidence for bowel obstruction. No free fluid or free intraperitoneal air. Normal morphology of the stomach. Vascular/Lymphatic: Normal caliber abdominal aorta. Peripheral calcified atherosclerotic plaque. No retroperitoneal lymphadenopathy. Reproductive: Prostate is unremarkable. Other: None. Musculoskeletal: Sclerotic lesion within the medial right iliac wing adjacent to the SI joint is unchanged measuring approximately 7  cm (image 109; series 2). Unchanged small sclerotic lesion within the L5 vertebral body. Thoracic and lumbar spine degenerative changes. IMPRESSION: 1. Interval increase in size of superior mediastinal and subcarinal adenopathy. 2. Interval increase in size of 7 mm subpleural left lower lobe pulmonary nodule. 3. Interval development of subpleural ground-glass and consolidative opacities within the right lower lobe, potentially post radiation changes. Recommend attention on follow-up. 4. Grossly  unchanged cavitary structure within the right upper lung which may represent evolving radiation changes. Recommend attention on follow-up. 5. Unchanged sclerotic lesions within the right iliac wing and L5 vertebral body. 6. Persistent moderate pericardial effusion. 7.  Aortic Atherosclerosis (ICD10-I70.0). Electronically Signed   By: Lovey Newcomer M.D.   On: 08/06/2017 15:30    Impression/Plan: 1. Stage IV (T3,N3,M1b) non-small cell lung cancer, adenocarcinoma presented with right upper lobe lung with disease in the right pelvis. Dr. Lisbeth Renshaw discusses the pathology findings and reviews the nature of bony disease from lung cancer. Dr. Lisbeth Renshaw would recommend a course of 10 fractions to L5 and right iliac wing. We discussed the risks, benefits, short, and long term effects of radiotherapy, and the patient is interested in proceeding. He will simulate today. Written consent is obtained and placed in the chart, a copy was provided to the patient. He will start immunotherapy as well.   In a visit lasting 25 minutes, greater than 50% of the time was spent face to face discussing his radiographic findings, and coordinating the patient's care.  The above documentation reflects my direct findings during this shared patient visit. Please see the separate note by Dr. Lisbeth Renshaw on this date for the remainder of the patient's plan of care.     Carola Rhine, PAC

## 2017-08-13 NOTE — Progress Notes (Signed)
  Radiation Oncology         [phone] ________________________________  Name: Edward Spence MRN: [id number]  Date: 08/13/2017  DOB: 11-04-1955  SIMULATION AND TREATMENT PLANNING NOTE  DIAGNOSIS:     ICD-10-CM   1. 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  Identity was confirmed.  All relevant records and images related to the planned course of therapy were reviewed.   Written consent to proceed with treatment was confirmed which was freely given after reviewing the details related to the planned course of therapy had been reviewed with the patient.  Then, the patient was set-up in a stable reproducible  supine position for radiation therapy.  CT images were obtained.  Surface markings were placed.    Medically necessary complex treatment device(s) for immobilization: Customized Vac-Lok bag.   The CT images were loaded into the planning software.  Then the target and avoidance structures were contoured.  Treatment planning then occurred.  The radiation prescription was entered and confirmed.  A total of 3 complex treatment devices were fabricated which relate to the designed radiation treatment fields. Each of these customized fields/ complex treatment devices will be used on a daily basis during the radiation course. I have requested : Isodose Plan.   PLAN:  The patient will receive 30 Gy in 10 fractions.    Special treatment procedure The patient has received radiation treatment to the right pelvis in the past.  The current area of concern overlaps this treatment area and will represent a course of reirradiation.  This therefore will be accounted for in the treatment planning and we will review the previous treatment as well to accommodate this.  This therefore represents a special treatment procedure.  ________________________________   Jodelle Gross, MD, PhD

## 2017-08-14 DIAGNOSIS — Z510 Encounter for antineoplastic radiation therapy: Secondary | ICD-10-CM | POA: Diagnosis not present

## 2017-08-15 ENCOUNTER — Telehealth: Payer: Self-pay | Admitting: Internal Medicine

## 2017-08-15 NOTE — Telephone Encounter (Signed)
Scheduled appt per 11/12 los - sent reminder letter in the mail with appt date and time .

## 2017-08-17 ENCOUNTER — Ambulatory Visit
Admission: RE | Admit: 2017-08-17 | Discharge: 2017-08-17 | Disposition: A | Discharge status: Home / Self Care | Payer: Medicaid Other | Source: Ambulatory Visit | Attending: Radiation Oncology | Admitting: Radiation Oncology

## 2017-08-17 DIAGNOSIS — Z510 Encounter for antineoplastic radiation therapy: Secondary | ICD-10-CM | POA: Diagnosis not present

## 2017-08-18 ENCOUNTER — Ambulatory Visit
Admission: RE | Admit: 2017-08-18 | Discharge: 2017-08-18 | Disposition: A | Discharge status: Home / Self Care | Payer: Medicaid Other | Source: Ambulatory Visit | Attending: Radiation Oncology | Admitting: Radiation Oncology

## 2017-08-18 DIAGNOSIS — Z510 Encounter for antineoplastic radiation therapy: Secondary | ICD-10-CM | POA: Diagnosis not present

## 2017-08-19 ENCOUNTER — Ambulatory Visit
Admission: RE | Admit: 2017-08-19 | Discharge: 2017-08-19 | Disposition: A | Discharge status: Home / Self Care | Payer: Medicaid Other | Source: Ambulatory Visit | Attending: Radiation Oncology | Admitting: Radiation Oncology

## 2017-08-19 DIAGNOSIS — Z510 Encounter for antineoplastic radiation therapy: Secondary | ICD-10-CM | POA: Diagnosis not present

## 2017-08-24 ENCOUNTER — Other Ambulatory Visit: Payer: Self-pay | Admitting: Radiation Oncology

## 2017-08-24 ENCOUNTER — Ambulatory Visit
Admission: RE | Admit: 2017-08-24 | Discharge: 2017-08-24 | Disposition: A | Discharge status: Home / Self Care | Payer: Medicaid Other | Source: Ambulatory Visit | Attending: Radiation Oncology | Admitting: Radiation Oncology

## 2017-08-24 ENCOUNTER — Telehealth: Payer: Self-pay

## 2017-08-24 ENCOUNTER — Encounter: Payer: Self-pay | Admitting: *Deleted

## 2017-08-24 DIAGNOSIS — Z510 Encounter for antineoplastic radiation therapy: Secondary | ICD-10-CM | POA: Diagnosis not present

## 2017-08-24 DIAGNOSIS — J189 Pneumonia, unspecified organism: Secondary | ICD-10-CM

## 2017-08-24 DIAGNOSIS — Z5111 Encounter for antineoplastic chemotherapy: Secondary | ICD-10-CM

## 2017-08-24 DIAGNOSIS — J984 Other disorders of lung: Secondary | ICD-10-CM

## 2017-08-24 DIAGNOSIS — C3411 Malignant neoplasm of upper lobe, right bronchus or lung: Secondary | ICD-10-CM

## 2017-08-24 MED ORDER — OXYCODONE HCL 15 MG PO TABS: 15.0000 mg | ORAL_TABLET | ORAL | 0 refills | Status: DC | PRN

## 2017-08-24 MED ORDER — MORPHINE SULFATE ER 60 MG PO TBCR: 60.0000 mg | EXTENDED_RELEASE_TABLET | Freq: Three times a day (TID) | ORAL | 0 refills | Status: DC

## 2017-08-24 NOTE — Telephone Encounter (Signed)
Pt needs rx for a cane please

## 2017-08-24 NOTE — Progress Notes (Signed)
Written rx for 4 point cane given to patient, he asked to see [name], for stronger pain RX, MS contin and OXY IR written by her 08/13/17 not helping, he is taking them together stated,not helping, couldn't hardly  Move yesterday 3:45 PM

## 2017-08-24 NOTE — Telephone Encounter (Signed)
Pt called that over the weekend his hip and low back pain has escalated. He was barely able to get off commode or out of bed or chair. He is requesting an rx for a cane. He states that his MS contin and oxycodone "are just about gone"  S/w Dr Julien Nordmann and he requested pt speak with rad onc since he is under active treatment.  Pt has xrt appt at 345 today.

## 2017-08-25 ENCOUNTER — Ambulatory Visit (HOSPITAL_BASED_OUTPATIENT_CLINIC_OR_DEPARTMENT_OTHER): Payer: Medicaid Other

## 2017-08-25 ENCOUNTER — Encounter: Payer: Self-pay | Admitting: Oncology

## 2017-08-25 ENCOUNTER — Ambulatory Visit (HOSPITAL_BASED_OUTPATIENT_CLINIC_OR_DEPARTMENT_OTHER): Payer: Medicaid Other | Admitting: Oncology

## 2017-08-25 ENCOUNTER — Ambulatory Visit
Admission: RE | Admit: 2017-08-25 | Discharge: 2017-08-25 | Disposition: A | Discharge status: Home / Self Care | Payer: Medicaid Other | Source: Ambulatory Visit | Attending: Radiation Oncology | Admitting: Radiation Oncology

## 2017-08-25 ENCOUNTER — Encounter: Payer: Self-pay | Admitting: Radiation Oncology

## 2017-08-25 ENCOUNTER — Other Ambulatory Visit (HOSPITAL_BASED_OUTPATIENT_CLINIC_OR_DEPARTMENT_OTHER): Payer: Medicaid Other

## 2017-08-25 VITALS — BP 96/59 | HR 92 | Temp 98.7°F | Resp 18 | Ht 75.0 in | Wt 164.5 lb

## 2017-08-25 DIAGNOSIS — Z5112 Encounter for antineoplastic immunotherapy: Secondary | ICD-10-CM | POA: Diagnosis present

## 2017-08-25 DIAGNOSIS — C7951 Secondary malignant neoplasm of bone: Secondary | ICD-10-CM

## 2017-08-25 DIAGNOSIS — D702 Other drug-induced agranulocytosis: Secondary | ICD-10-CM

## 2017-08-25 DIAGNOSIS — C778 Secondary and unspecified malignant neoplasm of lymph nodes of multiple regions: Secondary | ICD-10-CM

## 2017-08-25 DIAGNOSIS — Z5111 Encounter for antineoplastic chemotherapy: Secondary | ICD-10-CM

## 2017-08-25 DIAGNOSIS — C3411 Malignant neoplasm of upper lobe, right bronchus or lung: Secondary | ICD-10-CM

## 2017-08-25 DIAGNOSIS — G893 Neoplasm related pain (acute) (chronic): Secondary | ICD-10-CM | POA: Diagnosis not present

## 2017-08-25 DIAGNOSIS — C7802 Secondary malignant neoplasm of left lung: Secondary | ICD-10-CM

## 2017-08-25 DIAGNOSIS — Z510 Encounter for antineoplastic radiation therapy: Secondary | ICD-10-CM | POA: Diagnosis not present

## 2017-08-25 LAB — CBC WITH DIFFERENTIAL/PLATELET
BASO%: 0.7 % (ref 0.0–2.0)
BASOS ABS: 0 10*3/uL (ref 0.0–0.1)
EOS%: 2.8 % (ref 0.0–7.0)
Eosinophils Absolute: 0.1 10*3/uL (ref 0.0–0.5)
HCT: 36.6 % — ABNORMAL LOW (ref 38.4–49.9)
HEMOGLOBIN: 11.8 g/dL — AB (ref 13.0–17.1)
LYMPH%: 13.6 % — AB (ref 14.0–49.0)
MCH: 28.4 pg (ref 27.2–33.4)
MCHC: 32.3 g/dL (ref 32.0–36.0)
MCV: 87.8 fL (ref 79.3–98.0)
MONO#: 0.4 10*3/uL (ref 0.1–0.9)
MONO%: 9.3 % (ref 0.0–14.0)
NEUT#: 3.6 10*3/uL (ref 1.5–6.5)
NEUT%: 73.6 % (ref 39.0–75.0)
Platelets: 206 10*3/uL (ref 140–400)
RBC: 4.17 10*6/uL — AB (ref 4.20–5.82)
RDW: 17.2 % — AB (ref 11.0–14.6)
WBC: 4.8 10*3/uL (ref 4.0–10.3)
lymph#: 0.7 10*3/uL — ABNORMAL LOW (ref 0.9–3.3)

## 2017-08-25 LAB — COMPREHENSIVE METABOLIC PANEL
ALBUMIN: 3.2 g/dL — AB (ref 3.5–5.0)
ALT: 7 U/L (ref 0–55)
AST: 11 U/L (ref 5–34)
Alkaline Phosphatase: 57 U/L (ref 40–150)
Anion Gap: 10 mEq/L (ref 3–11)
BUN: 10.1 mg/dL (ref 7.0–26.0)
CHLORIDE: 106 meq/L (ref 98–109)
CO2: 24 mEq/L (ref 22–29)
Calcium: 9.7 mg/dL (ref 8.4–10.4)
Creatinine: 0.7 mg/dL (ref 0.7–1.3)
GLUCOSE: 86 mg/dL (ref 70–140)
POTASSIUM: 4.1 meq/L (ref 3.5–5.1)
SODIUM: 139 meq/L (ref 136–145)
Total Bilirubin: 0.36 mg/dL (ref 0.20–1.20)
Total Protein: 7.3 g/dL (ref 6.4–8.3)

## 2017-08-25 LAB — UA PROTEIN, DIPSTICK - CHCC: Protein, ur: NEGATIVE mg/dL

## 2017-08-25 MED ORDER — DENOSUMAB 120 MG/1.7ML ~~LOC~~ SOLN
120.0000 mg | Freq: Once | SUBCUTANEOUS | Status: AC
  Administered 2017-08-25: 120 mg via SUBCUTANEOUS
  Filled 2017-08-25: qty 1.7

## 2017-08-25 MED ORDER — SODIUM CHLORIDE 0.9 % IV SOLN
480.0000 mg | Freq: Once | INTRAVENOUS | Status: AC
  Administered 2017-08-25: 480 mg via INTRAVENOUS
  Filled 2017-08-25: qty 48

## 2017-08-25 MED ORDER — SODIUM CHLORIDE 0.9 % IV SOLN
Freq: Once | INTRAVENOUS | Status: AC
  Administered 2017-08-25: 15:00:00 via INTRAVENOUS

## 2017-08-25 NOTE — Patient Instructions (Addendum)
[facility] Discharge Instructions for Patients Receiving Chemotherapy  Today you received the following chemotherapy agents Nivolumab  To help prevent nausea and vomiting after your treatment, we encourage you to take your nausea medication as needed   If you develop nausea and vomiting that is not controlled by your nausea medication, call the clinic.   BELOW ARE SYMPTOMS THAT SHOULD BE REPORTED IMMEDIATELY:  *FEVER GREATER THAN 100.5 F  *CHILLS WITH OR WITHOUT FEVER  NAUSEA AND VOMITING THAT IS NOT CONTROLLED WITH YOUR NAUSEA MEDICATION  *UNUSUAL SHORTNESS OF BREATH  *UNUSUAL BRUISING OR BLEEDING  TENDERNESS IN MOUTH AND THROAT WITH OR WITHOUT PRESENCE OF ULCERS  *URINARY PROBLEMS  *BOWEL PROBLEMS  UNUSUAL RASH Items with * indicate a potential emergency and should be followed up as soon as possible.  Feel free to call the clinic should you have any questions or concerns. The clinic phone number is [phone].  Please show the [facility] at check-in to the Emergency Department and triage nurse.  Denosumab injection What is this medicine? DENOSUMAB (den oh sue mab) slows bone breakdown. Prolia is used to treat osteoporosis in women after menopause and in men. Delton See is used to treat a high calcium level due to cancer and to prevent bone fractures and other bone problems caused by multiple myeloma or cancer bone metastases. Delton See is also used to treat giant cell tumor of the bone. This medicine may be used for other purposes; ask your health care provider or pharmacist if you have questions. COMMON BRAND NAME(S): Prolia, XGEVA What should I tell my health care provider before I take this medicine? They need to know if you have any of these conditions: -dental disease -having surgery or tooth extraction -infection -kidney disease -low levels of calcium or Vitamin D in the blood -malnutrition -on hemodialysis -skin conditions or  sensitivity -thyroid or parathyroid disease -an unusual reaction to denosumab, other medicines, foods, dyes, or preservatives -pregnant or trying to get pregnant -breast-feeding How should I use this medicine? This medicine is for injection under the skin. It is given by a health care professional in a hospital or clinic setting. If you are getting Prolia, a special MedGuide will be given to you by the pharmacist with each prescription and refill. Be sure to read this information carefully each time. For Prolia, talk to your pediatrician regarding the use of this medicine in children. Special care may be needed. For Delton See, talk to your pediatrician regarding the use of this medicine in children. While this drug may be prescribed for children as young as 13 years for selected conditions, precautions do apply. Overdosage: If you think you have taken too much of this medicine contact a poison control center or emergency room at once. NOTE: This medicine is only for you. Do not share this medicine with others. What if I miss a dose? It is important not to miss your dose. Call your doctor or health care professional if you are unable to keep an appointment. What may interact with this medicine? Do not take this medicine with any of the following medications: -other medicines containing denosumab This medicine may also interact with the following medications: -medicines that lower your chance of fighting infection -steroid medicines like prednisone or cortisone This list may not describe all possible interactions. Give your health care provider a list of all the medicines, herbs, non-prescription drugs, or dietary supplements you use. Also tell them if you smoke, drink alcohol, or use illegal  drugs. Some items may interact with your medicine. What should I watch for while using this medicine? Visit your doctor or health care professional for regular checks on your progress. Your doctor or health care  professional may order blood tests and other tests to see how you are doing. Call your doctor or health care professional for advice if you get a fever, chills or sore throat, or other symptoms of a cold or flu. Do not treat yourself. This drug may decrease your body's ability to fight infection. Try to avoid being around people who are sick. You should make sure you get enough calcium and vitamin D while you are taking this medicine, unless your doctor tells you not to. Discuss the foods you eat and the vitamins you take with your health care professional. See your dentist regularly. Brush and floss your teeth as directed. Before you have any dental work done, tell your dentist you are receiving this medicine. Do not become pregnant while taking this medicine or for 5 months after stopping it. Talk with your doctor or health care professional about your birth control options while taking this medicine. Women should inform their doctor if they wish to become pregnant or think they might be pregnant. There is a potential for serious side effects to an unborn child. Talk to your health care professional or pharmacist for more information. What side effects may I notice from receiving this medicine? Side effects that you should report to your doctor or health care professional as soon as possible: -allergic reactions like skin rash, itching or hives, swelling of the face, lips, or tongue -bone pain -breathing problems -dizziness -jaw pain, especially after dental work -redness, blistering, peeling of the skin -signs and symptoms of infection like fever or chills; cough; sore throat; pain or trouble passing urine -signs of low calcium like fast heartbeat, muscle cramps or muscle pain; pain, tingling, numbness in the hands or feet; seizures -unusual bleeding or bruising -unusually weak or tired Side effects that usually do not require medical attention (report to your doctor or health care professional  if they continue or are bothersome): -constipation -diarrhea -headache -joint pain -loss of appetite -muscle pain -runny nose -tiredness -upset stomach This list may not describe all possible side effects. Call your doctor for medical advice about side effects. You may report side effects to FDA at 1-800-FDA-1088. Where should I keep my medicine? This medicine is only given in a clinic, doctor's office, or other health care setting and will not be stored at home. NOTE: This sheet is a summary. It may not cover all possible information. If you have questions about this medicine, talk to your doctor, pharmacist, or health care provider.  2018 Elsevier/Gold Standard (2016-10-07 19:17:21)  

## 2017-08-25 NOTE — Progress Notes (Signed)
I saw the patient for an unscheduled evaluation due to progressive right hip pain. He has received 4 fractions to the right pelvis, and has not noticed improvement in pain. He's been taking MSContin 30 mg TID and Oxycodone 5-10 mg  3-6 hours prn. He admits that the oxycodone helps but he's having to take more of it and more frequently than prescribed. 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 He is concerned about addiction with his pain medication but is clearly in significant pain. He requests a cane to help with the patient that is due to his right pelvic metastases. After reviewing his options and him being honest of taking 2 MSContin tablets at a time, I feel that he has poor long acting relief, as well as high tolerance and need to increase his short acting. We reviewed the concerns of dependance versus addiction, and in this situation I do not feel that there is any mal-intent for his pain relief. He is aware to let me know if he feels that his motivations toward taking this differently than prescribed arise. For now we will increase his long acting which he's been using TID off label, to 60 mg TID, particularly since he's already made these adjustments at home without significant relief. I will increase his short acting oxycodone to 15 mg as well since he's also tried this somewhat previously. Hopefully this will allow for better control. He understands the risks of increasing narcotics on his own and from now on will contact me while he is under our care for direction. Once his treatment has showing improvement in pain, we anticipate cutting these meds back significantly.    Carola Rhine, PAC

## 2017-08-25 NOTE — Assessment & Plan Note (Signed)
This is a very pleasant 61 year old white male with a stage IV non-small cell lung cancer, adenocarcinoma status post 6 cycles of induction systemic chemotherapy with carbo platinum, Alimta and Avastin with partial response. This was followed by maintenance treatment with Alimta and Avastin for 3 cycles and his treatment was discontinued secondary to development of cavitary pneumonia in the right upper lobe. The patient was then on observation. He had repeat CT scan of the chest, abdomen and pelvis that showed mild increase in the size of the superior mediastinal and subcarinal lymphadenopathy as well as a subpleural left lower lobe pulmonary nodule.  He is now here to begin second line immunotherapy with Nivolumab 480 mg IV every 4 weeks. I discussed with the patient adverse effect of this treatment including but not limited to immunotherapy mediated skin rash, diarrhea, inflammation of the lung, kidney, liver, thyroid or other endocrine dysfunction.  He will proceed with cycle 1 as scheduled today.    For his back pain, he will continue on radiation.  I have encouraged him to begin the higher doses of pain medication that were prescribed to him yesterday.  He will also resume his treatment with Xgeva every 4 weeks.    The patient will come back for follow-up visit in 4 weeks for evaluation prior to cycle 2 of his immunotherapy.  He was advised to call immediately if he has any concerning symptoms in the interval. The patient voices understanding of current disease status and treatment options and is in agreement with the current care plan. All questions were answered. The patient knows to call the clinic with any problems, questions or concerns. We can certainly see the patient much sooner if necessary.

## 2017-08-25 NOTE — Progress Notes (Signed)
[place], FNP [address] [place] [address]  DIAGNOSIS: Stage IV (T3, N3, M1b) non-small cell lung cancer, adenocarcinoma diagnosed in August 2017 and presented with right upper lobe lung nodule as well as large right superior mediastinal and right paratracheal lymphadenopathy and bone metastases in the right iliac and L5 vertebral body.  GUARDANT 360: Negative for EGFR, ALK, ROS 1, BRAF mutations but showed amplification of EGFR and MET  PRIOR THERAPY: 1) Palliative systemic chemotherapy to the right superior mediastinal and right paratracheal lymphadenopathy under the care of Dr. Lisbeth Renshaw. 2) Systemic chemotherapy with carboplatin for AUC of 5, Alimta 500 MG/M2 and Avastin 15 MG/KG every 3 weeks is status post 6 cycles. First cycle was given on 07/28/2016.  3) Maintenance systemic chemotherapy with Alimta 500 MG/M2 and Avastin 15 MG/KG every 3 weeks. First dose 11/19/2016. Status post 3 cycles. Avastin was discontinued after cycle #3 secondary to development of right upper lobe cavitary lesion. Treatment is currently on hold secondary to the cavitary lesion in the right upper lobe.  CURRENT THERAPY: Second line treatment with immunotherapy was Nivolumab 480 mg IV every 4 weeks.  First cycle August 25, 2017.  INTERVAL HISTORY: Edward Spence 61 y.o. male returns for routine follow-up visit by himself.  The patient is feeling fine except for ongoing persistent back pain.  He is currently receiving radiation to this area.  His MS Contin dose and oxycodone doses were increased yesterday.  He has not yet picked up these prescriptions.  The patient denies fevers and chills.  Denies chest pain, shortness breath, cough, hemoptysis.  Denies nausea, vomiting, constipation, diarrhea.  The patient is here for evaluation prior to cycle 1 of his Nivolumab.  MEDICAL HISTORY: Past Medical History:  Diagnosis Date  . Cavitary pneumonia 01/21/2017  .  Constipation 07/23/2016  . Encounter for antineoplastic chemotherapy 06/24/2016  . Lung cancer ([place])   . Lung mass 05/19/2016  . Pneumonia    in college    ALLERGIES:  is allergic to penicillins.  MEDICATIONS:  Current Outpatient Medications  Medication Sig Dispense Refill  . albuterol (PROVENTIL HFA;VENTOLIN HFA) 108 (90 Base) MCG/ACT inhaler Inhale 2 puffs into the lungs every 6 (six) hours as needed for wheezing or shortness of breath. 1 Inhaler 2  . morphine (MS CONTIN) 60 MG 12 hr tablet Take 1 tablet (60 mg total) by mouth 3 (three) times daily. 90 tablet 0  . oxyCODONE (ROXICODONE) 15 MG immediate release tablet Take 1 tablet (15 mg total) by mouth every 4 (four) hours as needed for severe pain. 120 tablet 0  . Respiratory Therapy Supplies (FLUTTER) DEVI USE AS DIRECTED 1 each 0   No current facility-administered medications for this visit.    Facility-Administered Medications Ordered in Other Visits  Medication Dose Route Frequency Provider Last Rate Last Dose  . denosumab (XGEVA) injection 120 mg  120 mg Subcutaneous Once Curt Bears, MD      . nivolumab (OPDIVO) 480 mg in sodium chloride 0.9 % 100 mL chemo infusion  480 mg Intravenous Once Curt Bears, MD        SURGICAL HISTORY:  Past Surgical History:  Procedure Laterality Date  . APPENDECTOMY    . VIDEO BRONCHOSCOPY Bilateral 02/27/2017   Procedure: VIDEO BRONCHOSCOPY WITH FLUORO;  Surgeon: Marshell Garfinkel, MD;  Location: [facility];  Service: Cardiopulmonary;  Laterality: Bilateral;  . VIDEO BRONCHOSCOPY WITH ENDOBRONCHIAL ULTRASOUND N/A 05/22/2016   Procedure: VIDEO BRONCHOSCOPY WITH  ENDOBRONCHIAL ULTRASOUND;  Surgeon: [name], MD;  Location: [facility];  Service: Thoracic;  Laterality: N/A;    REVIEW OF SYSTEMS:   Review of Systems  Constitutional: Negative for appetite change, chills, fatigue, fever and unexpected weight change.  HENT:   Negative for mouth sores, nosebleeds, sore throat and  trouble swallowing.   Eyes: Negative for eye problems and icterus.  Respiratory: Negative for cough, hemoptysis, shortness of breath and wheezing.   Cardiovascular: Negative for chest pain and leg swelling.  Gastrointestinal: Negative for abdominal pain, constipation, diarrhea, nausea and vomiting.  Genitourinary: Negative for bladder incontinence, difficulty urinating, dysuria, frequency and hematuria.   Musculoskeletal: Negative for gait problem, neck pain and neck stiffness. Positive for back pain. Skin: Negative for itching and rash.  Neurological: Negative for dizziness, extremity weakness, gait problem, headaches, light-headedness and seizures.  Hematological: Negative for adenopathy. Does not bruise/bleed easily.  Psychiatric/Behavioral: Negative for confusion, depression and sleep disturbance. The patient is not nervous/anxious.     PHYSICAL EXAMINATION:  Blood pressure (!) 96/59, pulse 92, temperature 98.7 F (37.1 C), temperature source Oral, resp. rate 18, height _0  (1.905 m), weight 164 lb 8 oz (74.6 kg), SpO2 100 %.  ECOG PERFORMANCE STATUS: 1 - Symptomatic but completely ambulatory  Physical Exam  Constitutional: Oriented to person, place, and time and well-developed, well-nourished, and in no distress. No distress.  HENT:  Head: Normocephalic and atraumatic.  Mouth/Throat: Oropharynx is clear and moist. No oropharyngeal exudate.  Eyes: Conjunctivae are normal. Right eye exhibits no discharge. Left eye exhibits no discharge. No scleral icterus.  Neck: Normal range of motion. Neck supple.  Cardiovascular: Normal rate, regular rhythm, normal heart sounds and intact distal pulses.   Pulmonary/Chest: Effort normal and breath sounds normal. No respiratory distress. No wheezes. No rales.  Abdominal: Soft. Bowel sounds are normal. Exhibits no distension and no mass. There is no tenderness.  Musculoskeletal: Normal range of motion. Exhibits no edema.  Lymphadenopathy:    No  cervical adenopathy.  Neurological: Alert and oriented to person, place, and time. Exhibits normal muscle tone. Gait normal. Coordination normal.  Skin: Skin is warm and dry. No rash noted. Not diaphoretic. No erythema. No pallor.  Psychiatric: Mood, memory and judgment normal.  Vitals reviewed.  LABORATORY DATA: Lab Results  Component Value Date   WBC 4.8 08/25/2017   HGB 11.8 (L) 08/25/2017   HCT 36.6 (L) 08/25/2017   MCV 87.8 08/25/2017   PLT 206 08/25/2017      Chemistry      Component Value Date/Time   NA 139 08/25/2017 1316   K 4.1 08/25/2017 1316   CL 107 05/22/2016 0641   CO2 24 08/25/2017 1316   BUN 10.1 08/25/2017 1316   CREATININE 0.7 08/25/2017 1316      Component Value Date/Time   CALCIUM 9.7 08/25/2017 1316   ALKPHOS 57 08/25/2017 1316   AST 11 08/25/2017 1316   ALT 7 08/25/2017 1316   BILITOT 0.36 08/25/2017 1316       RADIOGRAPHIC STUDIES:  Ct Chest W Contrast  Result Date: 08/06/2017 CLINICAL DATA:  Patient with history of lung cancer and osseous metastatic disease. Follow-up evaluation. EXAM: CT CHEST, ABDOMEN, AND PELVIS WITH CONTRAST TECHNIQUE: Multidetector CT imaging of the chest, abdomen and pelvis was performed following the standard protocol during bolus administration of intravenous contrast. CONTRAST:  100 cc Isovue-300 COMPARISON:  CT CAP 05/04/2017. FINDINGS: CT CHEST FINDINGS Cardiovascular: Normal heart size. Stable moderate pericardial effusion. Mediastinum/Nodes: Interval increase in  size of right peritracheal lymph node measuring 1.5 cm (image 20; series 2), previously 0.9 cm. Slight interval increase in size of subcarinal lymph node measuring 10 mm (image 31; series 2), previously 8 mm. Additionally there is a new 9 mm subcarinal lymph node with central low density (image 28; series 2). Lungs/Pleura: Central airways are patent. Grossly unchanged cavitary structure within the right upper lobe with surrounding confluent soft tissue,  bronchiectasis and volume loss. Slight interval increase in subpleural reticular consolidative opacities within the medial right lower lobe (image 63; series 4). Interval increase in size of 7 mm subpleural left lower lobe nodule (image 93; series 4, previously 3 mm. Musculoskeletal: No aggressive or acute appearing osseous lesions. CT ABDOMEN PELVIS FINDINGS Hepatobiliary: Liver is normal in size and contour. Gallbladder is unremarkable. No intrahepatic or extrahepatic biliary ductal dilatation. Pancreas: Unremarkable Spleen: Unremarkable Adrenals/Urinary Tract: Normal adrenal glands. Kidneys enhance symmetrically with contrast. No hydronephrosis. Stable subcentimeter bilateral low-attenuation renal lesions, too small to characterize. Urinary bladder is unremarkable. Stomach/Bowel: Stool throughout the colon. No abnormal bowel wall thickening or evidence for bowel obstruction. No free fluid or free intraperitoneal air. Normal morphology of the stomach. Vascular/Lymphatic: Normal caliber abdominal aorta. Peripheral calcified atherosclerotic plaque. No retroperitoneal lymphadenopathy. Reproductive: Prostate is unremarkable. Other: None. Musculoskeletal: Sclerotic lesion within the medial right iliac wing adjacent to the SI joint is unchanged measuring approximately 7 cm (image 109; series 2). Unchanged small sclerotic lesion within the L5 vertebral body. Thoracic and lumbar spine degenerative changes. IMPRESSION: 1. Interval increase in size of superior mediastinal and subcarinal adenopathy. 2. Interval increase in size of 7 mm subpleural left lower lobe pulmonary nodule. 3. Interval development of subpleural ground-glass and consolidative opacities within the right lower lobe, potentially post radiation changes. Recommend attention on follow-up. 4. Grossly unchanged cavitary structure within the right upper lung which may represent evolving radiation changes. Recommend attention on follow-up. 5. Unchanged sclerotic  lesions within the right iliac wing and L5 vertebral body. 6. Persistent moderate pericardial effusion. 7.  Aortic Atherosclerosis (ICD10-I70.0). Electronically Signed   By: Lovey Newcomer M.D.   On: 08/06/2017 15:30   Ct Abdomen Pelvis W Contrast  Result Date: 08/06/2017 CLINICAL DATA:  Patient with history of lung cancer and osseous metastatic disease. Follow-up evaluation. EXAM: CT CHEST, ABDOMEN, AND PELVIS WITH CONTRAST TECHNIQUE: Multidetector CT imaging of the chest, abdomen and pelvis was performed following the standard protocol during bolus administration of intravenous contrast. CONTRAST:  100 cc Isovue-300 COMPARISON:  CT CAP 05/04/2017. FINDINGS: CT CHEST FINDINGS Cardiovascular: Normal heart size. Stable moderate pericardial effusion. Mediastinum/Nodes: Interval increase in size of right peritracheal lymph node measuring 1.5 cm (image 20; series 2), previously 0.9 cm. Slight interval increase in size of subcarinal lymph node measuring 10 mm (image 31; series 2), previously 8 mm. Additionally there is a new 9 mm subcarinal lymph node with central low density (image 28; series 2). Lungs/Pleura: Central airways are patent. Grossly unchanged cavitary structure within the right upper lobe with surrounding confluent soft tissue, bronchiectasis and volume loss. Slight interval increase in subpleural reticular consolidative opacities within the medial right lower lobe (image 63; series 4). Interval increase in size of 7 mm subpleural left lower lobe nodule (image 93; series 4, previously 3 mm. Musculoskeletal: No aggressive or acute appearing osseous lesions. CT ABDOMEN PELVIS FINDINGS Hepatobiliary: Liver is normal in size and contour. Gallbladder is unremarkable. No intrahepatic or extrahepatic biliary ductal dilatation. Pancreas: Unremarkable Spleen: Unremarkable Adrenals/Urinary Tract: Normal adrenal glands. Kidneys enhance  symmetrically with contrast. No hydronephrosis. Stable subcentimeter bilateral  low-attenuation renal lesions, too small to characterize. Urinary bladder is unremarkable. Stomach/Bowel: Stool throughout the colon. No abnormal bowel wall thickening or evidence for bowel obstruction. No free fluid or free intraperitoneal air. Normal morphology of the stomach. Vascular/Lymphatic: Normal caliber abdominal aorta. Peripheral calcified atherosclerotic plaque. No retroperitoneal lymphadenopathy. Reproductive: Prostate is unremarkable. Other: None. Musculoskeletal: Sclerotic lesion within the medial right iliac wing adjacent to the SI joint is unchanged measuring approximately 7 cm (image 109; series 2). Unchanged small sclerotic lesion within the L5 vertebral body. Thoracic and lumbar spine degenerative changes. IMPRESSION: 1. Interval increase in size of superior mediastinal and subcarinal adenopathy. 2. Interval increase in size of 7 mm subpleural left lower lobe pulmonary nodule. 3. Interval development of subpleural ground-glass and consolidative opacities within the right lower lobe, potentially post radiation changes. Recommend attention on follow-up. 4. Grossly unchanged cavitary structure within the right upper lung which may represent evolving radiation changes. Recommend attention on follow-up. 5. Unchanged sclerotic lesions within the right iliac wing and L5 vertebral body. 6. Persistent moderate pericardial effusion. 7.  Aortic Atherosclerosis (ICD10-I70.0). Electronically Signed   By: Lovey Newcomer M.D.   On: 08/06/2017 15:30     ASSESSMENT/PLAN:  Malignant neoplasm of bronchus of right upper lobe [facility]) This is a very pleasant 61 year old white male with a stage IV non-small cell lung cancer, adenocarcinoma status post 6 cycles of induction systemic chemotherapy with carbo platinum, Alimta and Avastin with partial response. This was followed by maintenance treatment with Alimta and Avastin for 3 cycles and his treatment was discontinued secondary to development of cavitary pneumonia in  the right upper lobe. The patient was then on observation. He had repeat CT scan of the chest, abdomen and pelvis that showed mild increase in the size of the superior mediastinal and subcarinal lymphadenopathy as well as a subpleural left lower lobe pulmonary nodule.  He is now here to begin second line immunotherapy with Nivolumab 480 mg IV every 4 weeks. I discussed with the patient adverse effect of this treatment including but not limited to immunotherapy mediated skin rash, diarrhea, inflammation of the lung, kidney, liver, thyroid or other endocrine dysfunction.  He will proceed with cycle 1 as scheduled today.    For his back pain, he will continue on radiation.  I have encouraged him to begin the higher doses of pain medication that were prescribed to him yesterday.  He will also resume his treatment with Xgeva every 4 weeks.    The patient will come back for follow-up visit in 4 weeks for evaluation prior to cycle 2 of his immunotherapy.  He was advised to call immediately if he has any concerning symptoms in the interval. The patient voices understanding of current disease status and treatment options and is in agreement with the current care plan. All questions were answered. The patient knows to call the clinic with any problems, questions or concerns. We can certainly see the patient much sooner if necessary.  No orders of the defined types were placed in this encounter.   Mikey Bussing, DNP, AGPCNP-BC, AOCNP 08/25/17

## 2017-08-26 ENCOUNTER — Ambulatory Visit
Admission: RE | Admit: 2017-08-26 | Discharge: 2017-08-26 | Disposition: A | Discharge status: Home / Self Care | Payer: Medicaid Other | Source: Ambulatory Visit | Attending: Radiation Oncology | Admitting: Radiation Oncology

## 2017-08-26 DIAGNOSIS — Z510 Encounter for antineoplastic radiation therapy: Secondary | ICD-10-CM | POA: Diagnosis not present

## 2017-08-27 ENCOUNTER — Ambulatory Visit
Admission: RE | Admit: 2017-08-27 | Discharge: 2017-08-27 | Disposition: A | Discharge status: Home / Self Care | Payer: Medicaid Other | Source: Ambulatory Visit | Attending: Radiation Oncology | Admitting: Radiation Oncology

## 2017-08-27 DIAGNOSIS — Z510 Encounter for antineoplastic radiation therapy: Secondary | ICD-10-CM | POA: Diagnosis not present

## 2017-08-28 ENCOUNTER — Ambulatory Visit
Admission: RE | Admit: 2017-08-28 | Discharge: 2017-08-28 | Disposition: A | Discharge status: Home / Self Care | Payer: Medicaid Other | Source: Ambulatory Visit | Attending: Radiation Oncology | Admitting: Radiation Oncology

## 2017-08-28 DIAGNOSIS — Z510 Encounter for antineoplastic radiation therapy: Secondary | ICD-10-CM | POA: Diagnosis not present

## 2017-08-31 ENCOUNTER — Ambulatory Visit
Admission: RE | Admit: 2017-08-31 | Discharge: 2017-08-31 | Disposition: A | Discharge status: Home / Self Care | Payer: Medicaid Other | Source: Ambulatory Visit | Attending: Radiation Oncology | Admitting: Radiation Oncology

## 2017-08-31 DIAGNOSIS — Z510 Encounter for antineoplastic radiation therapy: Secondary | ICD-10-CM | POA: Diagnosis not present

## 2017-09-01 ENCOUNTER — Telehealth: Payer: Self-pay | Admitting: Pulmonary Disease

## 2017-09-01 ENCOUNTER — Encounter: Payer: Self-pay | Admitting: Radiation Oncology

## 2017-09-01 ENCOUNTER — Ambulatory Visit
Admission: RE | Admit: 2017-09-01 | Discharge: 2017-09-01 | Disposition: A | Discharge status: Home / Self Care | Payer: Medicaid Other | Source: Ambulatory Visit | Attending: Radiation Oncology | Admitting: Radiation Oncology

## 2017-09-01 DIAGNOSIS — Z510 Encounter for antineoplastic radiation therapy: Secondary | ICD-10-CM | POA: Diagnosis not present

## 2017-09-01 MED ORDER — ALBUTEROL SULFATE HFA 108 (90 BASE) MCG/ACT IN AERS: 2.0000 | INHALATION_SPRAY | Freq: Four times a day (QID) | RESPIRATORY_TRACT | 2 refills | Status: DC | PRN

## 2017-09-01 NOTE — Telephone Encounter (Signed)
Spoke with pt and advised rx sent to pharmacy. Nothing further is needed.   

## 2017-09-04 NOTE — Progress Notes (Signed)
  Radiation Oncology         [phone] ________________________________  Name: Edward Spence MRN: [id number]  Date: 09/01/2017  DOB: 1955-10-26  End of Treatment Note  Diagnosis:   61 y.o. male with Stage IV (T3,N3,M1b) non-small cell lung cancer, adenocarcinoma in right upper lobe lung with metastatic disease in the right pelvis     Indication for treatment::  Palliative       Radiation treatment dates:   08/17/2017 - 09/01/2017  Site/dose:   The right pelvis was treated to 30 Gy in 10 fractions of 3 Gy.  Narrative: The patient tolerated radiation treatment relatively well.   He experienced mild fatigue. He reported progressive right hip pain despite taking pain medication. We temporarily increased his medication, and the patient reported this has helped. His urinary symptoms were significant for hesitancy when starting to void but otherwise denied any urinary issues. He denied any bowel issues, nausea, vomiting, or skin irritation.   Plan: The patient has completed radiation treatment. The patient will return to radiation oncology clinic for routine followup in one month. I advised the patient to call or return sooner if they have any questions or concerns related to their recovery or treatment. ________________________________  Jodelle Gross, MD, PhD  This document serves as a record of services personally performed by Kyung Rudd, MD. It was created on his behalf by Rae Lips, a trained medical scribe. The creation of this record is based on the scribe's personal observations and the provider's statements to them. This document has been checked and approved by the attending provider.

## 2017-09-14 ENCOUNTER — Telehealth: Payer: Self-pay | Admitting: *Deleted

## 2017-09-14 NOTE — Telephone Encounter (Signed)
Returned call left voice message,patient asking for refill on his oxycodone, will run out over the weekend, left vm that both Dr. Lisbeth Renshaw and Bryson Ha is out of the office ,MD will be back tomorrow and will call  Him tomorrow to come and pick up rx when it is ready 9:34 AM

## 2017-09-15 ENCOUNTER — Other Ambulatory Visit: Payer: Self-pay | Admitting: Radiation Oncology

## 2017-09-15 ENCOUNTER — Telehealth: Payer: Self-pay | Admitting: *Deleted

## 2017-09-15 DIAGNOSIS — C3411 Malignant neoplasm of upper lobe, right bronchus or lung: Secondary | ICD-10-CM

## 2017-09-15 DIAGNOSIS — Z5111 Encounter for antineoplastic chemotherapy: Secondary | ICD-10-CM

## 2017-09-15 DIAGNOSIS — J189 Pneumonia, unspecified organism: Secondary | ICD-10-CM

## 2017-09-15 DIAGNOSIS — J984 Other disorders of lung: Secondary | ICD-10-CM

## 2017-09-15 MED ORDER — OXYCODONE HCL 15 MG PO TABS: 15.0000 mg | ORAL_TABLET | ORAL | 0 refills | Status: DC | PRN

## 2017-09-15 NOTE — Telephone Encounter (Signed)
Called and spoke with patient  He can pick up rx at nursing  In [place] downstairs;bring photo ID, patient thaked [place] for the call will be in today 8:42 AM

## 2017-09-24 ENCOUNTER — Telehealth: Payer: Self-pay | Admitting: Internal Medicine

## 2017-09-24 ENCOUNTER — Other Ambulatory Visit (HOSPITAL_BASED_OUTPATIENT_CLINIC_OR_DEPARTMENT_OTHER): Payer: Medicaid Other

## 2017-09-24 ENCOUNTER — Ambulatory Visit (HOSPITAL_BASED_OUTPATIENT_CLINIC_OR_DEPARTMENT_OTHER): Payer: Medicaid Other | Admitting: Internal Medicine

## 2017-09-24 ENCOUNTER — Ambulatory Visit (HOSPITAL_BASED_OUTPATIENT_CLINIC_OR_DEPARTMENT_OTHER): Payer: Medicaid Other

## 2017-09-24 ENCOUNTER — Ambulatory Visit (HOSPITAL_BASED_OUTPATIENT_CLINIC_OR_DEPARTMENT_OTHER): Payer: Medicaid Other | Admitting: Medical

## 2017-09-24 ENCOUNTER — Encounter: Payer: Self-pay | Admitting: Internal Medicine

## 2017-09-24 VITALS — BP 102/60 | HR 93 | Temp 98.6°F | Resp 18

## 2017-09-24 VITALS — BP 104/69 | HR 106 | Temp 99.0°F | Resp 20 | Ht 75.0 in | Wt 158.9 lb

## 2017-09-24 DIAGNOSIS — D702 Other drug-induced agranulocytosis: Secondary | ICD-10-CM

## 2017-09-24 DIAGNOSIS — R5382 Chronic fatigue, unspecified: Secondary | ICD-10-CM | POA: Diagnosis not present

## 2017-09-24 DIAGNOSIS — Z5112 Encounter for antineoplastic immunotherapy: Secondary | ICD-10-CM

## 2017-09-24 DIAGNOSIS — C778 Secondary and unspecified malignant neoplasm of lymph nodes of multiple regions: Secondary | ICD-10-CM | POA: Diagnosis not present

## 2017-09-24 DIAGNOSIS — C3411 Malignant neoplasm of upper lobe, right bronchus or lung: Secondary | ICD-10-CM

## 2017-09-24 DIAGNOSIS — G893 Neoplasm related pain (acute) (chronic): Secondary | ICD-10-CM | POA: Diagnosis not present

## 2017-09-24 DIAGNOSIS — C7951 Secondary malignant neoplasm of bone: Secondary | ICD-10-CM

## 2017-09-24 DIAGNOSIS — M545 Low back pain: Secondary | ICD-10-CM

## 2017-09-24 DIAGNOSIS — T451X5A Adverse effect of antineoplastic and immunosuppressive drugs, initial encounter: Secondary | ICD-10-CM

## 2017-09-24 LAB — CBC WITH DIFFERENTIAL/PLATELET
BASO%: 0.2 % (ref 0.0–2.0)
BASOS ABS: 0 10*3/uL (ref 0.0–0.1)
EOS ABS: 0.1 10*3/uL (ref 0.0–0.5)
EOS%: 1 % (ref 0.0–7.0)
HCT: 37.1 % — ABNORMAL LOW (ref 38.4–49.9)
HEMOGLOBIN: 11.4 g/dL — AB (ref 13.0–17.1)
LYMPH%: 10.2 % — AB (ref 14.0–49.0)
MCH: 27.3 pg (ref 27.2–33.4)
MCHC: 30.7 g/dL — ABNORMAL LOW (ref 32.0–36.0)
MCV: 89 fL (ref 79.3–98.0)
MONO#: 0.3 10*3/uL (ref 0.1–0.9)
MONO%: 6.1 % (ref 0.0–14.0)
NEUT#: 4 10*3/uL (ref 1.5–6.5)
NEUT%: 82.5 % — ABNORMAL HIGH (ref 39.0–75.0)
Platelets: 211 10*3/uL (ref 140–400)
RBC: 4.17 10*6/uL — AB (ref 4.20–5.82)
RDW: 16.6 % — ABNORMAL HIGH (ref 11.0–14.6)
WBC: 4.9 10*3/uL (ref 4.0–10.3)
lymph#: 0.5 10*3/uL — ABNORMAL LOW (ref 0.9–3.3)

## 2017-09-24 LAB — COMPREHENSIVE METABOLIC PANEL
ALBUMIN: 3.1 g/dL — AB (ref 3.5–5.0)
ALK PHOS: 63 U/L (ref 40–150)
ALT: 6 U/L (ref 0–55)
AST: 11 U/L (ref 5–34)
Anion Gap: 10 mEq/L (ref 3–11)
BUN: 7.6 mg/dL (ref 7.0–26.0)
CALCIUM: 9.2 mg/dL (ref 8.4–10.4)
CHLORIDE: 105 meq/L (ref 98–109)
CO2: 24 mEq/L (ref 22–29)
Creatinine: 0.7 mg/dL (ref 0.7–1.3)
GLUCOSE: 85 mg/dL (ref 70–140)
Potassium: 3.7 mEq/L (ref 3.5–5.1)
SODIUM: 139 meq/L (ref 136–145)
Total Bilirubin: 0.36 mg/dL (ref 0.20–1.20)
Total Protein: 7.6 g/dL (ref 6.4–8.3)

## 2017-09-24 LAB — TSH: TSH: 1.06 m[IU]/L (ref 0.320–4.118)

## 2017-09-24 MED ORDER — DENOSUMAB 120 MG/1.7ML ~~LOC~~ SOLN
120.0000 mg | Freq: Once | SUBCUTANEOUS | Status: AC
  Administered 2017-09-24: 120 mg via SUBCUTANEOUS

## 2017-09-24 MED ORDER — DIPHENHYDRAMINE HCL 50 MG/ML IJ SOLN
50.0000 mg | Freq: Once | INTRAMUSCULAR | Status: AC | PRN
  Administered 2017-09-24: 50 mg via INTRAVENOUS

## 2017-09-24 MED ORDER — DENOSUMAB 120 MG/1.7ML ~~LOC~~ SOLN
SUBCUTANEOUS | Status: AC
  Filled 2017-09-24: qty 1.7

## 2017-09-24 MED ORDER — FAMOTIDINE IN NACL 20-0.9 MG/50ML-% IV SOLN
20.0000 mg | Freq: Once | INTRAVENOUS | Status: AC | PRN
  Administered 2017-09-24: 20 mg via INTRAVENOUS

## 2017-09-24 MED ORDER — SODIUM CHLORIDE 0.9 % IV SOLN
Freq: Once | INTRAVENOUS | Status: AC
  Administered 2017-09-24: 11:00:00 via INTRAVENOUS

## 2017-09-24 MED ORDER — SODIUM CHLORIDE 0.9 % IV SOLN
20.0000 mg | Freq: Once | INTRAVENOUS | Status: AC
  Administered 2017-09-24: 20 mg via INTRAVENOUS
  Filled 2017-09-24: qty 2

## 2017-09-24 MED ORDER — SODIUM CHLORIDE 0.9 % IV SOLN
480.0000 mg | Freq: Once | INTRAVENOUS | Status: AC
  Administered 2017-09-24: 480 mg via INTRAVENOUS
  Filled 2017-09-24: qty 48

## 2017-09-24 NOTE — Telephone Encounter (Signed)
Gave avs and calendar for January - march 2019

## 2017-09-24 NOTE — Progress Notes (Signed)
Called PA Lucianne Lei about pt's vitals and asked about rechallenging.  PA Lucianne Lei recommended to start at half original rate of 296 ml/hr.  Restarted opdivo at 148 ml/hr at 1455 per verbal order.  VS and pt will be monitored closely.

## 2017-09-24 NOTE — Patient Instructions (Signed)
[facility] Discharge Instructions for Patients Receiving Chemotherapy  Today you received the following chemotherapy agents :  Nivolumab,  Xgeva.  To help prevent nausea and vomiting after your treatment, we encourage you to take your nausea medication as prescribed.   If you develop nausea and vomiting that is not controlled by your nausea medication, call the clinic.   BELOW ARE SYMPTOMS THAT SHOULD BE REPORTED IMMEDIATELY:  *FEVER GREATER THAN 100.5 F  *CHILLS WITH OR WITHOUT FEVER  NAUSEA AND VOMITING THAT IS NOT CONTROLLED WITH YOUR NAUSEA MEDICATION  *UNUSUAL SHORTNESS OF BREATH  *UNUSUAL BRUISING OR BLEEDING  TENDERNESS IN MOUTH AND THROAT WITH OR WITHOUT PRESENCE OF ULCERS  *URINARY PROBLEMS  *BOWEL PROBLEMS  UNUSUAL RASH Items with * indicate a potential emergency and should be followed up as soon as possible.  Feel free to call the clinic should you have any questions or concerns. The clinic phone number is [phone].  Please show the [facility] at check-in to the Emergency Department and triage nurse.

## 2017-09-24 NOTE — Progress Notes (Signed)
Nivolumab hung at 1214.  At 1230, pt notified nurse of not able to breathe.  Noted facial redness, unable to talk, but still alert and oriented.  Opdivo stopped at [place] pm.  Lucianne Lei, PA notified.  O2 at 2L [place] applied; vitals taken and documented.  Normal saline running at 200 cc/hr.  Hypersensitivity protocol initiated for mild reaction as per PA.  Pt was being monitored very closely.

## 2017-09-24 NOTE — Progress Notes (Signed)
[place] [phone]   [phone]  OFFICE PROGRESS NOTE  [name], FNP [address]  [place] [address]  DIAGNOSIS: Stage IV (T3, N3, M1b) non-small cell lung cancer, adenocarcinoma diagnosed in August 2017 and presented with right upper lobe lung nodule as well as large right superior mediastinal and right paratracheal lymphadenopathy and bone metastases in the right iliac and L5 vertebral body.  GUARDANT 360: Negative for EGFR, ALK, ROS 1, BRAF mutations but showed amplification of EGFR and MET  PRIOR THERAPY: 1) Palliative systemic chemotherapy to the right superior mediastinal and right paratracheal lymphadenopathy under the care of Dr. Lisbeth Renshaw. 2) Systemic chemotherapy with carboplatin for AUC of 5, Alimta 500 MG/M2 and Avastin 15 MG/KG every 3 weeks is status post 6 cycles. First cycle was given on 07/28/2016.  3) Maintenance systemic chemotherapy with Alimta 500 MG/M2 and Avastin 15 MG/KG every 3 weeks. First dose 11/19/2016. Status post 3 cycles. Avastin was discontinued after cycle #3 secondary to development of right upper lobe cavitary lesion. Treatment is currently on hold secondary to the cavitary lesion in the right upper lobe.   CURRENT THERAPY: Second line treatment with immunotherapy was Nivolumab 480 mg IV every 4 weeks.  First cycle August 25, 2017.  Status post 1 cycle.  INTERVAL HISTORY: Edward Spence 61 y.o. male returns to the clinic today for follow-up visit.  The patient is feeling fine today with no specific complaints except for the low back pain.  He is currently on pain medication with MS Contin and oxycodone.  He tolerated the first cycle of his treatment with Nivolumab fairly well.  He denied having any current chest pain, shortness breath, cough or hemoptysis.  He denied having any fever or chills.  He lost around 5 pounds since his last visit because of lack of appetite with his pain.  The patient is here today  for evaluation before starting cycle #2.  MEDICAL HISTORY: Past Medical History:  Diagnosis Date  . Cavitary pneumonia 01/21/2017  . Constipation 07/23/2016  . Encounter for antineoplastic chemotherapy 06/24/2016  . Lung cancer ([place])   . Lung mass 05/19/2016  . Pneumonia    in college    ALLERGIES:  is allergic to penicillins.  MEDICATIONS:  Current Outpatient Medications  Medication Sig Dispense Refill  . albuterol (PROVENTIL HFA;VENTOLIN HFA) 108 (90 Base) MCG/ACT inhaler Inhale 2 puffs into the lungs every 6 (six) hours as needed for wheezing or shortness of breath. 1 Inhaler 2  . morphine (MS CONTIN) 60 MG 12 hr tablet Take 1 tablet (60 mg total) by mouth 3 (three) times daily. 90 tablet 0  . oxyCODONE (ROXICODONE) 15 MG immediate release tablet Take 1 tablet (15 mg total) by mouth every 4 (four) hours as needed. 120 tablet 0  . Respiratory Therapy Supplies (FLUTTER) DEVI USE AS DIRECTED 1 each 0   No current facility-administered medications for this visit.     SURGICAL HISTORY:  Past Surgical History:  Procedure Laterality Date  . APPENDECTOMY    . VIDEO BRONCHOSCOPY Bilateral 02/27/2017   Procedure: VIDEO BRONCHOSCOPY WITH FLUORO;  Surgeon: Marshell Garfinkel, MD;  Location: [facility];  Service: Cardiopulmonary;  Laterality: Bilateral;  . VIDEO BRONCHOSCOPY WITH ENDOBRONCHIAL ULTRASOUND N/A 05/22/2016   Procedure: VIDEO BRONCHOSCOPY WITH ENDOBRONCHIAL ULTRASOUND;  Surgeon: [name], MD;  Location: [facility];  Service: Thoracic;  Laterality: N/A;    REVIEW OF SYSTEMS:  A comprehensive review of systems was negative  except for: Musculoskeletal: positive for back pain   PHYSICAL EXAMINATION: General appearance: alert, cooperative and no distress Head: Normocephalic, without obvious abnormality, atraumatic Neck: no adenopathy, no JVD, supple, symmetrical, trachea midline and thyroid not enlarged, symmetric, no tenderness/mass/nodules Lymph nodes: Cervical,  supraclavicular, and axillary nodes normal. Resp: clear to auscultation bilaterally Back: symmetric, no curvature. ROM normal. No CVA tenderness. Cardio: regular rate and rhythm, S1, S2 normal, no murmur, click, rub or gallop GI: soft, non-tender; bowel sounds normal; no masses,  no organomegaly Extremities: extremities normal, atraumatic, no cyanosis or edema  ECOG PERFORMANCE STATUS: 1 - Symptomatic but completely ambulatory  Blood pressure 104/69, pulse (!) 106, temperature 99 F (37.2 C), temperature source Oral, resp. rate 20, height 6' 3"  (1.905 m), weight 158 lb 14.4 oz (72.1 kg), SpO2 98 %.  LABORATORY DATA: Lab Results  Component Value Date   WBC 4.9 09/24/2017   HGB 11.4 (L) 09/24/2017   HCT 37.1 (L) 09/24/2017   MCV 89.0 09/24/2017   PLT 211 09/24/2017      Chemistry      Component Value Date/Time   NA 139 09/24/2017 0930   K 3.7 09/24/2017 0930   CL 107 05/22/2016 0641   CO2 24 09/24/2017 0930   BUN 7.6 09/24/2017 0930   CREATININE 0.7 09/24/2017 0930      Component Value Date/Time   CALCIUM 9.2 09/24/2017 0930   ALKPHOS 63 09/24/2017 0930   AST 11 09/24/2017 0930   ALT 6 09/24/2017 0930   BILITOT 0.36 09/24/2017 0930       RADIOGRAPHIC STUDIES: No results found.  ASSESSMENT AND PLAN:  This is a very pleasant 61 years old white male with a stage IV non-small cell lung cancer, adenocarcinoma status post 6 cycles of induction systemic chemotherapy with carbo platinum, Alimta and Avastin with partial response. This was followed by maintenance treatment with Alimta and Avastin for 3 cycles and his treatment was discontinued secondary to development of cavitary pneumonia in the right upper lobe. He was on observation for several months followed by disease progression on imaging studies. The patient is currently undergoing second line treatment with Nivolumab 480 mg IV every 4 weeks status post 1 cycle.  He tolerated the first cycle of his treatment fairly  well. I recommended for the patient to proceed with cycle #2 today as a scheduled. For pain management he will continue on MS Contin and oxycodone. 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 He was advised to call immediately if he has any concerning symptoms in the interval.  The patient voices understanding of current disease status and treatment options and is in agreement with the current care plan. All questions were answered. The patient knows to call the clinic with any problems, questions or concerns. We can certainly see the patient much sooner if necessary.  Disclaimer: This note was dictated with voice recognition software. Similar sounding words can inadvertently be transcribed and may not be corrected upon review.

## 2017-09-25 NOTE — Progress Notes (Signed)
Sympt[facility] Management Clinic Progress Note   Edward Spence [id number] 1955/10/02 61 y.o.  Edward Spence is managed by Dr. Eilleen Kempf  Actively treated with chemotherapy: yes  Current Therapy: Nivolumab  Last Treated: 08/25/2017  Assessment: Plan:    Adverse effect of chemotherapy, initial encounter - Plan: dexamethasone (DECADRON) 20 mg in sodium chloride 0.9 % 50 mL IVPB  Edward Spence was seen in the infusion room for a suspected chemotherapy reaction. He was receiving nivolumab at the time of his reaction. He had just started his infusion prior to onset of symptoms. His symptoms included: Shortness of breathand breath and chest tightness He was not premedicated prior to starting therapy. Nivolumab was paused and Edward Spence was given Decadron 20 mg IV after onset of his symptoms. Edward Spence did  respond to intervention and was able to complete his therapy.   This case was discussed with Dr. Julien Nordmann.  Please see After Visit Summary for patient specific instructions.  Future Appointments  Date Time Provider [place]  10/19/2017  1:30 PM Hayden Pedro, PA-C [facility] None  10/20/2017  1:30 PM [facility] LAB 2 [facility] None  10/20/2017  2:00 PM Curt Bears, MD [facility] None  10/20/2017  3:00 PM [facility] F21 [facility] None  11/17/2017 10:00 AM [facility] LAB 2 [facility] None  11/17/2017 10:30 AM Curt Bears, MD [facility] None  11/17/2017 11:30 AM [facility] C10 [facility] None  12/15/2017  9:00 AM [facility] LAB 2 [facility] None  12/15/2017  9:30 AM Curt Bears, MD [facility] None  12/15/2017 10:30 AM [facility] A2 [facility] None    No orders of the defined types were placed in this encounter.      Subjective:   Patient ID:  Edward Spence is a 61 y.o. (DOB 10-09-1955) male.  Chief Complaint: No chief complaint on file.   HPI Edward Spence was seen in the infusion room  for a suspected reaction to chemotherapy. He was receiving nivolumab at the time of his reaction. He had just started his infusion prior to onset of symptoms. His symptoms included: Shortness of breathand breath and chest tightness. He was not premedicated prior to starting therapy. Nivolumab was paused and Edward Spence was given Decadron 20 mg IV after onset of his symptoms. Edward Spence did  respond to intervention and was able to complete his therapy.  This case was discussed with Dr. Julien Nordmann.  Medications: I have reviewed the patient's current medications.  Allergies:  Allergies  Allergen Reactions  . Penicillins Hives    Had penicillin it at age 80 and " I got trench mouth and hives " Has patient had a PCN reaction causing immediate rash, facial/tongue/throat swelling, SOB or lightheadedness with hypotension: No Has patient had a PCN reaction causing severe rash involving mucus membranes or skin necrosis: Yes Has patient had a PCN reaction that required hospitalization: No Has patient had a PCN reaction occurring within the last 10 years: No     Past Medical History:  Diagnosis Date  . Cavitary pneumonia 01/21/2017  . Constipation 07/23/2016  . Encounter for antineoplastic chemotherapy 06/24/2016  . Lung cancer ([place])   . Lung mass 05/19/2016  . Pneumonia    in college    Past Surgical History:  Procedure Laterality Date  . APPENDECTOMY    . VIDEO BRONCHOSCOPY Bilateral 02/27/2017   Procedure: VIDEO BRONCHOSCOPY WITH FLUORO;  Surgeon: Marshell Garfinkel, MD;  [facility];  Service: Cardiopulmonary;  Laterality: Bilateral;  .  VIDEO BRONCHOSCOPY WITH ENDOBRONCHIAL ULTRASOUND N/A 05/22/2016   Procedure: VIDEO BRONCHOSCOPY WITH ENDOBRONCHIAL ULTRASOUND;  Surgeon: [name], MD;  [facility]: [facility] OR;  Service: Thoracic;  Laterality: N/A;    Family History  Problem Relation Age of Onset  . Cancer Mother   . COPD Father   . Heart failure Father     Social  History   Socioeconomic History  . Marital status: Divorced    Spouse name: Not on file  . Number of children: Not on file  . Years of education: Not on file  . Highest education level: Not on file  Social Needs  . Financial resource strain: Not on file  . Food insecurity - worry: Not on file  . Food insecurity - inability: Not on file  . Transportation needs - medical: Not on file  . Transportation needs - non-medical: Not on file  Occupational History  . Not on file  Tobacco Use  . Smoking status: Current Every Day Smoker    Packs/day: 0.50    Years: 40.00    Pack years: 20.00    Types: Cigarettes    Last attempt to quit: 04/29/2016    Years since quitting: 1.4  . Smokeless tobacco: Never Used  . Tobacco comment: 1-5 per day  Substance and Sexual Activity  . Alcohol use: No  . Drug use: No  . Sexual activity: Not on file  Other Topics Concern  . Not on file  Social History Narrative  . Not on file    Past Medical History, Surgical history, Social history, and Family history were reviewed and updated as appropriate.   Please see review of systems for further details on the patient's review from today.   Review of Systems:  Review of Systems  Constitutional: Negative for chills, diaphoresis and fever.  HENT: Negative for trouble swallowing.   Respiratory: Positive for chest tightness and shortness of breath. Negative for cough, choking and wheezing.   Cardiovascular: Negative for chest pain.    Objective:   Physical Exam:  There were no vitals taken for this visit.    Physical Exam  Constitutional: No distress.  HENT:  Head: Normocephalic and atraumatic.  Mouth/Throat: Oropharynx is clear and moist. No oropharyngeal exudate.  The patient showed mild flushing of the face.  Neck: Neck supple.  Cardiovascular: Normal rate, regular rhythm and normal heart sounds. Exam reveals no gallop and no friction rub.  No murmur heard. Pulmonary/Chest: Effort normal and  breath sounds normal. No respiratory distress. He has no wheezes. He has no rales.  Lymphadenopathy:    He has no cervical adenopathy.  Neurological: He is alert.  Skin: Skin is warm and dry. No rash noted. He is not diaphoretic.    Lab Review:     Component Value Date/Time   NA 139 09/24/2017 0930   K 3.7 09/24/2017 0930   CL 107 05/22/2016 0641   CO2 24 09/24/2017 0930   GLUCOSE 85 09/24/2017 0930   BUN 7.6 09/24/2017 0930   CREATININE 0.7 09/24/2017 0930   CALCIUM 9.2 09/24/2017 0930   PROT 7.6 09/24/2017 0930   ALBUMIN 3.1 (L) 09/24/2017 0930   AST 11 09/24/2017 0930   ALT 6 09/24/2017 0930   ALKPHOS 63 09/24/2017 0930   BILITOT 0.36 09/24/2017 0930   GFRNONAA >60 05/22/2016 0641   GFRAA >60 05/22/2016 0641       Component Value Date/Time   WBC 4.9 09/24/2017 0930   WBC 5.2 03/11/2017 1407  RBC 4.17 (L) 09/24/2017 0930   RBC 3.68 (L) 03/11/2017 1407   HGB 11.4 (L) 09/24/2017 0930   HCT 37.1 (L) 09/24/2017 0930   PLT 211 09/24/2017 0930   MCV 89.0 09/24/2017 0930   MCH 27.3 09/24/2017 0930   MCH 29.5 02/19/2017 0840   MCHC 30.7 (L) 09/24/2017 0930   MCHC 31.1 03/11/2017 1407   RDW 16.6 (H) 09/24/2017 0930   LYMPHSABS 0.5 (L) 09/24/2017 0930   MONOABS 0.3 09/24/2017 0930   EOSABS 0.1 09/24/2017 0930   BASOSABS 0.0 09/24/2017 0930   -------------------------------  Imaging from last 24 hours (if applicable):  Radiology interpretation: No results found.      This case was discussed with Dr. Julien Nordmann. He expressed agreement with my management of this patient.

## 2017-10-02 ENCOUNTER — Other Ambulatory Visit: Payer: Self-pay | Admitting: Radiation Oncology

## 2017-10-02 ENCOUNTER — Telehealth: Payer: Self-pay | Admitting: Medical Oncology

## 2017-10-02 ENCOUNTER — Telehealth: Payer: Self-pay | Admitting: *Deleted

## 2017-10-02 ENCOUNTER — Other Ambulatory Visit: Payer: Self-pay | Admitting: Medical Oncology

## 2017-10-02 DIAGNOSIS — J984 Other disorders of lung: Secondary | ICD-10-CM

## 2017-10-02 DIAGNOSIS — J189 Pneumonia, unspecified organism: Secondary | ICD-10-CM

## 2017-10-02 DIAGNOSIS — Z5111 Encounter for antineoplastic chemotherapy: Secondary | ICD-10-CM

## 2017-10-02 DIAGNOSIS — C3411 Malignant neoplasm of upper lobe, right bronchus or lung: Secondary | ICD-10-CM

## 2017-10-02 MED ORDER — OXYCODONE HCL 15 MG PO TABS: 15.0000 mg | ORAL_TABLET | ORAL | 0 refills | Status: DC | PRN

## 2017-10-02 MED ORDER — MORPHINE SULFATE ER 60 MG PO TBCR: 60.0000 mg | EXTENDED_RELEASE_TABLET | Freq: Three times a day (TID) | ORAL | 0 refills | Status: DC

## 2017-10-02 NOTE — Telephone Encounter (Signed)
Diane, yes, our MDs and PA's have a finger print  That they send rx's electronically e-scribed, [place]

## 2017-10-02 NOTE — Telephone Encounter (Signed)
Pt requests refill oxycodone/Morphine . Call pt when ready for pick up.

## 2017-10-02 NOTE — Telephone Encounter (Signed)
Called patient asked where his pain medication  rx's  can be sent , if at [facility] in [place], Dr. Lisbeth Renshaw can electronically send in and no need to drive up here to collect ?, patient would appreciate sending rx;s to [facility] , and thanked Dr. Benjie Karvonen and this RN for returning his call so soon 11:04 AM

## 2017-10-02 NOTE — Telephone Encounter (Signed)
Called [facility] in [place], they did get rx e-scribed fingerprint for patient's mscontin and oxycodone IR, they will call when ready , 1:09 PM

## 2017-10-19 ENCOUNTER — Encounter: Payer: Self-pay | Admitting: Radiation Oncology

## 2017-10-19 ENCOUNTER — Ambulatory Visit
Admission: RE | Admit: 2017-10-19 | Discharge: 2017-10-19 | Disposition: A | Discharge status: Home / Self Care | Payer: Medicaid Other | Source: Ambulatory Visit | Attending: Radiation Oncology | Admitting: Radiation Oncology

## 2017-10-19 ENCOUNTER — Other Ambulatory Visit: Payer: Self-pay

## 2017-10-19 VITALS — BP 104/54 | HR 107 | Temp 98.4°F | Resp 18 | Wt 157.4 lb

## 2017-10-19 DIAGNOSIS — C7951 Secondary malignant neoplasm of bone: Secondary | ICD-10-CM

## 2017-10-19 DIAGNOSIS — C3411 Malignant neoplasm of upper lobe, right bronchus or lung: Secondary | ICD-10-CM

## 2017-10-19 DIAGNOSIS — Z510 Encounter for antineoplastic radiation therapy: Secondary | ICD-10-CM | POA: Diagnosis not present

## 2017-10-19 NOTE — Progress Notes (Signed)
Edward Spence is here for a follow up appointment. States that he has pain in his hip and lower back.Denies any shortness of breath. Denies any difficulty with swallowing. States that he coughs up yellowish plegam. States that his appetite is good. States that he has moderate fatigue. Vitals:   10/19/17 1320 10/19/17 1324  BP: 94/66 (!) 104/54  Pulse: 99 (!) 107  Resp: 18 18  Temp: 98.4 F (36.9 C) 98.4 F (36.9 C)  TempSrc: Oral Oral  SpO2: 99% 99%  Weight: 157 lb 6 oz (71.4 kg)    Wt Readings from Last 3 Encounters:  10/19/17 157 lb 6 oz (71.4 kg)  09/24/17 158 lb 14.4 oz (72.1 kg)  08/25/17 164 lb 8 oz (74.6 kg)

## 2017-10-19 NOTE — Progress Notes (Signed)
Radiation Oncology         [phone] ________________________________  Name: RC AMISON MRN: [id number]  Date of Service: 10/19/2017 DOB: January 03, 1956  Post Treatment Note  CC: House, [name], FNP  Curt Bears, MD  Diagnosis: Stage IV (T3,N3,M1b) non-small cell lung cancer, adenocarcinoma in right upper lobe lung with metastatic disease in the right pelvis  Interval Since Last Radiation:  7 weeks   08/17/2017 - 09/01/2017: The right pelvis was treated to 30 Gy in 10 fractions of 3 Gy.  06/09/2016 through 06/30/2016: The patient was treated to the right lung in a palliative manner to a dose of 37.5 gray in 15 fractions. The patient also received 30 gray in 10 fractions to the right pelvis. The right lung was treated with a three-field technique in the right pelvis was treated using a 4 field technique.  Narrative:  The patient returns today for routine follow-up. In summary he received radiotherapy to the right lung and right pelvis in 2017. He was retreated to the right pelvis in November into December of 2018. His tumor in the right iliac wing adjacent to the SI joint is quite large at 7 cm.  The patient tolerated his recent course of radiotherapy well but did have significant pain in the right hip and low right side of his back. We had to make adjustments in his long and short acting pain medication with the hopes of decreasing his medications following radiation. Though he noted improvement in the intensity of his pain, he continues to have constant pain in his low back and right hip. He describes radiation of symptoms down his posterior thigh and buttock on occasion as well as pain in the anterior right thigh. He denies any loss of bowel or bladder control. He reports his long acting morphine is difficult to dicern if it's helping or not. He does note quick relief though when he takes oxycodone, but that it does not last long.                               ALLERGIES:  is  allergic to penicillins.  Meds: Current Outpatient Medications  Medication Sig Dispense Refill  . albuterol (PROVENTIL HFA;VENTOLIN HFA) 108 (90 Base) MCG/ACT inhaler Inhale 2 puffs into the lungs every 6 (six) hours as needed for wheezing or shortness of breath. 1 Inhaler 2  . morphine (MS CONTIN) 60 MG 12 hr tablet Take 1 tablet (60 mg total) by mouth 3 (three) times daily. 90 tablet 0  . oxyCODONE (ROXICODONE) 15 MG immediate release tablet Take 1 tablet (15 mg total) by mouth every 4 (four) hours as needed. 120 tablet 0  . Respiratory Therapy Supplies (FLUTTER) DEVI USE AS DIRECTED 1 each 0   No current facility-administered medications for this encounter.     Physical Findings:  weight is 157 lb 6 oz (71.4 kg). His oral temperature is 98.4 F (36.9 C). His blood pressure is 104/54 (abnormal) and his pulse is 107 (abnormal). His respiration is 18 and oxygen saturation is 99%.  Pain Assessment Pain Score: 3  Pain Loc: Hip(back)/10 In general this is a well appearing caucasian male in no acute distress. He's alert and oriented x4 and appropriate throughout the examination. Cardiopulmonary assessment is negative for acute distress and he exhibits normal effort. He takes a few seconds to stand upright due to pain. His gait is unremarkable.   Lab Findings: Lab Results  Component Value Date   WBC 4.9 09/24/2017   HGB 11.4 (L) 09/24/2017   HCT 37.1 (L) 09/24/2017   MCV 89.0 09/24/2017   PLT 211 09/24/2017     Radiographic Findings: No results found.  Impression/Plan: 1. Stage IV (T3,N3,M1b) non-small cell lung cancer, adenocarcinoma in right upper lobe lung with metastatic disease in the right pelvis. The patient has tolerated radiotherapy well and will continue with immunotherapy under the care of Dr. Julien Nordmann. I let him know we'd be happy to see him again as needed in the future.  2. Pain secondary to #1. He has persistent pain despite his treatment. While he has a large tumor, I  have discussed with him the options of meeting with Dr. Maryjean Ka in pain management to see if there are any procedural interventions to help with his pain. He also has degenerative lumbar spine changes on CT prior to treatment and perhaps this also is contributing to his symptoms.       Carola Rhine, PAC

## 2017-10-20 ENCOUNTER — Inpatient Hospital Stay (HOSPITAL_BASED_OUTPATIENT_CLINIC_OR_DEPARTMENT_OTHER): Payer: Medicaid Other | Admitting: Internal Medicine

## 2017-10-20 ENCOUNTER — Ambulatory Visit (HOSPITAL_BASED_OUTPATIENT_CLINIC_OR_DEPARTMENT_OTHER): Payer: Medicaid Other | Admitting: Medical

## 2017-10-20 ENCOUNTER — Inpatient Hospital Stay: Payer: Medicaid Other | Attending: Internal Medicine

## 2017-10-20 ENCOUNTER — Encounter: Payer: Self-pay | Admitting: Internal Medicine

## 2017-10-20 ENCOUNTER — Telehealth: Payer: Self-pay | Admitting: *Deleted

## 2017-10-20 ENCOUNTER — Inpatient Hospital Stay: Payer: Medicaid Other

## 2017-10-20 VITALS — BP 129/67 | HR 106 | Temp 99.0°F | Resp 17

## 2017-10-20 DIAGNOSIS — C7951 Secondary malignant neoplasm of bone: Secondary | ICD-10-CM | POA: Insufficient documentation

## 2017-10-20 DIAGNOSIS — C3411 Malignant neoplasm of upper lobe, right bronchus or lung: Secondary | ICD-10-CM | POA: Insufficient documentation

## 2017-10-20 DIAGNOSIS — G8929 Other chronic pain: Secondary | ICD-10-CM

## 2017-10-20 DIAGNOSIS — C3490 Malignant neoplasm of unspecified part of unspecified bronchus or lung: Secondary | ICD-10-CM

## 2017-10-20 DIAGNOSIS — Z5112 Encounter for antineoplastic immunotherapy: Secondary | ICD-10-CM | POA: Insufficient documentation

## 2017-10-20 DIAGNOSIS — D702 Other drug-induced agranulocytosis: Secondary | ICD-10-CM

## 2017-10-20 DIAGNOSIS — Z9221 Personal history of antineoplastic chemotherapy: Secondary | ICD-10-CM | POA: Diagnosis not present

## 2017-10-20 DIAGNOSIS — R0602 Shortness of breath: Secondary | ICD-10-CM

## 2017-10-20 DIAGNOSIS — R5382 Chronic fatigue, unspecified: Secondary | ICD-10-CM

## 2017-10-20 LAB — COMPREHENSIVE METABOLIC PANEL
ALBUMIN: 3.3 g/dL — AB (ref 3.5–5.0)
ALT: 9 U/L (ref 0–55)
ANION GAP: 8 (ref 3–11)
AST: 13 U/L (ref 5–34)
Alkaline Phosphatase: 68 U/L (ref 40–150)
BUN: 9 mg/dL (ref 7–26)
CHLORIDE: 103 mmol/L (ref 98–109)
CO2: 26 mmol/L (ref 22–29)
Calcium: 9.5 mg/dL (ref 8.4–10.4)
Creatinine, Ser: 0.67 mg/dL — ABNORMAL LOW (ref 0.70–1.30)
GFR calc Af Amer: >60 mL/min (ref >60)
Glucose, Bld: 93 mg/dL (ref 70–140)
POTASSIUM: 4.1 mmol/L (ref 3.5–5.1)
Sodium: 137 mmol/L (ref 136–145)
Total Bilirubin: 0.3 mg/dL (ref 0.2–1.2)
Total Protein: 7.3 g/dL (ref 6.4–8.3)

## 2017-10-20 LAB — CBC WITH DIFFERENTIAL/PLATELET
Basophils Absolute: 0 10*3/uL (ref 0.0–0.1)
Basophils Relative: 1 %
Eosinophils Absolute: 0.2 10*3/uL (ref 0.0–0.5)
Eosinophils Relative: 4 %
HEMATOCRIT: 39 % (ref 38.4–49.9)
HEMOGLOBIN: 11.8 g/dL — AB (ref 13.0–17.1)
LYMPHS ABS: 0.7 10*3/uL — AB (ref 0.9–3.3)
LYMPHS PCT: 16 %
MCH: 26.4 pg — AB (ref 27.2–33.4)
MCHC: 30.3 g/dL — AB (ref 32.0–36.0)
MCV: 87.2 fL (ref 79.3–98.0)
MONOS PCT: 9 %
Monocytes Absolute: 0.4 10*3/uL (ref 0.1–0.9)
NEUTROS PCT: 70 %
Neutro Abs: 3.1 10*3/uL (ref 1.5–6.5)
Platelets: 220 10*3/uL (ref 140–400)
RBC: 4.47 MIL/uL (ref 4.20–5.82)
RDW: 17.2 % — ABNORMAL HIGH (ref 11.0–15.6)
WBC: 4.3 10*3/uL (ref 4.0–10.3)

## 2017-10-20 LAB — TSH: TSH: 1.3 u[IU]/mL (ref 0.320–4.118)

## 2017-10-20 LAB — SAVE SMEAR

## 2017-10-20 MED ORDER — FAMOTIDINE IN NACL 20-0.9 MG/50ML-% IV SOLN
20.0000 mg | Freq: Once | INTRAVENOUS | Status: AC | PRN
  Administered 2017-10-20: 20 mg via INTRAVENOUS

## 2017-10-20 MED ORDER — METHYLPREDNISOLONE SODIUM SUCC 125 MG IJ SOLR
125.0000 mg | Freq: Once | INTRAMUSCULAR | Status: AC | PRN
  Administered 2017-10-20: 125 mg via INTRAVENOUS

## 2017-10-20 MED ORDER — SODIUM CHLORIDE 0.9 % IV SOLN
480.0000 mg | Freq: Once | INTRAVENOUS | Status: AC
  Administered 2017-10-20: 480 mg via INTRAVENOUS
  Filled 2017-10-20: qty 48

## 2017-10-20 MED ORDER — ALBUTEROL SULFATE (2.5 MG/3ML) 0.083% IN NEBU
2.5000 mg | INHALATION_SOLUTION | Freq: Once | RESPIRATORY_TRACT | Status: AC | PRN
  Administered 2017-10-20: 2.5 mg via RESPIRATORY_TRACT
  Filled 2017-10-20: qty 3

## 2017-10-20 MED ORDER — DENOSUMAB 120 MG/1.7ML ~~LOC~~ SOLN
120.0000 mg | Freq: Once | SUBCUTANEOUS | Status: AC
  Administered 2017-10-20: 120 mg via SUBCUTANEOUS

## 2017-10-20 MED ORDER — DIPHENHYDRAMINE HCL 50 MG/ML IJ SOLN
50.0000 mg | Freq: Once | INTRAMUSCULAR | Status: AC | PRN
  Administered 2017-10-20: 50 mg via INTRAVENOUS

## 2017-10-20 MED ORDER — SODIUM CHLORIDE 0.9 % IV SOLN
Freq: Once | INTRAVENOUS | Status: AC
  Administered 2017-10-20: 15:00:00 via INTRAVENOUS

## 2017-10-20 MED ORDER — DENOSUMAB 120 MG/1.7ML ~~LOC~~ SOLN
SUBCUTANEOUS | Status: AC
  Filled 2017-10-20: qty 1.7

## 2017-10-20 NOTE — Telephone Encounter (Signed)
CALLED PATIENT TO INFORM OF APPT. WITH DR. HARKIN ON 10-21-17 - ARRIVAL TIME - 1:15 PM - ADDRESS- [address], [address] , [place]. NUMBER [phone], PATIENT DECLINED APPT., CALLED DR. HARKINS' OFFICE TO GET PATIENT A NEW APPT., LVM FOR A RETURN CALL

## 2017-10-20 NOTE — Patient Instructions (Signed)
[facility] Discharge Instructions for Patients Receiving Chemotherapy  Today you received the following chemotherapy agents Opdivo  To help prevent nausea and vomiting after your treatment, we encourage you to take your nausea medication as directed   If you develop nausea and vomiting that is not controlled by your nausea medication, call the clinic.   BELOW ARE SYMPTOMS THAT SHOULD BE REPORTED IMMEDIATELY:  *FEVER GREATER THAN 100.5 F  *CHILLS WITH OR WITHOUT FEVER  NAUSEA AND VOMITING THAT IS NOT CONTROLLED WITH YOUR NAUSEA MEDICATION  *UNUSUAL SHORTNESS OF BREATH  *UNUSUAL BRUISING OR BLEEDING  TENDERNESS IN MOUTH AND THROAT WITH OR WITHOUT PRESENCE OF ULCERS  *URINARY PROBLEMS  *BOWEL PROBLEMS  UNUSUAL RASH Items with * indicate a potential emergency and should be followed up as soon as possible.  Feel free to call the clinic should you have any questions or concerns. The clinic phone number is [phone].  Please show the CHEMO ALERT CARD at check-in to the Emergency Department and triage nurse.   

## 2017-10-20 NOTE — Progress Notes (Signed)
[place] restarted.

## 2017-10-20 NOTE — Progress Notes (Signed)
Edward [phone]   [phone]  OFFICE PROGRESS NOTE  [name], FNP [address] [place] [address]  DIAGNOSIS: Stage IV (T3, N3, M1b) non-small cell lung cancer, adenocarcinoma diagnosed in August 2017 and presented with right upper lobe lung nodule as well as large right superior mediastinal and right paratracheal lymphadenopathy and bone metastases in the right iliac and L5 vertebral body.  GUARDANT 360: Negative for EGFR, ALK, ROS 1, BRAF mutations but showed amplification of EGFR and MET  PRIOR THERAPY: 1) Palliative systemic chemotherapy to the right superior mediastinal and right paratracheal lymphadenopathy under the care of Dr. Lisbeth Renshaw. 2) Systemic chemotherapy with carboplatin for AUC of 5, Alimta 500 MG/M2 and Avastin 15 MG/KG every 3 weeks is status post 6 cycles. First cycle was given on 07/28/2016.  3) Maintenance systemic chemotherapy with Alimta 500 MG/M2 and Avastin 15 MG/KG every 3 weeks. First dose 11/19/2016. Status post 3 cycles. Avastin was discontinued after cycle #3 secondary to development of right upper lobe cavitary lesion. Treatment is currently on hold secondary to the cavitary lesion in the right upper lobe.   CURRENT THERAPY: Second line treatment with immunotherapy with Nivolumab 480 mg IV every 4 weeks.  First cycle August 25, 2017.  Status post 2 cycles.  INTERVAL HISTORY: Edward Spence 62 y.o. male returns to the clinic today for follow-up visit.  The patient is feeling fine today with no specific complaints except for chronic back pain.  He is currently on MS Contin and OxyIR.  The patient continues to tolerate his treatment with immunotherapy with Nivolumab fairly well.  He had hypersensitivity reaction during cycle #2 but he improved and was able to complete the treatment.  He denied having any current chest pain, shortness of breath, cough or hemoptysis.  He denied having any fever or chills.  He has  no significant weight loss or night sweats.  He is here today for evaluation before starting cycle #3.   MEDICAL HISTORY: Past Medical History:  Diagnosis Date  . Cavitary pneumonia 01/21/2017  . Constipation 07/23/2016  . Encounter for antineoplastic chemotherapy 06/24/2016  . Lung cancer ([place])   . Lung mass 05/19/2016  . Pneumonia    in college    ALLERGIES:  is allergic to penicillins.  MEDICATIONS:  Current Outpatient Medications  Medication Sig Dispense Refill  . albuterol (PROVENTIL HFA;VENTOLIN HFA) 108 (90 Base) MCG/ACT inhaler Inhale 2 puffs into the lungs every 6 (six) hours as needed for wheezing or shortness of breath. 1 Inhaler 2  . morphine (MS CONTIN) 60 MG 12 hr tablet Take 1 tablet (60 mg total) by mouth 3 (three) times daily. 90 tablet 0  . oxyCODONE (ROXICODONE) 15 MG immediate release tablet Take 1 tablet (15 mg total) by mouth every 4 (four) hours as needed. 120 tablet 0  . Respiratory Therapy Supplies (FLUTTER) DEVI USE AS DIRECTED 1 each 0   No current facility-administered medications for this visit.     SURGICAL HISTORY:  Past Surgical History:  Procedure Laterality Date  . APPENDECTOMY    . VIDEO BRONCHOSCOPY Bilateral 02/27/2017   Procedure: VIDEO BRONCHOSCOPY WITH FLUORO;  Surgeon: Marshell Garfinkel, MD;  Location: [facility];  Service: Cardiopulmonary;  Laterality: Bilateral;  . VIDEO BRONCHOSCOPY WITH ENDOBRONCHIAL ULTRASOUND N/A 05/22/2016   Procedure: VIDEO BRONCHOSCOPY WITH ENDOBRONCHIAL ULTRASOUND;  Surgeon: [name], MD;  Location: [facility];  Service: Thoracic;  Laterality: N/A;    REVIEW OF SYSTEMS:  A comprehensive review of systems was negative except for: Musculoskeletal: positive for back pain   PHYSICAL EXAMINATION: General appearance: alert, cooperative and no distress Head: Normocephalic, without obvious abnormality, atraumatic Neck: no adenopathy, no JVD, supple, symmetrical, trachea midline and thyroid not enlarged, symmetric,  no tenderness/mass/nodules Lymph nodes: Cervical, supraclavicular, and axillary nodes normal. Resp: clear to auscultation bilaterally Back: symmetric, no curvature. ROM normal. No CVA tenderness. Cardio: regular rate and rhythm, S1, S2 normal, no murmur, click, rub or gallop GI: soft, non-tender; bowel sounds normal; no masses,  no organomegaly Extremities: extremities normal, atraumatic, no cyanosis or edema  ECOG PERFORMANCE STATUS: 1 - Symptomatic but completely ambulatory  Blood pressure 129/69, pulse (!) 102, temperature 98 F (36.7 C), temperature source Oral, resp. rate 18, height '6\' 3"'$  (1.905 m), weight 159 lb 6.4 oz (72.3 kg), SpO2 100 %.  LABORATORY DATA: Lab Results  Component Value Date   WBC 4.3 10/20/2017   HGB 11.8 (L) 10/20/2017   HCT 39.0 10/20/2017   MCV 87.2 10/20/2017   PLT 220 10/20/2017      Chemistry      Component Value Date/Time   NA 137 10/20/2017 1314   NA 139 09/24/2017 0930   K 4.1 10/20/2017 1314   K 3.7 09/24/2017 0930   CL 103 10/20/2017 1314   CO2 26 10/20/2017 1314   CO2 24 09/24/2017 0930   BUN 9 10/20/2017 1314   BUN 7.6 09/24/2017 0930   CREATININE 0.67 (L) 10/20/2017 1314   CREATININE 0.7 09/24/2017 0930      Component Value Date/Time   CALCIUM 9.5 10/20/2017 1314   CALCIUM 9.2 09/24/2017 0930   ALKPHOS 68 10/20/2017 1314   ALKPHOS 63 09/24/2017 0930   AST 13 10/20/2017 1314   AST 11 09/24/2017 0930   ALT 9 10/20/2017 1314   ALT 6 09/24/2017 0930   BILITOT 0.3 10/20/2017 1314   BILITOT 0.36 09/24/2017 0930       RADIOGRAPHIC STUDIES: No results found.  ASSESSMENT AND PLAN:  This is a very pleasant 62 years old white male with a stage IV non-small cell lung cancer, adenocarcinoma status post 6 cycles of induction systemic chemotherapy with carbo platinum, Alimta and Avastin with partial response. This was followed by maintenance treatment with Alimta and Avastin for 3 cycles and his treatment was discontinued secondary to  development of cavitary pneumonia in the right upper lobe. He was on observation for several months followed by disease progression on imaging studies. The patient is currently undergoing second line treatment with Nivolumab 480 mg IV every 4 weeks status post 2 cycles.   He continues to tolerate the treatment well.  I recommended for him to proceed with cycle #3 today as a schedule. I will see him back for follow-up visit in 4 weeks for evaluation after repeating CT scan of the chest, abdomen and pelvis for restaging of his disease. For pain management he will continue on MS Contin and oxycodone. 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 The patient was advised to call immediately if he has any concerning symptoms in the interval. The patient voices understanding of current disease status and treatment options and is in agreement with the current care plan. All questions were answered. The patient knows to call the clinic with any problems, questions or concerns. We can certainly see the patient much sooner if necessary.  Disclaimer: This note was dictated with voice recognition software. Similar sounding words can inadvertently be transcribed  and may not be corrected upon review.

## 2017-10-20 NOTE — Progress Notes (Signed)
Patient had hypersensitivity reaction during last treatment. Spoke with Dr. Julien Nordmann. Advised to proceed with treatment without pre-meds.   1600-Patient began c/o "not feeling right." Noted erythema to chest and face. Infusion paused and hypersensitivity protocol initiated. Sandi Mealy, PA-C came to treatment room to see patient. Meds given per [facility].   1630-Patient states he feels better, but does not feel he has returned to baseline. Sandi Mealy, PA-C advises to resume infusion @ 1645 at 50% of initial rate. Report given to Rogue Jury, RN with these details highlighted.

## 2017-10-21 ENCOUNTER — Telehealth: Payer: Self-pay | Admitting: Internal Medicine

## 2017-10-21 NOTE — Telephone Encounter (Signed)
Scheduled appt per 1/22 los - patient to get an updated schedule next visit.

## 2017-10-23 NOTE — Progress Notes (Signed)
[facility] Progress Note   Edward Spence [id number] May 08, 1956 62 y.o.  [name] is managed by Dr. Eilleen Kempf  Actively treated with chemotherapy: yes  Current Therapy: Nivolumab  Last Treated: 10/20/2017  Assessment: Plan:    Adverse effect of chemotherapy, initial encounter  Edward Spence was seen in the infusion room for a suspected chemotherapy reaction. He was receiving nivolumab at the time of his reaction. He had received a total of 5 minutes of therapy prior to onset of symptoms. His symptoms included: Shortness of breath and chest tightness. He was not premedicated prior to starting chemotherapy. Nivolumab was paused and Edward Spence was given Solu-Medrol 125 mg IV, Benadryl 20 mg IV, and Benadryl 50 mg IV after onset of his symptoms. [name] did  respond to intervention.  He was able to complete the remainder of his infusion with out ill effects.  Please see After Visit Summary for patient specific instructions.  Future Appointments  Date Time Provider [place]  11/17/2017 10:00 AM [facility] LAB 2 [facility] None  11/17/2017 10:30 AM Curt Bears, MD [facility] None  11/17/2017 11:30 AM [facility] C10 [facility] None  12/15/2017  9:00 AM [facility] LAB 2 [facility] None  12/15/2017  9:30 AM Curt Bears, MD [facility] None  12/15/2017 10:30 AM [facility] A2 [facility] None  01/12/2018 10:00 AM [facility] LAB 1 [facility] None  01/12/2018 10:30 AM Curt Bears, MD [facility] None  01/12/2018 11:30 AM [facility] G24 [facility] None    No orders of the defined types were placed in this encounter.      Subjective:   Patient ID:  Edward Spence is a 62 y.o. (DOB 05-Jan-1956) male.  Chief Complaint: No chief complaint on file.   HPI Edward Spence was seen in the infusion room for a suspected chemotherapy reaction. He was receiving nivolumab at the time of his  reaction. He had received a total of 5 minutes of therapy prior to onset of symptoms. His symptoms included: Shortness of breath and chest tightness. He was not premedicated prior to starting chemotherapy. Nivolumab was paused and Edward Spence was given Solu-Medrol 125 mg IV, Benadryl 20 mg IV, and Benadryl 50 mg IV after onset of his symptoms. [name] did  respond to intervention.  He was able to complete the remainder of his infusion with out ill effects.  Medications: I have reviewed the patient's current medications.  Allergies:  Allergies  Allergen Reactions  . Penicillins Hives    Had penicillin it at age 65 and " I got trench mouth and hives " Has patient had a PCN reaction causing immediate rash, facial/tongue/throat swelling, SOB or lightheadedness with hypotension: No Has patient had a PCN reaction causing severe rash involving mucus membranes or skin necrosis: Yes Has patient had a PCN reaction that required hospitalization: No Has patient had a PCN reaction occurring within the last 10 years: No     Past Medical History:  Diagnosis Date  . Cavitary pneumonia 01/21/2017  . Constipation 07/23/2016  . Encounter for antineoplastic chemotherapy 06/24/2016  . Lung cancer ([place])   . Lung mass 05/19/2016  . Pneumonia    in college    Past Surgical History:  Procedure Laterality Date  . APPENDECTOMY    . VIDEO BRONCHOSCOPY Bilateral 02/27/2017   Procedure: VIDEO BRONCHOSCOPY WITH FLUORO;  Surgeon: Marshell Garfinkel, MD;  Location: [facility];  Service: Cardiopulmonary;  Laterality: Bilateral;  . VIDEO BRONCHOSCOPY WITH ENDOBRONCHIAL ULTRASOUND  N/A 05/22/2016   Procedure: VIDEO BRONCHOSCOPY WITH ENDOBRONCHIAL ULTRASOUND;  Surgeon: [name], MD;  Location: [facility] OR;  Service: Thoracic;  Laterality: N/A;    Family History  Problem Relation Age of Onset  . Cancer Mother   . COPD Father   . Heart failure Father     Social History   Socioeconomic History    . Marital status: Divorced    Spouse name: Not on file  . Number of children: Not on file  . Years of education: Not on file  . Highest education level: Not on file  Social Needs  . Financial resource strain: Not on file  . Food insecurity - worry: Not on file  . Food insecurity - inability: Not on file  . Transportation needs - medical: Not on file  . Transportation needs - non-medical: Not on file  Occupational History  . Not on file  Tobacco Use  . Smoking status: Current Every Day Smoker    Packs/day: 0.50    Years: 40.00    Pack years: 20.00    Types: Cigarettes    Last attempt to quit: 04/29/2016    Years since quitting: 1.4  . Smokeless tobacco: Never Used  . Tobacco comment: 1-5 per day  Substance and Sexual Activity  . Alcohol use: No  . Drug use: No  . Sexual activity: Not on file  Other Topics Concern  . Not on file  Social History Narrative  . Not on file    Past Medical History, Surgical history, Social history, and Family history were reviewed and updated as appropriate.   Please see review of systems for further details on the patient's review from today.   Review of Systems:  Review of Systems  Constitutional: Negative for chills, diaphoresis and fever.  HENT: Negative for trouble swallowing.   Respiratory: Positive for chest tightness and shortness of breath. Negative for cough, choking and wheezing.   Cardiovascular: Negative for chest pain and palpitations.  Gastrointestinal: Negative for nausea and vomiting.  Musculoskeletal: Negative for back pain.  Skin: Negative for color change and rash.    Objective:   Physical Exam:  There were no vitals taken for this visit.  Physical Exam  Constitutional: No distress.  HENT:  Head: Normocephalic and atraumatic.  Cardiovascular: Normal rate, regular rhythm and normal heart sounds. Exam reveals no gallop and no friction rub.  No murmur heard. Pulmonary/Chest: Effort normal and breath sounds normal.  No respiratory distress. He has no wheezes. He has no rales.  Neurological: He is alert.  Skin: Skin is warm and dry. No rash noted. He is not diaphoretic. No erythema.    Lab Review:     Component Value Date/Time   NA 137 10/20/2017 1314   NA 139 09/24/2017 0930   K 4.1 10/20/2017 1314   K 3.7 09/24/2017 0930   CL 103 10/20/2017 1314   CO2 26 10/20/2017 1314   CO2 24 09/24/2017 0930   GLUCOSE 93 10/20/2017 1314   GLUCOSE 85 09/24/2017 0930   BUN 9 10/20/2017 1314   BUN 7.6 09/24/2017 0930   CREATININE 0.67 (L) 10/20/2017 1314   CREATININE 0.7 09/24/2017 0930   CALCIUM 9.5 10/20/2017 1314   CALCIUM 9.2 09/24/2017 0930   PROT 7.3 10/20/2017 1314   PROT 7.6 09/24/2017 0930   ALBUMIN 3.3 (L) 10/20/2017 1314   ALBUMIN 3.1 (L) 09/24/2017 0930   AST 13 10/20/2017 1314   AST 11 09/24/2017 0930   ALT 9 10/20/2017  1314   ALT 6 09/24/2017 0930   ALKPHOS 68 10/20/2017 1314   ALKPHOS 63 09/24/2017 0930   BILITOT 0.3 10/20/2017 1314   BILITOT 0.36 09/24/2017 0930   GFRNONAA >60 10/20/2017 1314   GFRAA >60 10/20/2017 1314       Component Value Date/Time   WBC 4.3 10/20/2017 1314   RBC 4.47 10/20/2017 1314   HGB 11.8 (L) 10/20/2017 1314   HGB 11.4 (L) 09/24/2017 0930   HCT 39.0 10/20/2017 1314   HCT 37.1 (L) 09/24/2017 0930   PLT 220 10/20/2017 1314   PLT 211 09/24/2017 0930   MCV 87.2 10/20/2017 1314   MCV 89.0 09/24/2017 0930   MCH 26.4 (L) 10/20/2017 1314   MCHC 30.3 (L) 10/20/2017 1314   RDW 17.2 (H) 10/20/2017 1314   RDW 16.6 (H) 09/24/2017 0930   LYMPHSABS 0.7 (L) 10/20/2017 1314   LYMPHSABS 0.5 (L) 09/24/2017 0930   MONOABS 0.4 10/20/2017 1314   MONOABS 0.3 09/24/2017 0930   EOSABS 0.2 10/20/2017 1314   EOSABS 0.1 09/24/2017 0930   BASOSABS 0.0 10/20/2017 1314   BASOSABS 0.0 09/24/2017 0930   -------------------------------  Imaging from last 24 hours (if applicable):  Radiology interpretation: No results found.

## 2017-10-29 ENCOUNTER — Telehealth: Payer: Self-pay | Admitting: Pulmonary Disease

## 2017-10-29 MED ORDER — ALBUTEROL SULFATE HFA 108 (90 BASE) MCG/ACT IN AERS: 2.0000 | INHALATION_SPRAY | Freq: Four times a day (QID) | RESPIRATORY_TRACT | 2 refills | Status: DC | PRN

## 2017-10-29 NOTE — Telephone Encounter (Signed)
Called pt stating I was sending an Rx into his pharmacy and verified I had the correct pharmacy for pt.  Pt expressed understanding. Nothing further needed at this time.

## 2017-11-10 ENCOUNTER — Other Ambulatory Visit: Payer: Self-pay | Admitting: Medical Oncology

## 2017-11-10 DIAGNOSIS — J189 Pneumonia, unspecified organism: Secondary | ICD-10-CM

## 2017-11-10 DIAGNOSIS — Z5111 Encounter for antineoplastic chemotherapy: Secondary | ICD-10-CM

## 2017-11-10 DIAGNOSIS — C3411 Malignant neoplasm of upper lobe, right bronchus or lung: Secondary | ICD-10-CM

## 2017-11-10 DIAGNOSIS — J984 Other disorders of lung: Secondary | ICD-10-CM

## 2017-11-10 MED ORDER — OXYCODONE HCL 15 MG PO TABS: 15.0000 mg | ORAL_TABLET | ORAL | 0 refills | Status: DC | PRN

## 2017-11-10 MED ORDER — MORPHINE SULFATE ER 60 MG PO TBCR: 60.0000 mg | EXTENDED_RELEASE_TABLET | Freq: Three times a day (TID) | ORAL | 0 refills | Status: DC

## 2017-11-16 ENCOUNTER — Ambulatory Visit (HOSPITAL_COMMUNITY)
Admission: RE | Admit: 2017-11-16 | Discharge: 2017-11-16 | Disposition: A | Discharge status: Home / Self Care | Payer: Medicaid Other | Source: Ambulatory Visit | Attending: Internal Medicine | Admitting: Internal Medicine

## 2017-11-16 DIAGNOSIS — C761 Malignant neoplasm of thorax: Secondary | ICD-10-CM | POA: Diagnosis not present

## 2017-11-16 DIAGNOSIS — I313 Pericardial effusion (noninflammatory): Secondary | ICD-10-CM | POA: Insufficient documentation

## 2017-11-16 DIAGNOSIS — C3490 Malignant neoplasm of unspecified part of unspecified bronchus or lung: Secondary | ICD-10-CM | POA: Insufficient documentation

## 2017-11-16 DIAGNOSIS — C787 Secondary malignant neoplasm of liver and intrahepatic bile duct: Secondary | ICD-10-CM | POA: Insufficient documentation

## 2017-11-16 MED ORDER — IOPAMIDOL (ISOVUE-300) INJECTION 61%
100.0000 mL | Freq: Once | INTRAVENOUS | Status: AC | PRN
  Administered 2017-11-16: 100 mL via INTRAVENOUS

## 2017-11-17 ENCOUNTER — Telehealth: Payer: Self-pay | Admitting: Internal Medicine

## 2017-11-17 ENCOUNTER — Inpatient Hospital Stay: Payer: Medicaid Other | Attending: Internal Medicine | Admitting: Internal Medicine

## 2017-11-17 ENCOUNTER — Encounter: Payer: Self-pay | Admitting: Internal Medicine

## 2017-11-17 ENCOUNTER — Telehealth: Payer: Self-pay | Admitting: Pharmacist

## 2017-11-17 ENCOUNTER — Inpatient Hospital Stay: Payer: Medicaid Other

## 2017-11-17 VITALS — BP 99/61 | HR 103 | Temp 98.6°F | Ht 75.0 in | Wt 169.0 lb

## 2017-11-17 DIAGNOSIS — M545 Low back pain: Secondary | ICD-10-CM | POA: Insufficient documentation

## 2017-11-17 DIAGNOSIS — R5382 Chronic fatigue, unspecified: Secondary | ICD-10-CM

## 2017-11-17 DIAGNOSIS — J910 Malignant pleural effusion: Secondary | ICD-10-CM | POA: Insufficient documentation

## 2017-11-17 DIAGNOSIS — C3411 Malignant neoplasm of upper lobe, right bronchus or lung: Secondary | ICD-10-CM | POA: Diagnosis not present

## 2017-11-17 DIAGNOSIS — R0609 Other forms of dyspnea: Secondary | ICD-10-CM | POA: Insufficient documentation

## 2017-11-17 DIAGNOSIS — I313 Pericardial effusion (noninflammatory): Secondary | ICD-10-CM | POA: Diagnosis not present

## 2017-11-17 DIAGNOSIS — C787 Secondary malignant neoplasm of liver and intrahepatic bile duct: Secondary | ICD-10-CM | POA: Diagnosis not present

## 2017-11-17 DIAGNOSIS — Z5111 Encounter for antineoplastic chemotherapy: Secondary | ICD-10-CM

## 2017-11-17 DIAGNOSIS — C7951 Secondary malignant neoplasm of bone: Secondary | ICD-10-CM | POA: Diagnosis not present

## 2017-11-17 DIAGNOSIS — Z5112 Encounter for antineoplastic immunotherapy: Secondary | ICD-10-CM | POA: Diagnosis present

## 2017-11-17 LAB — CBC WITH DIFFERENTIAL/PLATELET
Basophils Absolute: 0 10*3/uL (ref 0.0–0.1)
Basophils Relative: 1 %
EOS PCT: 5 %
Eosinophils Absolute: 0.2 10*3/uL (ref 0.0–0.5)
HCT: 36 % — ABNORMAL LOW (ref 38.4–49.9)
Hemoglobin: 11.2 g/dL — ABNORMAL LOW (ref 13.0–17.1)
LYMPHS ABS: 0.7 10*3/uL — AB (ref 0.9–3.3)
LYMPHS PCT: 17 %
MCH: 26.9 pg — AB (ref 27.2–33.4)
MCHC: 31.1 g/dL — AB (ref 32.0–36.0)
MCV: 86.5 fL (ref 79.3–98.0)
MONO ABS: 0.5 10*3/uL (ref 0.1–0.9)
MONOS PCT: 13 %
Neutro Abs: 2.5 10*3/uL (ref 1.5–6.5)
Neutrophils Relative %: 64 %
PLATELETS: 192 10*3/uL (ref 140–400)
RBC: 4.16 MIL/uL — AB (ref 4.20–5.82)
RDW: 18.5 % — ABNORMAL HIGH (ref 11.0–14.6)
WBC: 4 10*3/uL (ref 4.0–10.3)

## 2017-11-17 LAB — COMPREHENSIVE METABOLIC PANEL
ALT: 7 U/L (ref 0–55)
AST: 22 U/L (ref 5–34)
Albumin: 3.4 g/dL — ABNORMAL LOW (ref 3.5–5.0)
Alkaline Phosphatase: 77 U/L (ref 40–150)
Anion gap: 9 (ref 3–11)
BUN: 13 mg/dL (ref 7–26)
CHLORIDE: 106 mmol/L (ref 98–109)
CO2: 22 mmol/L (ref 22–29)
CREATININE: 0.72 mg/dL (ref 0.70–1.30)
Calcium: 9.1 mg/dL (ref 8.4–10.4)
GFR calc Af Amer: >60 mL/min (ref >60)
GFR calc non Af Amer: >60 mL/min (ref >60)
GLUCOSE: 88 mg/dL (ref 70–140)
POTASSIUM: 4.4 mmol/L (ref 3.5–5.1)
SODIUM: 137 mmol/L (ref 136–145)
Total Bilirubin: 0.3 mg/dL (ref 0.2–1.2)
Total Protein: 7 g/dL (ref 6.4–8.3)

## 2017-11-17 LAB — TSH: TSH: 2.235 u[IU]/mL (ref 0.320–4.118)

## 2017-11-17 MED ORDER — CRIZOTINIB 250 MG PO CAPS: 250.0000 mg | ORAL_CAPSULE | Freq: Two times a day (BID) | ORAL | 2 refills | Status: AC

## 2017-11-17 MED FILL — XALKORI 250 MG CAPSULE: 250 | 30 days supply | Qty: 60 | Fill #0

## 2017-11-17 NOTE — Progress Notes (Signed)
[place] [phone]   [phone]  OFFICE PROGRESS NOTE  Edward Backbone, FNP [address] [place] [address]  DIAGNOSIS: Stage IV (T3, N3, M1b) non-small cell lung cancer, adenocarcinoma diagnosed in August 2017 and presented with right upper lobe lung nodule as well as large right superior mediastinal and right paratracheal lymphadenopathy and bone metastases in the right iliac and L5 vertebral body.  GUARDANT 360: Negative for EGFR, ALK, ROS 1, BRAF mutations but showed amplification of EGFR and MET  PRIOR THERAPY: 1) Palliative systemic chemotherapy to the right superior mediastinal and right paratracheal lymphadenopathy under the care of Dr. Lisbeth Renshaw. 2) Systemic chemotherapy with carboplatin for AUC of 5, Alimta 500 MG/M2 and Avastin 15 MG/KG every 3 weeks is status post 6 cycles. First cycle was given on 07/28/2016.  3) Maintenance systemic chemotherapy with Alimta 500 MG/M2 and Avastin 15 MG/KG every 3 weeks. First dose 11/19/2016. Status post 3 cycles. Avastin was discontinued after cycle #3 secondary to development of right upper lobe cavitary lesion. Treatment is currently on hold secondary to the cavitary lesion in the right upper lobe. 4) palliative radiotherapy to metastatic bone disease. 5) Second line treatment with immunotherapy with Nivolumab 480 mg IV every 4 weeks.  First cycle August 25, 2017.  Status post 3 cycles.  This was discontinued today secondary to disease progression.  CURRENT THERAPY: Xalkori 250 mg p.o. twice daily for radiation with non-small cell lung cancer and MET amplification.  Expected to start in the next few days  INTERVAL HISTORY: Edward Spence 62 y.o. male returns to the clinic today for follow-up visit.  The patient is feeling fine today with no specific complaints except for the lower back pain with radiation to the hip bilaterally.  He received palliative radiotherapy to this area in the past.  He has  been on treatment with immunotherapy with Nivolumab status post 3 cycles and tolerated this treatment fairly well.  He denied having any current chest pain but has shortness of breath with exertion with no cough or hemoptysis.  He denied having any fever or chills.  He has no nausea, vomiting, diarrhea or constipation.  The patient had repeat CT scan of the chest, abdomen and pelvis performed recently and he is here for evaluation and discussion of his discuss results.   MEDICAL HISTORY: Past Medical History:  Diagnosis Date  . Cavitary pneumonia 01/21/2017  . Constipation 07/23/2016  . Encounter for antineoplastic chemotherapy 06/24/2016  . Lung cancer ([facility])   . Lung mass 05/19/2016  . Pneumonia    in college    ALLERGIES:  is allergic to penicillins.  MEDICATIONS:  Current Outpatient Medications  Medication Sig Dispense Refill  . albuterol (PROVENTIL HFA;VENTOLIN HFA) 108 (90 Base) MCG/ACT inhaler Inhale 2 puffs into the lungs every 6 (six) hours as needed for wheezing or shortness of breath. 1 Inhaler 2  . morphine (MS CONTIN) 60 MG 12 hr tablet Take 1 tablet (60 mg total) by mouth 3 (three) times daily. 90 tablet 0  . oxyCODONE (ROXICODONE) 15 MG immediate release tablet Take 1 tablet (15 mg total) by mouth every 4 (four) hours as needed. 120 tablet 0  . Respiratory Therapy Supplies (FLUTTER) DEVI USE AS DIRECTED 1 each 0   No current facility-administered medications for this visit.     SURGICAL HISTORY:  Past Surgical History:  Procedure Laterality Date  . APPENDECTOMY    . VIDEO BRONCHOSCOPY Bilateral 02/27/2017  Procedure: VIDEO BRONCHOSCOPY WITH FLUORO;  Surgeon: Marshell Garfinkel, MD;  Location: [facility];  Service: Cardiopulmonary;  Laterality: Bilateral;  . VIDEO BRONCHOSCOPY WITH ENDOBRONCHIAL ULTRASOUND N/A 05/22/2016   Procedure: VIDEO BRONCHOSCOPY WITH ENDOBRONCHIAL ULTRASOUND;  Surgeon: [name], MD;  Location: [facility];  Service: Thoracic;  Laterality: N/A;      REVIEW OF SYSTEMS:  Constitutional: positive for fatigue Eyes: negative Ears, nose, mouth, throat, and face: negative Respiratory: positive for dyspnea on exertion Cardiovascular: negative Gastrointestinal: negative Genitourinary:negative Integument/breast: negative Hematologic/lymphatic: negative Musculoskeletal:positive for back pain Neurological: negative Behavioral/Psych: negative Endocrine: negative Allergic/Immunologic: negative   PHYSICAL EXAMINATION: General appearance: alert, cooperative and no distress Head: Normocephalic, without obvious abnormality, atraumatic Neck: no adenopathy, no JVD, supple, symmetrical, trachea midline and thyroid not enlarged, symmetric, no tenderness/mass/nodules Lymph nodes: Cervical, supraclavicular, and axillary nodes normal. Resp: clear to auscultation bilaterally Back: symmetric, no curvature. ROM normal. No CVA tenderness. Cardio: regular rate and rhythm, S1, S2 normal, no murmur, click, rub or gallop GI: soft, non-tender; bowel sounds normal; no masses,  no organomegaly Extremities: extremities normal, atraumatic, no cyanosis or edema Neurologic: Alert and oriented X 3, normal strength and tone. Normal symmetric reflexes. Normal coordination and gait  ECOG PERFORMANCE STATUS: 1 - Symptomatic but completely ambulatory  Blood pressure 99/61, pulse (!) 103, temperature 98.6 F (37 C), temperature source Oral, height '6\' 3"'$  (1.905 m), weight 169 lb (76.7 kg), SpO2 95 %.  LABORATORY DATA: Lab Results  Component Value Date   WBC 4.0 11/17/2017   HGB 11.2 (L) 11/17/2017   HCT 36.0 (L) 11/17/2017   MCV 86.5 11/17/2017   PLT 192 11/17/2017      Chemistry      Component Value Date/Time   NA 137 10/20/2017 1314   NA 139 09/24/2017 0930   K 4.1 10/20/2017 1314   K 3.7 09/24/2017 0930   CL 103 10/20/2017 1314   CO2 26 10/20/2017 1314   CO2 24 09/24/2017 0930   BUN 9 10/20/2017 1314   BUN 7.6 09/24/2017 0930   CREATININE 0.67 (L)  10/20/2017 1314   CREATININE 0.7 09/24/2017 0930      Component Value Date/Time   CALCIUM 9.5 10/20/2017 1314   CALCIUM 9.2 09/24/2017 0930   ALKPHOS 68 10/20/2017 1314   ALKPHOS 63 09/24/2017 0930   AST 13 10/20/2017 1314   AST 11 09/24/2017 0930   ALT 9 10/20/2017 1314   ALT 6 09/24/2017 0930   BILITOT 0.3 10/20/2017 1314   BILITOT 0.36 09/24/2017 0930       RADIOGRAPHIC STUDIES: Ct Chest W Contrast  Result Date: 11/16/2017 CLINICAL DATA:  Restaging of non-small cell lung cancer metastatic to bone. EXAM: CT CHEST, ABDOMEN, AND PELVIS WITH CONTRAST TECHNIQUE: Multidetector CT imaging of the chest, abdomen and pelvis was performed following the standard protocol during bolus administration of intravenous contrast. CONTRAST:  129m ISOVUE-300 IOPAMIDOL (ISOVUE-300) INJECTION 61% COMPARISON:  Multiple exams, including 08/06/2017 FINDINGS: The patient had an IV infiltration with contrast, volume estimated at 10 cc. This issue was managed by Dr. CCarlis Abbott Scanogram of the affected region confirms small volume infiltration. CT CHEST FINDINGS Cardiovascular: The enlarging mediastinal mass causes an increase degree of narrowing of the brachiocephalic vein on image 226/3compared to the prior exam. Strictly speaking, early invasion of the IVC by this mass cannot be readily excluded on image 22/2. There is no obvious thrombus within the brachiocephalic vein. Mild atherosclerotic calcification in the left anterior descending coronary artery. Large pericardial effusion, previously moderate  to large, malignant pericardial effusion not excluded. Mediastinum/Nodes: Anterior mediastinal mass 2.5 by 2.6 cm on image 20/2, formerly 1.8 by 1.7 cm. Right paratracheal lymph node 2.4 cm in short axis on image 19/2, formerly 1.5 cm. The enlargement of these lesions contributes to the narrowing of the brachiocephalic vein and SVC noted above. Left infrahilar lymph node 1.6 cm in short axis, previously 0.7 cm. Subcarinal  adenopathy 1.5 cm in short axis, previously 1.3 cm in conglomerate. Poor definition of tissue planes between the right upper mediastinal lesions and the right apical airspace opacity. There is contrast medium in the distal esophagus. Lungs/Pleura: Right upper lobe consolidation with bronchiectasis and cavitary disease not appreciably changed from the prior exam. There is some bandlike airspace opacity medially in the right lower lobe which is similar to the prior exam and possibly from prior radiation therapy. New and enlarging bilateral pulmonary nodules are observed; for example, a 5 mm right lower lobe pulmonary nodule medially on image 65/4 is new; a pleural-based 1.5 by 0.8 cm left lower lobe pulmonary nodule on image 82/4 previously measured 0.7 by 0.6 cm; and a 0.7 by 0.6 cm right middle lobe nodule on image 75/4 previously measured 0.5 by 0.4 cm. Musculoskeletal: Mild thoracic spondylosis. CT ABDOMEN PELVIS FINDINGS Hepatobiliary: Scattered hypodense lesions in the liver compatible with widespread hepatic metastatic disease. Over 20 lesions are identified. An index lesion in segment 4 of the liver measures approximately 2.4 by 2.3 cm, precise measurements are difficult due to indistinct margins of the lesions. Pancreas: Unremarkable Spleen: Unremarkable Adrenals/Urinary Tract: 0.9 by 0.6 cm right kidney upper pole hypodense lesion adrenal glands normal. Stomach/Bowel: Prominent stool throughout the colon favors constipation. Vascular/Lymphatic: Aortoiliac atherosclerotic vascular disease. Borderline enlarged periaortic lymph nodes including a 1.0 cm in short axis left periaortic lymph node on image 73/2. A left common iliac lymph node measures 1.0 cm in short axis on image 94/2. Reproductive: Unremarkable Other: No supplemental non-categorized findings. Musculoskeletal: Degenerative facet arthropathy in the lower lumbar spine. Sclerotic lesion in the L5 vertebral body appears enlarged, measuring 2.0 by 2.1  cm today and approximately 1.6 by 1.4 cm on 08/06/2017. There is some mild sclerosis along the superior endplate of S1 which is nonspecific. The sclerotic lesion along the iliac side of the right SI joint appears roughly similar in size and appearance to the prior exam IMPRESSION: 1. Progressive malignancy in the chest with enlarging mediastinal masses causing vascular compression of the brachiocephalic vein and SVC; and new and enlarging pulmonary nodules. Of note, the previous moderate to large pericardial effusion is now large in volume, malignant pericardial effusion cannot be readily excluded. 2. New widespread hepatic metastatic disease. 3. Borderline periaortic and left common iliac adenopathy in the abdomen. 4. Enlarging sclerotic lesion in the L5 vertebral body favoring metastatic lesion. The sclerotic lesion in the right iliac bone adjacent to the SI joint is stable. 5. 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 Electronically Signed   By: Van Clines M.D.   On: 11/16/2017 09:06   Ct Abdomen Pelvis W Contrast  Result Date: 11/16/2017 CLINICAL DATA:  Restaging of non-small cell lung cancer metastatic to bone. EXAM: CT CHEST, ABDOMEN, AND PELVIS WITH CONTRAST TECHNIQUE: Multidetector CT imaging of the chest, abdomen and pelvis was performed following the standard protocol during bolus administration of intravenous contrast. CONTRAST:  175m ISOVUE-300 IOPAMIDOL (ISOVUE-300) INJECTION 61% COMPARISON:  Multiple exams, including 08/06/2017 FINDINGS: The patient had an IV  infiltration with contrast, volume estimated at 10 cc. This issue was managed by Dr. Carlis Abbott. Scanogram of the affected region confirms small volume infiltration. CT CHEST FINDINGS Cardiovascular: The enlarging mediastinal mass causes an increase degree of narrowing of the brachiocephalic vein on image 09/9 compared to the prior exam. Strictly speaking, early  invasion of the IVC by this mass cannot be readily excluded on image 22/2. There is no obvious thrombus within the brachiocephalic vein. Mild atherosclerotic calcification in the left anterior descending coronary artery. Large pericardial effusion, previously moderate to large, malignant pericardial effusion not excluded. Mediastinum/Nodes: Anterior mediastinal mass 2.5 by 2.6 cm on image 20/2, formerly 1.8 by 1.7 cm. Right paratracheal lymph node 2.4 cm in short axis on image 19/2, formerly 1.5 cm. The enlargement of these lesions contributes to the narrowing of the brachiocephalic vein and SVC noted above. Left infrahilar lymph node 1.6 cm in short axis, previously 0.7 cm. Subcarinal adenopathy 1.5 cm in short axis, previously 1.3 cm in conglomerate. Poor definition of tissue planes between the right upper mediastinal lesions and the right apical airspace opacity. There is contrast medium in the distal esophagus. Lungs/Pleura: Right upper lobe consolidation with bronchiectasis and cavitary disease not appreciably changed from the prior exam. There is some bandlike airspace opacity medially in the right lower lobe which is similar to the prior exam and possibly from prior radiation therapy. New and enlarging bilateral pulmonary nodules are observed; for example, a 5 mm right lower lobe pulmonary nodule medially on image 65/4 is new; a pleural-based 1.5 by 0.8 cm left lower lobe pulmonary nodule on image 82/4 previously measured 0.7 by 0.6 cm; and a 0.7 by 0.6 cm right middle lobe nodule on image 75/4 previously measured 0.5 by 0.4 cm. Musculoskeletal: Mild thoracic spondylosis. CT ABDOMEN PELVIS FINDINGS Hepatobiliary: Scattered hypodense lesions in the liver compatible with widespread hepatic metastatic disease. Over 20 lesions are identified. An index lesion in segment 4 of the liver measures approximately 2.4 by 2.3 cm, precise measurements are difficult due to indistinct margins of the lesions. Pancreas:  Unremarkable Spleen: Unremarkable Adrenals/Urinary Tract: 0.9 by 0.6 cm right kidney upper pole hypodense lesion adrenal glands normal. Stomach/Bowel: Prominent stool throughout the colon favors constipation. Vascular/Lymphatic: Aortoiliac atherosclerotic vascular disease. Borderline enlarged periaortic lymph nodes including a 1.0 cm in short axis left periaortic lymph node on image 73/2. A left common iliac lymph node measures 1.0 cm in short axis on image 94/2. Reproductive: Unremarkable Other: No supplemental non-categorized findings. Musculoskeletal: Degenerative facet arthropathy in the lower lumbar spine. Sclerotic lesion in the L5 vertebral body appears enlarged, measuring 2.0 by 2.1 cm today and approximately 1.6 by 1.4 cm on 08/06/2017. There is some mild sclerosis along the superior endplate of S1 which is nonspecific. The sclerotic lesion along the iliac side of the right SI joint appears roughly similar in size and appearance to the prior exam IMPRESSION: 1. Progressive malignancy in the chest with enlarging mediastinal masses causing vascular compression of the brachiocephalic vein and SVC; and new and enlarging pulmonary nodules. Of note, the previous moderate to large pericardial effusion is now large in volume, malignant pericardial effusion cannot be readily excluded. 2. New widespread hepatic metastatic disease. 3. Borderline periaortic and left common iliac adenopathy in the abdomen. 4. Enlarging sclerotic lesion in the L5 vertebral body favoring metastatic lesion. The sclerotic lesion in the right iliac bone adjacent to the SI joint is stable. 5. Stable appearance of cavitary airspace opacity in the right upper lobe likely  partially from radiation pneumonitis. Electronically Signed   By: Van Clines M.D.   On: 11/16/2017 09:06    ASSESSMENT AND PLAN:  This is a very pleasant 62 years old white male with a stage IV non-small cell lung cancer, adenocarcinoma status post 6 cycles of  induction systemic chemotherapy with carbo platinum, Alimta and Avastin with partial response. This was followed by maintenance treatment with Alimta and Avastin for 3 cycles and his treatment was discontinued secondary to development of cavitary pneumonia in the right upper lobe. He was on observation for several months followed by disease progression on imaging studies. The patient was a started on second line treatment with Nivolumab 480 mg IV every 4 weeks status post 3 cycles discontinued secondary to disease progression.   He had repeat CT scan of the chest, abdomen and pelvis performed recently.  I personally and independently reviewed the scan images and discussed the results and showed the images to the patient today. Unfortunately his a scan showed evidence for disease progression with enlargement of the mediastinal masses as well as new widespread hepatic metastatic disease as well as enlarging malignant pleural effusion. I recommended for the patient to discontinue his current treatment with Nivolumab. I discussed with the patient other treatment options including palliative care versus systemic chemotherapy with docetaxel and Cyramza versus consideration of treatment with oral targeted treatment, Xalkori 250 mg p.o. twice daily as the patient has MET amplification his molecular studies. The patient is interested in treatment with Xalkori.  We discussed with him the adverse effect of this treatment.  He is expected to start this treatment in the next few days if approved by his insurance.  He was also seen by the pharmacist for the oral medication for more detailed discussion of this treatment options and she gave him a handout about the medication. For the pericardial effusion, I would refer the patient to cardiology for evaluation and consideration of 2D echo and drainage if needed. For pain management he will continue on MS Contin and oxycodone. For the metastatic bone disease the patient  will continue treatment with Xgeva. I will see him back for follow-up visit in 2 weeks for evaluation and repeat blood work. He was advised to call immediately if he has any concerning symptoms in the interval. The patient voices understanding of current disease status and treatment options and is in agreement with the current care plan. All questions were answered. The patient knows to call the clinic with any problems, questions or concerns. We can certainly see the patient much sooner if necessary.  Disclaimer: This note was dictated with voice recognition software. Similar sounding words can inadvertently be transcribed and may not be corrected upon review.

## 2017-11-17 NOTE — Telephone Encounter (Signed)
Oral Chemotherapy Pharmacist Encounter   I spoke with patient in exam room for overview of: Xalkori.   Pt is doing well. Counseled patient on administration, dosing, side effects, monitoring, drug-food interactions, safe handling, storage, and disposal.  Patient will take Xalkori 250mg  capsules, 1 capsule by mouth 2 times daily, with or with out food.  Patient knows to avoid grapefruit and grapefruit juice while on treatment with Xalkori.  Xalkori start date: 11/20/17  Side effects include but not limited to: diarrhea, nausea, constipation, fatigue, dizziness, decreased blood counts, abnormal electrolytes, hepatotoxicity, pulmonary toxicity, cardiac conduction changes, and visual disturbances.    Patient states he has anti-emetic at home from previous chemotherapy and will use it if he feels nauseous. Patient instructed to obtain antidiarrheal agent and to notify the office if experiencing 4 or more episodes of diarrhea daily.  Reviewed with patient importance of keeping a medication schedule and plan for any missed doses.  Edward Spence voiced understanding and appreciation.   All questions answered. Medication reconciliation performed and medication/allergy list updated.  Edward Spence will be ordered by [facility] today for shipment to patient's home tomorrow for delivery on 11/19/17, start date 11/20/17.  Copayment $3 for 1 month supply with patient's Medicaid prescription coverage.  Patient knows to call the office with questions or concerns. Oral Oncology Clinic will continue to follow.  Thank you,  Johny Drilling, PharmD, BCPS, BCOP 11/17/2017   12:05 PM [facility]cology Clinic [phone]

## 2017-11-17 NOTE — Telephone Encounter (Signed)
Appointment scheduled AVS/Calendar printed for patient.  I am scheduling Cardiology Appointment for patient and will call him with Date/Time per 2/19 LOS

## 2017-11-17 NOTE — Telephone Encounter (Signed)
Oral Oncology Pharmacist Encounter  Received new prescription for Xalkori (crizotinib) for the treatment of metastatic NSCLC with MET amplification, planned duration until disease progression or unacceptable toxicity.  Labs from Epic assessed, [place] [address] for treatment. Last EKG Aug 2017, QTc 434 msec, patient being referred to cardiology for work-up of pericardial effusion. Electrolytes will be closely monitored at follow-up. HRs in Epic assessed, generally trend to tachycardic, will continue to be monitored.  Current medication list in Epic reviewed, no significant DDIs with Xalkori identified.  Prescription has been e-scribed to the [facility] for benefits analysis and approval.  Oral Oncology Clinic will continue to follow for insurance authorization, copayment issues, initial counseling and start date.  Johny Drilling, PharmD, BCPS, BCOP 11/17/2017 11:50 AM Oral Oncology Clinic [phone]

## 2017-11-18 ENCOUNTER — Ambulatory Visit (HOSPITAL_BASED_OUTPATIENT_CLINIC_OR_DEPARTMENT_OTHER)
Admission: RE | Admit: 2017-11-18 | Discharge: 2017-11-18 | Disposition: A | Discharge status: Home / Self Care | Payer: Medicaid Other | Source: Ambulatory Visit | Attending: Family | Admitting: Family

## 2017-11-18 ENCOUNTER — Encounter: Payer: Self-pay | Admitting: Cardiology

## 2017-11-18 ENCOUNTER — Ambulatory Visit (INDEPENDENT_AMBULATORY_CARE_PROVIDER_SITE_OTHER): Payer: Medicaid Other | Admitting: Cardiology

## 2017-11-18 VITALS — BP 80/60 | HR 114 | Ht 75.0 in | Wt 166.0 lb

## 2017-11-18 DIAGNOSIS — I318 Other specified diseases of pericardium: Secondary | ICD-10-CM | POA: Diagnosis not present

## 2017-11-18 DIAGNOSIS — C801 Malignant (primary) neoplasm, unspecified: Secondary | ICD-10-CM | POA: Insufficient documentation

## 2017-11-18 DIAGNOSIS — I517 Cardiomegaly: Secondary | ICD-10-CM | POA: Diagnosis not present

## 2017-11-18 DIAGNOSIS — R0609 Other forms of dyspnea: Secondary | ICD-10-CM | POA: Insufficient documentation

## 2017-11-18 DIAGNOSIS — C3411 Malignant neoplasm of upper lobe, right bronchus or lung: Secondary | ICD-10-CM

## 2017-11-18 DIAGNOSIS — I313 Pericardial effusion (noninflammatory): Secondary | ICD-10-CM | POA: Insufficient documentation

## 2017-11-18 DIAGNOSIS — I3131 Malignant pericardial effusion in diseases classified elsewhere: Secondary | ICD-10-CM | POA: Insufficient documentation

## 2017-11-18 LAB — ECHOCARDIOGRAM COMPLETE
Height: 75 in
Weight: 2656 oz

## 2017-11-18 NOTE — Progress Notes (Signed)
Echocardiogram 2D Echocardiogram has been performed.  Joelene Millin 11/18/2017, 12:46 PM

## 2017-11-18 NOTE — Progress Notes (Signed)
Cardiology Consultation:    Date:  11/18/2017   ID:  Edward SOMERVILLE, DOB 1956-04-07, MRN [id number]  PCP:  [name], FNP  Cardiologist:  Jenne Campus, MD   Referring MD: Curt Bears, MD   Chief Complaint  Patient presents with  . Shortness of Breath  . Fluid on heart  I got fluid around my heart  History of Present Illness:    Edward Spence is a 62 y.o. male who is being seen today for the evaluation of pericardial effusion at the request of Curt Bears, MD.  Patient with metastatic lung cancer.  Recently he had CT of his chest done and that showed large pericardial effusion.  CAT scan done couple months before it showed only moderate.  He is doing fairly he said however his wife who is with him in the room tells me that he does get short of breath quite easily.  According to the patient there is no recent worsening of his condition.  He does have some difficulty breathing when he is laying flat many times he has to prop his head up or sits in the chair to help him breathing.  I in terms of ability to walk its rather poor because of shortness of breath and that has been that way for last few months.  Denies having any chest pain tightness squeezing pressure burning chest.  No palpitations no dizziness.  No swelling of lower extremities.  Past Medical History:  Diagnosis Date  . Cavitary pneumonia 01/21/2017  . Constipation 07/23/2016  . Encounter for antineoplastic chemotherapy 06/24/2016  . Lung cancer ([place])   . Lung mass 05/19/2016  . Pneumonia    in college    Past Surgical History:  Procedure Laterality Date  . APPENDECTOMY    . VIDEO BRONCHOSCOPY Bilateral 02/27/2017   Procedure: VIDEO BRONCHOSCOPY WITH FLUORO;  Surgeon: Marshell Garfinkel, MD;  Location: [facility];  Service: Cardiopulmonary;  Laterality: Bilateral;  . VIDEO BRONCHOSCOPY WITH ENDOBRONCHIAL ULTRASOUND N/A 05/22/2016   Procedure: VIDEO BRONCHOSCOPY WITH ENDOBRONCHIAL ULTRASOUND;   Surgeon: [name], MD;  Location: [facility] OR;  Service: Thoracic;  Laterality: N/A;    Current Medications: Current Meds  Medication Sig  . albuterol (PROVENTIL HFA;VENTOLIN HFA) 108 (90 Base) MCG/ACT inhaler Inhale 2 puffs into the lungs every 6 (six) hours as needed for wheezing or shortness of breath.  . crizotinib (XALKORI) 250 MG capsule Take 1 capsule (250 mg total) by mouth 2 (two) times daily.  Marland Kitchen morphine (MS CONTIN) 60 MG 12 hr tablet Take 1 tablet (60 mg total) by mouth 3 (three) times daily.  Marland Kitchen oxyCODONE (ROXICODONE) 15 MG immediate release tablet Take 1 tablet (15 mg total) by mouth every 4 (four) hours as needed.  Marland Kitchen Respiratory Therapy Supplies (FLUTTER) DEVI USE AS DIRECTED     Allergies:   Penicillins   Social History   Socioeconomic History  . Marital status: Divorced    Spouse name: None  . Number of children: None  . Years of education: None  . Highest education level: None  Social Needs  . Financial resource strain: None  . Food insecurity - worry: None  . Food insecurity - inability: None  . Transportation needs - medical: None  . Transportation needs - non-medical: None  Occupational History  . None  Tobacco Use  . Smoking status: Current Every Day Smoker    Packs/day: 1.00    Years: 40.00    Pack years: 40.00  Types: Cigarettes    Last attempt to quit: 04/29/2016    Years since quitting: 1.5  . Smokeless tobacco: Never Used  . Tobacco comment: 1-5 per day  Substance and Sexual Activity  . Alcohol use: No  . Drug use: No  . Sexual activity: None  Other Topics Concern  . None  Social History Narrative  . None     Family History: The patient's family history includes COPD in his father; Cancer in his mother; Heart failure in his father. ROS:   Please see the history of present illness.    All 14 point review of systems negative except as described per history of present illness.  EKGs/Labs/Other Studies Reviewed:    The following  studies were reviewed today: CT of his chest reviewed.  No evidence of metastatic disease as well as progression of the disease and large pericardial effusion.  EKG:  EKG is  ordered today.  The ekg ordered today demonstrates sinus tachycardia, normal QRS complex duration morphology, nonspecific ST segment changes  Recent Labs: 11/17/2017: ALT 7; BUN 13; Creatinine, Ser 0.72; Hemoglobin 11.2; Platelets 192; Potassium 4.4; Sodium 137; TSH 2.235  Recent Lipid Panel No results found for: CHOL, TRIG, HDL, CHOLHDL, VLDL, LDLCALC, LDLDIRECT  Physical Exam:    VS:  BP (!) 80/60 (BP Location: Left Arm, Patient Position: Sitting, Cuff Size: Normal)   Pulse (!) 114   Ht 6\' 3"  (1.905 m)   Wt 166 lb (75.3 kg)   SpO2 98%   BMI 20.75 kg/m     Wt Readings from Last 3 Encounters:  11/18/17 166 lb (75.3 kg)  11/17/17 169 lb (76.7 kg)  10/20/17 159 lb 6.4 oz (72.3 kg)     GEN:  Well nourished, well developed in no acute distress HEENT: Normal NECK: Severely enlarged; No carotid bruits LYMPHATICS: No lymphadenopathy CARDIAC: RRR, no murmurs, no rubs, no gallops tones are distant RESPIRATORY: Bilateral crackles wheezes and rhonchi especially on the left side. ABDOMEN: Soft, non-tender, non-distended, hepatomegaly about 2 fingers below costal margin. MUSCULOSKELETAL:  No edema; No deformity  SKIN: Warm and dry NEUROLOGIC:  Alert and oriented x 3 PSYCHIATRIC:  Normal affect   Check his blood pressure blood pressure was 95/55.  Pulses paradoxus between 10 and 15 mmHg  ASSESSMENT:    1. 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 Dyspnea on exertion    PLAN:    In order of problems listed above:  1. Pericardial effusion most likely malignant.  Will schedule him to have quick echocardiogram.  Will happen today at 12:00 in 2 hours.  Then after that will most likely make arrangements for a cardiac tract surgery to do a pericardial  window.  I will also confer with his oncologist trying to see if analysis of the fluid will make any difference is in terms of management of his main oncological problem. 2. Dyspnea on exertion: Multifactorial.  Pericardial effusion may play some significant role here therefore need to be taken care of.  This is purely palliative treatment with his metastatic lung disease. 3. Pulmonary malignancy: Followed by oncology.   Medication Adjustments/Labs and Tests Ordered: Current medicines are reviewed at length with the patient today.  Concerns regarding medicines are outlined above.  No orders of the defined types were placed in this encounter.  No orders of the defined types were placed in this encounter.   Signed, Park Liter, MD, [facility]. 11/18/2017  10:02 AM    [place]

## 2017-11-18 NOTE — Patient Instructions (Signed)
Medication Instructions:  Your physician recommends that you continue on your current medications as directed. Please refer to the Current Medication list given to you today.  Labwork: None ordered  Testing/Procedures: Your physician has requested that you have an echocardiogram. Echocardiography is a painless test that uses sound waves to create images of your heart. It provides your doctor with information about the size and shape of your heart and how well your heart's chambers and valves are working. This procedure takes approximately one hour. There are no restrictions for this procedure.  EKG today  Follow-Up: Your physician recommends that you schedule a follow-up appointment in: 3-4 week follow up with Dr. Agustin Cree   Any Other Special Instructions Will Be Listed Below (If Applicable).     If you need a refill on your cardiac medications before your next appointment, please call your pharmacy.

## 2017-11-24 ENCOUNTER — Ambulatory Visit (HOSPITAL_COMMUNITY)
Admission: RE | Admit: 2017-11-24 | Discharge: 2017-11-24 | Disposition: A | Discharge status: Home / Self Care | Payer: Medicaid Other | Source: Ambulatory Visit | Attending: Thoracic Surgery (Cardiothoracic Vascular Surgery) | Admitting: Thoracic Surgery (Cardiothoracic Vascular Surgery)

## 2017-11-24 ENCOUNTER — Other Ambulatory Visit: Payer: Self-pay | Admitting: *Deleted

## 2017-11-24 ENCOUNTER — Ambulatory Visit (INDEPENDENT_AMBULATORY_CARE_PROVIDER_SITE_OTHER): Payer: Medicaid Other | Admitting: Thoracic Surgery (Cardiothoracic Vascular Surgery)

## 2017-11-24 VITALS — BP 107/64 | HR 100 | Resp 20 | Ht 75.0 in | Wt 168.0 lb

## 2017-11-24 DIAGNOSIS — M7989 Other specified soft tissue disorders: Secondary | ICD-10-CM | POA: Insufficient documentation

## 2017-11-24 DIAGNOSIS — R590 Localized enlarged lymph nodes: Secondary | ICD-10-CM | POA: Diagnosis not present

## 2017-11-24 DIAGNOSIS — I3139 Other pericardial effusion (noninflammatory): Secondary | ICD-10-CM

## 2017-11-24 DIAGNOSIS — I313 Pericardial effusion (noninflammatory): Secondary | ICD-10-CM

## 2017-11-24 NOTE — H&P (View-Only) (Signed)
PCP is House, [name], FNP Referring Provider is House, [name], FNP  Chief Complaint  Patient presents with  . Pericardial Effusion    CT C/A/P 11/16/17, ECHO 11/18/17    HPI: Edward Spence is sent for consultation regarding a pericardial effusion.  Edward Spence is a 62 year old man with history of tobacco abuse who has stage IV lung cancer.  Recent CT of his chest showed a large pericardial effusion mostly along the diaphragmatic area.  He reportedly also complained of shortness of breath.  He had an echocardiogram and Dr. Wendy Poet office last week which showed a moderate to large pericardial effusion with no tamponade.  This was a free-flowing effusion.  He had normal left ventricular function.  Unfortunately his CT of the chest also showed progressive disease now with hepatic metastases.  He says he does not feel short of breath.  He does have a little orthopnea but that is not a new finding.  He has noted some swelling in his left foot and ankle today.  That is new.  He denies pain in the calf.  He says overall he feels better and has been gaining some weight.  Zubrod Score: At the time of surgery this patient's most appropriate activity status/level should be described as: []     0    Normal activity, no symptoms [x]     1    Restricted in physical strenuous activity but ambulatory, able to do out light work []     2    Ambulatory and capable of self care, unable to do work activities, up and about >50 % of waking hours                              []     3    Only limited self care, in bed greater than 50% of waking hours []     4    Completely disabled, no self care, confined to bed or chair []     5    Moribund  Past Medical History:  Diagnosis Date  . Cavitary pneumonia 01/21/2017  . Constipation 07/23/2016  . Encounter for antineoplastic chemotherapy 06/24/2016  . Lung cancer ([place])   . Lung mass 05/19/2016  . Pneumonia    in college    Past Surgical History:  Procedure  Laterality Date  . APPENDECTOMY    . VIDEO BRONCHOSCOPY Bilateral 02/27/2017   Procedure: VIDEO BRONCHOSCOPY WITH FLUORO;  Surgeon: Marshell Garfinkel, MD;  Location: [facility];  Service: Cardiopulmonary;  Laterality: Bilateral;  . VIDEO BRONCHOSCOPY WITH ENDOBRONCHIAL ULTRASOUND N/A 05/22/2016   Procedure: VIDEO BRONCHOSCOPY WITH ENDOBRONCHIAL ULTRASOUND;  Surgeon: [name], MD;  [facility];  Service: Thoracic;  Laterality: N/A;    Family History  Problem Relation Age of Onset  . Cancer Mother   . COPD Father   . Heart failure Father     Social History Social History   Tobacco Use  . Smoking status: Current Every Day Smoker    Packs/day: 1.00    Years: 40.00    Pack years: 40.00    Types: Cigarettes    Last attempt to quit: 04/29/2016    Years since quitting: 1.5  . Smokeless tobacco: Never Used  . Tobacco comment: 1-5 per day  Substance Use Topics  . Alcohol use: No  . Drug use: No    Current Outpatient Medications  Medication Sig Dispense Refill  . albuterol (PROVENTIL HFA;VENTOLIN HFA) 108 (  90 Base) MCG/ACT inhaler Inhale 2 puffs into the lungs every 6 (six) hours as needed for wheezing or shortness of breath. 1 Inhaler 2  . crizotinib (XALKORI) 250 MG capsule Take 1 capsule (250 mg total) by mouth 2 (two) times daily. 60 capsule 2  . morphine (MS CONTIN) 60 MG 12 hr tablet Take 1 tablet (60 mg total) by mouth 3 (three) times daily. 90 tablet 0  . oxyCODONE (ROXICODONE) 15 MG immediate release tablet Take 1 tablet (15 mg total) by mouth every 4 (four) hours as needed. 120 tablet 0  . Respiratory Therapy Supplies (FLUTTER) DEVI USE AS DIRECTED 1 each 0   No current facility-administered medications for this visit.     Allergies  Allergen Reactions  . Penicillins Hives    Had penicillin it at age 57 and " I got trench mouth and hives " Has patient had a PCN reaction causing immediate rash, facial/tongue/throat swelling, SOB or lightheadedness with  hypotension: No Has patient had a PCN reaction causing severe rash involving mucus membranes or skin necrosis: Yes Has patient had a PCN reaction that required hospitalization: No Has patient had a PCN reaction occurring within the last 10 years: No     Review of Systems  Constitutional: Positive for unexpected weight change (Has been gaining weight).  HENT: Negative for trouble swallowing and voice change.   Respiratory: Positive for cough. Negative for shortness of breath and wheezing.   Cardiovascular: Positive for leg swelling. Negative for chest pain.  Genitourinary: Negative for decreased urine volume and dysuria.  Musculoskeletal: Negative for arthralgias and myalgias.  Neurological: Negative for syncope and light-headedness.  Hematological: Negative for adenopathy. Does not bruise/bleed easily.  All other systems reviewed and are negative.   BP 107/64   Pulse 100   Resp 20   Ht 6\' 3"  (1.905 m)   Wt 168 lb (76.2 kg)   SpO2 98% Comment: RA  BMI 21.00 kg/m  Physical Exam  Constitutional: He is oriented to person, place, and time. He appears well-developed and well-nourished. No distress.  HENT:  Head: Normocephalic and atraumatic.  Mouth/Throat: No oropharyngeal exudate.  Eyes: Conjunctivae and EOM are normal. No scleral icterus.  Neck: Neck supple. JVD present. No thyromegaly present.  Cardiovascular: Normal rate, regular rhythm and normal heart sounds. Exam reveals no friction rub.  No murmur heard. Pulmonary/Chest: Effort normal. No respiratory distress. He has wheezes (Coarse bilateral).  Abdominal: Soft. Bowel sounds are normal. He exhibits no distension. There is no tenderness.  Musculoskeletal: He exhibits edema (1+ on left/ trace on right). He exhibits no deformity.  Neurological: He is alert and oriented to person, place, and time. No cranial nerve deficit. He exhibits normal muscle tone. Coordination normal.  Skin: Skin is warm and dry.  Vitals  reviewed.    Diagnostic Tests: Echocardiogram 11/18/2017 ------------------------------------------------------------------- Study Conclusions  - Left ventricle: The cavity size was normal. Systolic function was   normal. The estimated ejection fraction was in the range of 55%   to 60%. Wall motion was normal; there were no regional wall   motion abnormalities. - Aortic valve: Valve area (VTI): 2.69 cm^2. Valve area (Vmax):   2.79 cm^2. Valve area (Vmean): 2.55 cm^2. - Left atrium: The atrium was mildly dilated. - Pericardium, extracardiac: A moderate to large, free-flowing   pericardial effusion was identified circumferential to the heart.   The fluid had no internal echoes.There was no evidence of   hemodynamic compromise.  Impressions:  - Normal LVEF.  Moderate to large pericardial effuison.   Grossly: no tamponade. CT chest 11/16/2017 IMPRESSION: 1. Progressive malignancy in the chest with enlarging mediastinal masses causing vascular compression of the brachiocephalic vein and SVC; and new and enlarging pulmonary nodules. Of note, the previous moderate to large pericardial effusion is now large in volume, malignant pericardial effusion cannot be readily excluded. 2. New widespread hepatic metastatic disease. 3. Borderline periaortic and left common iliac adenopathy in the abdomen. 4. Enlarging sclerotic lesion in the L5 vertebral body favoring metastatic lesion. The sclerotic lesion in the right iliac bone adjacent to the SI joint is stable. 5. 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   Electronically Signed   By: Van Clines M.D.   On: 11/16/2017 09:06 I personally reviewed the CT chest and concur with the findings noted above.  The echocardiogram images were not available in the system.  Impression: Edward Spence is a 62 year old man with stage IV lung cancer who has a slowly  enlarging pericardial effusion, but no tamponade.  He is referred for consideration for pericardial window.  He denies any change in his respiratory status.  He does have some orthopnea but that is a long-standing problem.  I am surprised that he does not have more dyspnea given the findings on the CT scan.  He does have some swelling in his legs and that may be due to the fusion.  I am concerned that its not more symmetrical and I think we need to rule out DVT in the left leg as he is high risk for clot.  Based on his lack of dramatic symptoms, I am not sure a pericardial window will provide much of an improvement in his clinical condition.  Will provide information as to the cause of the pericardial effusion, which I suspect is malignant.  I described the proposed operation to him.  He understands this will be done under general anesthesia, the incision to be used, the use of a drainage tube postoperatively, the expected hospital stay, and the overall recovery.  I informed Mr. and Mrs. Broden of the indications, risks, benefits, and alternatives.  They understand the risks include, but are not limited to death, MI, DVT, PE, stroke, bleeding, possible need for transfusion, infection, recurrent effusion, as well as possibility of other unforeseeable complications.  They do understand that pericardial window does not include the possibility of a recurrent pericardial effusion.  Plan: Duplex today to rule out left leg DVT.  Subxiphoid pericardial window on Wednesday, 12/03/1998  [name], MD [facility] [phone]   Duplex negative for DVT.  Revonda Standard Roxan Hockey, MD [facility] [phone]

## 2017-11-24 NOTE — Progress Notes (Addendum)
PCP is House, [name], FNP Referring Provider is House, [name], FNP  Chief Complaint  Patient presents with  . Pericardial Effusion    CT C/A/P 11/16/17, ECHO 11/18/17    HPI: Edward Spence is sent for consultation regarding a pericardial effusion.  Edward Spence is a 62 year old man with history of tobacco abuse who has stage IV lung cancer.  Recent CT of his chest showed a large pericardial effusion mostly along the diaphragmatic area.  He reportedly also complained of shortness of breath.  He had an echocardiogram and Dr. Wendy Poet office last week which showed a moderate to large pericardial effusion with no tamponade.  This was a free-flowing effusion.  He had normal left ventricular function.  Unfortunately his CT of the chest also showed progressive disease now with hepatic metastases.  He says he does not feel short of breath.  He does have a little orthopnea but that is not a new finding.  He has noted some swelling in his left foot and ankle today.  That is new.  He denies pain in the calf.  He says overall he feels better and has been gaining some weight.  Zubrod Score: At the time of surgery this patient's most appropriate activity status/level should be described as: []     0    Normal activity, no symptoms [x]     1    Restricted in physical strenuous activity but ambulatory, able to do out light work []     2    Ambulatory and capable of self care, unable to do work activities, up and about >50 % of waking hours                              []     3    Only limited self care, in bed greater than 50% of waking hours []     4    Completely disabled, no self care, confined to bed or chair []     5    Moribund  Past Medical History:  Diagnosis Date  . Cavitary pneumonia 01/21/2017  . Constipation 07/23/2016  . Encounter for antineoplastic chemotherapy 06/24/2016  . Lung cancer ([place])   . Lung mass 05/19/2016  . Pneumonia    in college    Past Surgical History:  Procedure  Laterality Date  . APPENDECTOMY    . VIDEO BRONCHOSCOPY Bilateral 02/27/2017   Procedure: VIDEO BRONCHOSCOPY WITH FLUORO;  Surgeon: Marshell Garfinkel, MD;  Location: [facility];  Service: Cardiopulmonary;  Laterality: Bilateral;  . VIDEO BRONCHOSCOPY WITH ENDOBRONCHIAL ULTRASOUND N/A 05/22/2016   Procedure: VIDEO BRONCHOSCOPY WITH ENDOBRONCHIAL ULTRASOUND;  Surgeon: [name], MD;  Location: [facility] OR;  Service: Thoracic;  Laterality: N/A;    Family History  Problem Relation Age of Onset  . Cancer Mother   . COPD Father   . Heart failure Father     Social History Social History   Tobacco Use  . Smoking status: Current Every Day Smoker    Packs/day: 1.00    Years: 40.00    Pack years: 40.00    Types: Cigarettes    Last attempt to quit: 04/29/2016    Years since quitting: 1.5  . Smokeless tobacco: Never Used  . Tobacco comment: 1-5 per day  Substance Use Topics  . Alcohol use: No  . Drug use: No    Current Outpatient Medications  Medication Sig Dispense Refill  . albuterol (PROVENTIL HFA;VENTOLIN HFA) 108 (  90 Base) MCG/ACT inhaler Inhale 2 puffs into the lungs every 6 (six) hours as needed for wheezing or shortness of breath. 1 Inhaler 2  . crizotinib (XALKORI) 250 MG capsule Take 1 capsule (250 mg total) by mouth 2 (two) times daily. 60 capsule 2  . morphine (MS CONTIN) 60 MG 12 hr tablet Take 1 tablet (60 mg total) by mouth 3 (three) times daily. 90 tablet 0  . oxyCODONE (ROXICODONE) 15 MG immediate release tablet Take 1 tablet (15 mg total) by mouth every 4 (four) hours as needed. 120 tablet 0  . Respiratory Therapy Supplies (FLUTTER) DEVI USE AS DIRECTED 1 each 0   No current facility-administered medications for this visit.     Allergies  Allergen Reactions  . Penicillins Hives    Had penicillin it at age 54 and " I got trench mouth and hives " Has patient had a PCN reaction causing immediate rash, facial/tongue/throat swelling, SOB or lightheadedness with  hypotension: No Has patient had a PCN reaction causing severe rash involving mucus membranes or skin necrosis: Yes Has patient had a PCN reaction that required hospitalization: No Has patient had a PCN reaction occurring within the last 10 years: No     Review of Systems  Constitutional: Positive for unexpected weight change (Has been gaining weight).  HENT: Negative for trouble swallowing and voice change.   Respiratory: Positive for cough. Negative for shortness of breath and wheezing.   Cardiovascular: Positive for leg swelling. Negative for chest pain.  Genitourinary: Negative for decreased urine volume and dysuria.  Musculoskeletal: Negative for arthralgias and myalgias.  Neurological: Negative for syncope and light-headedness.  Hematological: Negative for adenopathy. Does not bruise/bleed easily.  All other systems reviewed and are negative.   BP 107/64   Pulse 100   Resp 20   Ht 6\' 3"  (1.905 m)   Wt 168 lb (76.2 kg)   SpO2 98% Comment: RA  BMI 21.00 kg/m  Physical Exam  Constitutional: He is oriented to person, place, and time. He appears well-developed and well-nourished. No distress.  HENT:  Head: Normocephalic and atraumatic.  Mouth/Throat: No oropharyngeal exudate.  Eyes: Conjunctivae and EOM are normal. No scleral icterus.  Neck: Neck supple. JVD present. No thyromegaly present.  Cardiovascular: Normal rate, regular rhythm and normal heart sounds. Exam reveals no friction rub.  No murmur heard. Pulmonary/Chest: Effort normal. No respiratory distress. He has wheezes (Coarse bilateral).  Abdominal: Soft. Bowel sounds are normal. He exhibits no distension. There is no tenderness.  Musculoskeletal: He exhibits edema (1+ on left/ trace on right). He exhibits no deformity.  Neurological: He is alert and oriented to person, place, and time. No cranial nerve deficit. He exhibits normal muscle tone. Coordination normal.  Skin: Skin is warm and dry.  Vitals  reviewed.    Diagnostic Tests: Echocardiogram 11/18/2017 ------------------------------------------------------------------- Study Conclusions  - Left ventricle: The cavity size was normal. Systolic function was   normal. The estimated ejection fraction was in the range of 55%   to 60%. Wall motion was normal; there were no regional wall   motion abnormalities. - Aortic valve: Valve area (VTI): 2.69 cm^2. Valve area (Vmax):   2.79 cm^2. Valve area (Vmean): 2.55 cm^2. - Left atrium: The atrium was mildly dilated. - Pericardium, extracardiac: A moderate to large, free-flowing   pericardial effusion was identified circumferential to the heart.   The fluid had no internal echoes.There was no evidence of   hemodynamic compromise.  Impressions:  - Normal LVEF.  Moderate to large pericardial effuison.   Grossly: no tamponade. CT chest 11/16/2017 IMPRESSION: 1. Progressive malignancy in the chest with enlarging mediastinal masses causing vascular compression of the brachiocephalic vein and SVC; and new and enlarging pulmonary nodules. Of note, the previous moderate to large pericardial effusion is now large in volume, malignant pericardial effusion cannot be readily excluded. 2. New widespread hepatic metastatic disease. 3. Borderline periaortic and left common iliac adenopathy in the abdomen. 4. Enlarging sclerotic lesion in the L5 vertebral body favoring metastatic lesion. The sclerotic lesion in the right iliac bone adjacent to the SI joint is stable. 5. 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   Electronically Signed   By: Van Clines M.D.   On: 11/16/2017 09:06 I personally reviewed the CT chest and concur with the findings noted above.  The echocardiogram images were not available in the system.  Impression: Edward Spence is a 62 year old man with stage IV lung cancer who has a slowly  enlarging pericardial effusion, but no tamponade.  He is referred for consideration for pericardial window.  He denies any change in his respiratory status.  He does have some orthopnea but that is a long-standing problem.  I am surprised that he does not have more dyspnea given the findings on the CT scan.  He does have some swelling in his legs and that may be due to the fusion.  I am concerned that its not more symmetrical and I think we need to rule out DVT in the left leg as he is high risk for clot.  Based on his lack of dramatic symptoms, I am not sure a pericardial window will provide much of an improvement in his clinical condition.  Will provide information as to the cause of the pericardial effusion, which I suspect is malignant.  I described the proposed operation to him.  He understands this will be done under general anesthesia, the incision to be used, the use of a drainage tube postoperatively, the expected hospital stay, and the overall recovery.  I informed Mr. and Mrs. Leisner of the indications, risks, benefits, and alternatives.  They understand the risks include, but are not limited to death, MI, DVT, PE, stroke, bleeding, possible need for transfusion, infection, recurrent effusion, as well as possibility of other unforeseeable complications.  They do understand that pericardial window does not include the possibility of a recurrent pericardial effusion.  Plan: Duplex today to rule out left leg DVT.  Subxiphoid pericardial window on Wednesday, 12/03/1998  [name], MD [facility] [phone]   Duplex negative for DVT.  Revonda Standard Roxan Hockey, MD [facility] [phone]

## 2017-11-24 NOTE — Progress Notes (Signed)
LLE venous duplex prelim: negative for DVT. Landry Mellow, RDMS, RVT  Called results to [facility].

## 2017-11-25 ENCOUNTER — Other Ambulatory Visit: Payer: Self-pay | Admitting: *Deleted

## 2017-11-25 DIAGNOSIS — I3139 Other pericardial effusion (noninflammatory): Secondary | ICD-10-CM

## 2017-11-25 DIAGNOSIS — I313 Pericardial effusion (noninflammatory): Secondary | ICD-10-CM

## 2017-11-30 NOTE — Pre-Procedure Instructions (Signed)
MINAS BONSER  11/30/2017      [place], [address] [place] [address] Phone: [phone] Fax: [phone]  [place], [place] - [address] [place] [address] Phone: [phone] Fax: [phone]  [id number] - [place], [place] [address] [place] [address] Phone: [phone] Fax: [phone]    Your procedure is scheduled on march 6  Report to [place] at [place].M.  Call this number if you have problems the morning of surgery:  [phone]   Remember:  Do not eat food or drink liquids after midnight.  Continue all medications as directed by your physician except follow these medication instructions before surgery below   Take these medicines the morning of surgery with A SIP OF WATER  albuterol (PROVENTIL HFA;VENTOLIN HFA)  crizotinib Hulda Humphrey)  oxyCODONE (ROXICODONE  7 days prior to surgery STOP taking any Aspirin(unless otherwise instructed by your surgeon), Aleve, Naproxen, Ibuprofen, Motrin, Advil, Goody's, BC's, all herbal medications, fish oil, and all vitamins    Do not wear jewelry  Do not wear lotions, powders, or cologne, or deodorant.  Men may shave face and neck.  Do not bring valuables to the hospital.  [facility] is not responsible for any belongings or valuables.  Contacts, dentures or bridgework may not be worn into surgery.  Leave your suitcase in the car.  After surgery it may be brought to your room.  For patients admitted to the hospital, discharge time will be determined by your treatment team.  Patients discharged the day of surgery will not be allowed to drive home.    Special instructions:   [facility]- Preparing For Surgery  Before surgery, you can play an important role. Because skin is not  sterile, your skin needs to be as free of germs as possible. You can reduce the number of germs on your skin by washing with CHG (chlorahexidine gluconate) Soap before surgery.  CHG is an antiseptic cleaner which kills germs and bonds with the skin to continue killing germs even after washing.  Please do not use if you have an allergy to CHG or antibacterial soaps. If your skin becomes reddened/irritated stop using the CHG.  Do not shave (including legs and underarms) for at least 48 hours prior to first CHG shower. It is OK to shave your face.  Please follow these instructions carefully.   1. Shower the NIGHT BEFORE SURGERY and the MORNING OF SURGERY with CHG.   2. If you chose to wash your hair, wash your hair first as usual with your normal shampoo.  3. After you shampoo, rinse your hair and body thoroughly to remove the shampoo.  4. Use CHG as you would any other liquid soap. You can apply CHG directly to the skin and wash gently with a scrungie or a clean washcloth.   5. Apply the CHG Soap to your body ONLY FROM THE NECK DOWN.  Do not use on open wounds or open sores. Avoid contact with your eyes, ears, mouth and genitals (private parts). Wash Face and genitals (private parts)  with your normal soap.  6. Wash thoroughly, paying special attention to the area where your surgery will be performed.  7. Thoroughly rinse your body with warm water from the neck down.  8. DO  NOT shower/wash with your normal soap after using and rinsing off the CHG Soap.  9. Pat yourself dry with a CLEAN TOWEL.  10. Wear CLEAN PAJAMAS to bed the night before surgery, wear comfortable clothes the morning of surgery  11. Place CLEAN SHEETS on your bed the night of your first shower and DO NOT SLEEP WITH PETS.    Day of Surgery: Do not apply any deodorants/lotions. Please wear clean clothes to the hospital/surgery center.      Please read over the following fact sheets that you were given.

## 2017-12-01 ENCOUNTER — Encounter (HOSPITAL_COMMUNITY): Payer: Self-pay

## 2017-12-01 ENCOUNTER — Ambulatory Visit: Payer: Medicaid Other | Admitting: Oncology

## 2017-12-01 ENCOUNTER — Other Ambulatory Visit: Payer: Self-pay | Admitting: *Deleted

## 2017-12-01 ENCOUNTER — Other Ambulatory Visit: Payer: Self-pay

## 2017-12-01 ENCOUNTER — Encounter (HOSPITAL_COMMUNITY)
Admission: RE | Admit: 2017-12-01 | Discharge: 2017-12-01 | Disposition: A | Discharge status: Home / Self Care | Payer: Medicaid Other | Source: Ambulatory Visit | Attending: Thoracic Surgery (Cardiothoracic Vascular Surgery) | Admitting: Thoracic Surgery (Cardiothoracic Vascular Surgery)

## 2017-12-01 ENCOUNTER — Other Ambulatory Visit: Payer: Medicaid Other

## 2017-12-01 DIAGNOSIS — I3139 Other pericardial effusion (noninflammatory): Secondary | ICD-10-CM

## 2017-12-01 DIAGNOSIS — Z01818 Encounter for other preprocedural examination: Secondary | ICD-10-CM | POA: Insufficient documentation

## 2017-12-01 DIAGNOSIS — I313 Pericardial effusion (noninflammatory): Secondary | ICD-10-CM | POA: Insufficient documentation

## 2017-12-01 DIAGNOSIS — Z01812 Encounter for preprocedural laboratory examination: Secondary | ICD-10-CM

## 2017-12-01 HISTORY — DX: Anemia, unspecified: D64.9

## 2017-12-01 LAB — BLOOD GAS, ARTERIAL
Acid-Base Excess: 0.6 mmol/L (ref 0.0–2.0)
BICARBONATE: 23.9 mmol/L (ref 20.0–28.0)
DRAWN BY: 470591
FIO2: 21
O2 SAT: 96.4 %
PCO2 ART: 33.2 mmHg (ref 32.0–48.0)
PH ART: 7.471 — AB (ref 7.350–7.450)
PO2 ART: 79.8 mmHg — AB (ref 83.0–108.0)
Patient temperature: 98.6

## 2017-12-01 LAB — SURGICAL PCR SCREEN
MRSA, PCR: NEGATIVE
Staphylococcus aureus: NEGATIVE

## 2017-12-01 LAB — APTT: aPTT: 29 s (ref 22–34)

## 2017-12-01 LAB — CBC
HCT: 37.7 % — ABNORMAL LOW (ref 39.0–52.0)
Hemoglobin: 11.6 g/dL — ABNORMAL LOW (ref 13.0–17.0)
MCH: 26.5 pg (ref 26.0–34.0)
MCHC: 30.8 g/dL (ref 30.0–36.0)
MCV: 86.3 fL (ref 78.0–100.0)
Platelets: 184 10*3/uL (ref 150–400)
RBC: 4.37 MIL/uL (ref 4.22–5.81)
RDW: 18.8 % — AB (ref 11.5–15.5)
WBC: 5.5 10*3/uL (ref 4.0–10.5)

## 2017-12-01 LAB — COMPREHENSIVE METABOLIC PANEL
ALT: 11 U/L — AB (ref 17–63)
ANION GAP: 7 (ref 5–15)
AST: 33 U/L (ref 15–41)
Albumin: 3.3 g/dL — ABNORMAL LOW (ref 3.5–5.0)
Alkaline Phosphatase: 82 U/L (ref 38–126)
BUN: 8 mg/dL (ref 6–20)
CHLORIDE: 106 mmol/L (ref 101–111)
CO2: 24 mmol/L (ref 22–32)
CREATININE: 0.63 mg/dL (ref 0.61–1.24)
Calcium: 8.7 mg/dL — ABNORMAL LOW (ref 8.9–10.3)
Glucose, Bld: 89 mg/dL (ref 65–99)
POTASSIUM: 4.4 mmol/L (ref 3.5–5.1)
SODIUM: 137 mmol/L (ref 135–145)
Total Bilirubin: 0.4 mg/dL (ref 0.3–1.2)
Total Protein: 6.6 g/dL (ref 6.5–8.1)

## 2017-12-01 LAB — TYPE AND SCREEN
ABO/RH(D): A POS
Antibody Screen: NEGATIVE

## 2017-12-01 LAB — URINALYSIS, ROUTINE W REFLEX MICROSCOPIC
Bilirubin Urine: NEGATIVE
GLUCOSE, UA: NEGATIVE mg/dL
HGB URINE DIPSTICK: NEGATIVE
KETONES UR: NEGATIVE mg/dL
Leukocytes, UA: NEGATIVE
Nitrite: NEGATIVE
PH: 6 (ref 5.0–8.0)
Protein, ur: NEGATIVE mg/dL
Specific Gravity, Urine: 1.013 (ref 1.005–1.030)

## 2017-12-01 LAB — PROTIME-INR
INR: 1.3
INR: 1.1
PROTHROMBIN TIME: 16 s — AB (ref 11.4–15.2)
PROTHROMBIN TIME: 11.9 s — AB (ref 9.0–11.5)

## 2017-12-01 NOTE — Progress Notes (Signed)
Anesthesia Chart Review:  Pt is a 62 year old male scheduled for subxyphoid pericardial window, TEE on 12/02/2017 with Modesto Charon, MD  - PCP is Bradd Burner, NP - Saw cardiologist Jenne Campus, MD 11/18/17 for pericardial effusion - Oncologist is Francie Massing, MD  PMH includes:  Metastatic lung cancer, anemia. Former smoker (quit 04/29/16).   Medications include: albuterol, xalkori  BP (!) 108/56   Pulse 100   Temp 37.1 C   Resp 20   Ht 6\' 3"  (1.905 m)   Wt 170 lb 1.6 oz (77.2 kg)   SpO2 100%   BMI 21.26 kg/m   Preoperative labs reviewed.   - PT 16.0 - PTT >200 - Pre-admission testing RN notified Ryan in Dr. Leonarda Salon office of abnormal PTT. Per Thurmond Butts, PTT is being repeated today at outside lab.  If still high (not an error) surgery will be cancelled.  If PTT normal, plan is to proceed with surgery.   CXR 12/01/17:  - Fairly stable changes in the right upper hemithorax consistent with known malignancy and treatment for same. There has not been significant interval change since the CT scan of approximately 2 and half weeks ago. The cardiac silhouette is not enlarged. There is a known pericardial effusion from the CT scan.  EKG 11/18/17: sinus tachycardia (112 bpm).  Nonspecific ST and T wave abnormality.   Echo 11/18/17:  - Left ventricle: The cavity size was normal. Systolic function was normal. The estimated ejection fraction was in the range of 55% to 60%. Wall motion was normal; there were no regional wall motion abnormalities. - Aortic valve: Valve area (VTI): 2.69 cm^2. Valve area (Vmax): 2.79 cm^2. Valve area (Vmean): 2.55 cm^2. - Left atrium: The atrium was mildly dilated. - Pericardium, extracardiac: A moderate to large, free-flowing pericardial effusion was identified circumferential to the heart. The fluid had no internal echoes.There was no evidence of hemodynamic compromise. - Impressions:  Normal LVEF. Moderate to large pericardial effuison. Grossly: no  tamponade.  CT chest, abdomen, pelvis 11/16/17:  1. Progressive malignancy in the chest with enlarging mediastinal masses causing vascular compression of the brachiocephalic vein and SVC; and new and enlarging pulmonary nodules. Of note, the previous moderate to large pericardial effusion is now large in volume, malignant pericardial effusion cannot be readily excluded. 2. New widespread hepatic metastatic disease. 3. Borderline periaortic and left common iliac adenopathy in the abdomen. 4. Enlarging sclerotic lesion in the L5 vertebral body favoring metastatic lesion. The sclerotic lesion in the right iliac bone adjacent to the SI joint is stable. 5. Stable appearance of cavitary airspace opacity in the right upper  lobe likely partially from radiation pneumonitis.   If repeat PTT at outside lab normal, I anticipate pt can proceed as scheduled.   Willeen Cass, FNP-BC [facility]/Anesthesiology Phone: [phone] 12/01/2017 4:39 PM

## 2017-12-01 NOTE — Anesthesia Preprocedure Evaluation (Addendum)
Anesthesia Evaluation  Patient identified by MRN, date of birth, ID band Patient awake    Reviewed: Allergy & Precautions, NPO status , Patient's Chart, lab work & pertinent test results  History of Anesthesia Complications Negative for: history of anesthetic complications  Airway Mallampati: I  TM Distance: >3 FB Neck ROM: Full    Dental  (+) Edentulous Upper, Edentulous Lower   Pulmonary COPD,  COPD inhaler, former smoker (quit 2017),  R lung cancer: chemo   breath sounds clear to auscultation       Cardiovascular (-) hypertension(-) angina Rhythm:Regular Rate:Normal  2/19 ECHO: EF 55-60%, valves OK, large pericardial effusion without evidence of tamponade   Neuro/Psych negative neurological ROS     GI/Hepatic negative GI ROS, Neg liver ROS,   Endo/Other  negative endocrine ROS  Renal/GU negative Renal ROS     Musculoskeletal   Abdominal   Peds  Hematology negative hematology ROS (+)   Anesthesia Other Findings   Reproductive/Obstetrics                            Anesthesia Physical Anesthesia Plan  ASA: III  Anesthesia Plan: General   Post-op Pain Management:    Induction: Intravenous  PONV Risk Score and Plan: 3 and Ondansetron and Dexamethasone  Airway Management Planned: Oral ETT  Additional Equipment: Arterial line and CVP  Intra-op Plan:   Post-operative Plan: Extubation in OR  Informed Consent: I have reviewed the patients History and Physical, chart, labs and discussed the procedure including the risks, benefits and alternatives for the proposed anesthesia with the patient or authorized representative who has indicated his/her understanding and acceptance.     Plan Discussed with: CRNA and Surgeon  Anesthesia Plan Comments: (Plan routine monitors, GETA)        Anesthesia Quick Evaluation

## 2017-12-01 NOTE — Progress Notes (Addendum)
No PCP Oncologist is Dr. Earlie Server   Dx with small cell Lung CA in 04/2016 Denies murmur, cp, sob. Did see Dr. Agustin Cree  11/18/2017 Spoke with lab.  They relayed that [name]' PTT is over 200.  I did call and speak with Thurmond Butts at the office.  She will check with Dr Roxan Hockey.

## 2017-12-02 ENCOUNTER — Encounter (HOSPITAL_COMMUNITY)
Admission: RE | Disposition: A | Discharge status: Home / Self Care | Payer: Self-pay | Source: Ambulatory Visit | Attending: Thoracic Surgery (Cardiothoracic Vascular Surgery)

## 2017-12-02 ENCOUNTER — Inpatient Hospital Stay (HOSPITAL_COMMUNITY): Payer: Medicaid Other | Admitting: Certified Registered Nurse Anesthetist

## 2017-12-02 ENCOUNTER — Encounter (HOSPITAL_COMMUNITY): Payer: Self-pay | Admitting: Urology

## 2017-12-02 ENCOUNTER — Inpatient Hospital Stay (HOSPITAL_COMMUNITY)
Admission: RE | Admit: 2017-12-02 | Discharge: 2017-12-07 | DRG: 271 | Disposition: A | Discharge status: Home / Self Care | Payer: Medicaid Other | Source: Ambulatory Visit | Attending: Thoracic Surgery (Cardiothoracic Vascular Surgery) | Admitting: Thoracic Surgery (Cardiothoracic Vascular Surgery)

## 2017-12-02 ENCOUNTER — Other Ambulatory Visit: Payer: Self-pay

## 2017-12-02 ENCOUNTER — Inpatient Hospital Stay (HOSPITAL_COMMUNITY): Payer: Medicaid Other

## 2017-12-02 DIAGNOSIS — C787 Secondary malignant neoplasm of liver and intrahepatic bile duct: Secondary | ICD-10-CM | POA: Diagnosis present

## 2017-12-02 DIAGNOSIS — R402413 Glasgow coma scale score 13-15, at hospital admission: Secondary | ICD-10-CM | POA: Diagnosis present

## 2017-12-02 DIAGNOSIS — C3490 Malignant neoplasm of unspecified part of unspecified bronchus or lung: Secondary | ICD-10-CM | POA: Diagnosis present

## 2017-12-02 DIAGNOSIS — G8929 Other chronic pain: Secondary | ICD-10-CM | POA: Diagnosis present

## 2017-12-02 DIAGNOSIS — Z880 Allergy status to penicillin: Secondary | ICD-10-CM

## 2017-12-02 DIAGNOSIS — Z79899 Other long term (current) drug therapy: Secondary | ICD-10-CM | POA: Diagnosis not present

## 2017-12-02 DIAGNOSIS — Z4682 Encounter for fitting and adjustment of non-vascular catheter: Secondary | ICD-10-CM

## 2017-12-02 DIAGNOSIS — F17210 Nicotine dependence, cigarettes, uncomplicated: Secondary | ICD-10-CM | POA: Diagnosis present

## 2017-12-02 DIAGNOSIS — Z9889 Other specified postprocedural states: Secondary | ICD-10-CM

## 2017-12-02 DIAGNOSIS — Z9221 Personal history of antineoplastic chemotherapy: Secondary | ICD-10-CM

## 2017-12-02 DIAGNOSIS — I313 Pericardial effusion (noninflammatory): Secondary | ICD-10-CM | POA: Diagnosis present

## 2017-12-02 DIAGNOSIS — I3139 Other pericardial effusion (noninflammatory): Secondary | ICD-10-CM

## 2017-12-02 DIAGNOSIS — Z79891 Long term (current) use of opiate analgesic: Secondary | ICD-10-CM | POA: Diagnosis not present

## 2017-12-02 HISTORY — PX: SUBXYPHOID PERICARDIAL WINDOW: SHX5075

## 2017-12-02 HISTORY — PX: TEE WITHOUT CARDIOVERSION: SHX5443

## 2017-12-02 LAB — GLUCOSE, CAPILLARY
GLUCOSE-CAPILLARY: 108 mg/dL — AB (ref 65–99)
GLUCOSE-CAPILLARY: 187 mg/dL — AB (ref 65–99)
GLUCOSE-CAPILLARY: 182 mg/dL — AB (ref 65–99)

## 2017-12-02 LAB — BODY FLUID CELL COUNT WITH DIFFERENTIAL
Lymphs, Fluid: 13 %
MONOCYTE-MACROPHAGE-SEROUS FLUID: 85 % (ref 50–90)
NEUTROPHIL FLUID: 2 % (ref 0–25)
Total Nucleated Cell Count, Fluid: 98 cu mm (ref 0–1000)

## 2017-12-02 SURGERY — CREATION, PERICARDIAL WINDOW, SUBXIPHOID APPROACH
Anesthesia: General | Site: Chest

## 2017-12-02 MED ORDER — LACTATED RINGERS IV SOLN
INTRAVENOUS | Status: DC | PRN
  Administered 2017-12-02: 08:00:00 via INTRAVENOUS

## 2017-12-02 MED ORDER — OXYCODONE HCL 5 MG PO TABS
5.0000 mg | ORAL_TABLET | ORAL | Status: DC | PRN
  Administered 2017-12-02: 10 mg via ORAL
  Filled 2017-12-02: qty 2

## 2017-12-02 MED ORDER — CRIZOTINIB 250 MG PO CAPS: 250.0000 mg | ORAL_CAPSULE | Freq: Two times a day (BID) | ORAL | Status: DC

## 2017-12-02 MED ORDER — 0.9 % SODIUM CHLORIDE (POUR BTL) OPTIME
TOPICAL | Status: DC | PRN
  Administered 2017-12-02: 1000 mL

## 2017-12-02 MED ORDER — FENTANYL CITRATE (PF) 250 MCG/5ML IJ SOLN
INTRAMUSCULAR | Status: DC | PRN
  Administered 2017-12-02 (×5): 50 ug via INTRAVENOUS

## 2017-12-02 MED ORDER — ORAL CARE MOUTH RINSE
15.0000 mL | Freq: Two times a day (BID) | OROMUCOSAL | Status: DC
  Administered 2017-12-02 – 2017-12-06 (×4): 15 mL via OROMUCOSAL

## 2017-12-02 MED ORDER — FENTANYL 40 MCG/ML IV SOLN
INTRAVENOUS | Status: DC
  Administered 2017-12-02: 1000 ug via INTRAVENOUS
  Administered 2017-12-02: 171.9 ug via INTRAVENOUS
  Administered 2017-12-02: 255 ug via INTRAVENOUS
  Administered 2017-12-03: 60 ug via INTRAVENOUS
  Administered 2017-12-03: 15 ug via INTRAVENOUS
  Filled 2017-12-02: qty 25

## 2017-12-02 MED ORDER — SENNOSIDES-DOCUSATE SODIUM 8.6-50 MG PO TABS
1.0000 | ORAL_TABLET | Freq: Every day | ORAL | Status: DC
  Administered 2017-12-02 – 2017-12-06 (×5): 1 via ORAL
  Filled 2017-12-02 (×5): qty 1

## 2017-12-02 MED ORDER — ACETAMINOPHEN 500 MG PO TABS
1000.0000 mg | ORAL_TABLET | Freq: Four times a day (QID) | ORAL | Status: AC
  Administered 2017-12-02 – 2017-12-07 (×18): 1000 mg via ORAL
  Filled 2017-12-02 (×18): qty 2

## 2017-12-02 MED ORDER — ROCURONIUM BROMIDE 10 MG/ML (PF) SYRINGE
PREFILLED_SYRINGE | INTRAVENOUS | Status: DC | PRN
  Administered 2017-12-02: 50 mg via INTRAVENOUS

## 2017-12-02 MED ORDER — DIPHENHYDRAMINE HCL 50 MG/ML IJ SOLN: 12.5000 mg | Freq: Four times a day (QID) | INTRAMUSCULAR | Status: DC | PRN

## 2017-12-02 MED ORDER — ONDANSETRON HCL 4 MG/2ML IJ SOLN
INTRAMUSCULAR | Status: DC | PRN
  Administered 2017-12-02: 4 mg via INTRAVENOUS

## 2017-12-02 MED ORDER — MIDAZOLAM HCL 2 MG/2ML IJ SOLN
INTRAMUSCULAR | Status: AC
  Filled 2017-12-02: qty 2

## 2017-12-02 MED ORDER — ONDANSETRON HCL 4 MG/2ML IJ SOLN: 4.0000 mg | Freq: Four times a day (QID) | INTRAMUSCULAR | Status: DC | PRN

## 2017-12-02 MED ORDER — FENTANYL CITRATE (PF) 100 MCG/2ML IJ SOLN: 25.0000 ug | INTRAMUSCULAR | Status: DC | PRN

## 2017-12-02 MED ORDER — DEXTROSE 5 % IV SOLN
INTRAVENOUS | Status: DC | PRN
  Administered 2017-12-02: 25 ug/min via INTRAVENOUS

## 2017-12-02 MED ORDER — VANCOMYCIN HCL IN DEXTROSE 1-5 GM/200ML-% IV SOLN
1000.0000 mg | INTRAVENOUS | Status: AC
  Administered 2017-12-02: 1000 mg via INTRAVENOUS

## 2017-12-02 MED ORDER — OXYCODONE HCL 5 MG PO TABS
ORAL_TABLET | ORAL | Status: AC
  Administered 2017-12-02: 10 mg via ORAL
  Filled 2017-12-02: qty 2

## 2017-12-02 MED ORDER — PROPOFOL 10 MG/ML IV BOLUS
INTRAVENOUS | Status: AC
  Filled 2017-12-02: qty 20

## 2017-12-02 MED ORDER — ORAL CARE MOUTH RINSE
15.0000 mL | Freq: Two times a day (BID) | OROMUCOSAL | Status: DC
  Administered 2017-12-02 – 2017-12-05 (×2): 15 mL via OROMUCOSAL

## 2017-12-02 MED ORDER — HYDROMORPHONE HCL 1 MG/ML IJ SOLN
0.2500 mg | INTRAMUSCULAR | Status: DC | PRN
  Administered 2017-12-02 (×4): 0.5 mg via INTRAVENOUS

## 2017-12-02 MED ORDER — ENOXAPARIN SODIUM 40 MG/0.4ML ~~LOC~~ SOLN
40.0000 mg | Freq: Every day | SUBCUTANEOUS | Status: DC
  Administered 2017-12-02 – 2017-12-06 (×5): 40 mg via SUBCUTANEOUS
  Filled 2017-12-02 (×5): qty 0.4

## 2017-12-02 MED ORDER — BISACODYL 5 MG PO TBEC
10.0000 mg | DELAYED_RELEASE_TABLET | Freq: Every day | ORAL | Status: DC
  Administered 2017-12-03 – 2017-12-07 (×3): 10 mg via ORAL
  Filled 2017-12-02 (×4): qty 2

## 2017-12-02 MED ORDER — PROPOFOL 10 MG/ML IV BOLUS
INTRAVENOUS | Status: DC | PRN
  Administered 2017-12-02: 120 mg via INTRAVENOUS

## 2017-12-02 MED ORDER — ACETAMINOPHEN 160 MG/5ML PO SOLN
1000.0000 mg | Freq: Four times a day (QID) | ORAL | Status: AC
  Administered 2017-12-02: 1000 mg via ORAL
  Filled 2017-12-02: qty 40.6

## 2017-12-02 MED ORDER — VANCOMYCIN HCL IN DEXTROSE 1-5 GM/200ML-% IV SOLN
1000.0000 mg | Freq: Two times a day (BID) | INTRAVENOUS | Status: AC
  Administered 2017-12-02: 1000 mg via INTRAVENOUS
  Filled 2017-12-02: qty 200

## 2017-12-02 MED ORDER — DEXTROSE-NACL 5-0.45 % IV SOLN
INTRAVENOUS | Status: DC
  Administered 2017-12-02 (×2): via INTRAVENOUS

## 2017-12-02 MED ORDER — POTASSIUM CHLORIDE 10 MEQ/50ML IV SOLN
10.0000 meq | Freq: Every day | INTRAVENOUS | Status: DC | PRN
  Administered 2017-12-03 (×3): 10 meq via INTRAVENOUS
  Filled 2017-12-02 (×4): qty 50

## 2017-12-02 MED ORDER — HYDROMORPHONE HCL 1 MG/ML IJ SOLN
INTRAMUSCULAR | Status: AC
  Administered 2017-12-02: 0.5 mg via INTRAVENOUS
  Filled 2017-12-02: qty 1

## 2017-12-02 MED ORDER — PNEUMOCOCCAL VAC POLYVALENT 25 MCG/0.5ML IJ INJ: 0.5000 mL | INJECTION | INTRAMUSCULAR | Status: DC | PRN

## 2017-12-02 MED ORDER — CHLORHEXIDINE GLUCONATE CLOTH 2 % EX PADS
6.0000 | MEDICATED_PAD | Freq: Every day | CUTANEOUS | Status: DC
  Administered 2017-12-02: 6 via TOPICAL

## 2017-12-02 MED ORDER — CRIZOTINIB 250 MG PO CAPS
250.0000 mg | ORAL_CAPSULE | Freq: Two times a day (BID) | ORAL | Status: DC
  Administered 2017-12-02 – 2017-12-07 (×10): 250 mg via ORAL
  Filled 2017-12-02 (×10): qty 1

## 2017-12-02 MED ORDER — DIPHENHYDRAMINE HCL 12.5 MG/5ML PO ELIX: 12.5000 mg | ORAL_SOLUTION | Freq: Four times a day (QID) | ORAL | Status: DC | PRN

## 2017-12-02 MED ORDER — MIDAZOLAM HCL 2 MG/2ML IJ SOLN
INTRAMUSCULAR | Status: DC | PRN
  Administered 2017-12-02 (×2): 1 mg via INTRAVENOUS

## 2017-12-02 MED ORDER — ALBUTEROL SULFATE HFA 108 (90 BASE) MCG/ACT IN AERS: 2.0000 | INHALATION_SPRAY | Freq: Four times a day (QID) | RESPIRATORY_TRACT | Status: DC | PRN

## 2017-12-02 MED ORDER — SUGAMMADEX SODIUM 200 MG/2ML IV SOLN
INTRAVENOUS | Status: DC | PRN
  Administered 2017-12-02: 200 mg via INTRAVENOUS

## 2017-12-02 MED ORDER — FENTANYL CITRATE (PF) 250 MCG/5ML IJ SOLN
INTRAMUSCULAR | Status: AC
  Filled 2017-12-02: qty 5

## 2017-12-02 MED ORDER — MEPERIDINE HCL 50 MG/ML IJ SOLN: 6.2500 mg | INTRAMUSCULAR | Status: DC | PRN

## 2017-12-02 MED ORDER — INSULIN ASPART 100 UNIT/ML ~~LOC~~ SOLN
0.0000 [IU] | SUBCUTANEOUS | Status: DC
  Administered 2017-12-02 (×2): 4 [IU] via SUBCUTANEOUS
  Administered 2017-12-03: 2 [IU] via SUBCUTANEOUS
  Administered 2017-12-03: 8 [IU] via SUBCUTANEOUS
  Administered 2017-12-03: 2 [IU] via SUBCUTANEOUS

## 2017-12-02 MED ORDER — TRAMADOL HCL 50 MG PO TABS: 50.0000 mg | ORAL_TABLET | Freq: Four times a day (QID) | ORAL | Status: DC | PRN

## 2017-12-02 MED ORDER — PROMETHAZINE HCL 25 MG/ML IJ SOLN: 6.2500 mg | INTRAMUSCULAR | Status: DC | PRN

## 2017-12-02 MED ORDER — SODIUM CHLORIDE 0.9% FLUSH: 9.0000 mL | INTRAVENOUS | Status: DC | PRN

## 2017-12-02 MED ORDER — MIDAZOLAM HCL 2 MG/2ML IJ SOLN: 0.5000 mg | Freq: Once | INTRAMUSCULAR | Status: DC | PRN

## 2017-12-02 MED ORDER — SODIUM CHLORIDE 0.9% FLUSH
10.0000 mL | Freq: Two times a day (BID) | INTRAVENOUS | Status: DC
  Administered 2017-12-02 – 2017-12-06 (×7): 10 mL

## 2017-12-02 MED ORDER — SODIUM CHLORIDE 0.9% FLUSH: 10.0000 mL | INTRAVENOUS | Status: DC | PRN

## 2017-12-02 MED ORDER — DEXAMETHASONE SODIUM PHOSPHATE 10 MG/ML IJ SOLN
INTRAMUSCULAR | Status: DC | PRN
  Administered 2017-12-02: 10 mg via INTRAVENOUS

## 2017-12-02 MED ORDER — NALOXONE HCL 0.4 MG/ML IJ SOLN: 0.4000 mg | INTRAMUSCULAR | Status: DC | PRN

## 2017-12-02 MED ORDER — VANCOMYCIN HCL IN DEXTROSE 1-5 GM/200ML-% IV SOLN
INTRAVENOUS | Status: AC
  Filled 2017-12-02: qty 200

## 2017-12-02 SURGICAL SUPPLY — 47 items
CANISTER SUCT 3000ML PPV (MISCELLANEOUS) ×3 IMPLANT
CATH MALECOT BARD  28FR (CATHETERS) ×3 IMPLANT
CATH THORACIC 28FR (CATHETERS) IMPLANT
CATH THORACIC 28FR RT ANG (CATHETERS) IMPLANT
CATH THORACIC 36FR (CATHETERS) IMPLANT
CATH THORACIC 36FR RT ANG (CATHETERS) IMPLANT
CLIP VESOCCLUDE MED 24/CT (CLIP) ×3 IMPLANT
CLIP VESOCCLUDE SM WIDE 24/CT (CLIP) ×3 IMPLANT
CONT SPEC 4OZ CLIKSEAL STRL BL (MISCELLANEOUS) ×6 IMPLANT
DERMABOND ADHESIVE PROPEN (GAUZE/BANDAGES/DRESSINGS) ×2
DERMABOND ADVANCED .7 DNX6 (GAUZE/BANDAGES/DRESSINGS) ×1 IMPLANT
DRAIN CHANNEL 28F RND 3/8 FF (WOUND CARE) IMPLANT
DRAIN CHANNEL 32F RND 10.7 FF (WOUND CARE) IMPLANT
DRAPE LAPAROSCOPIC ABDOMINAL (DRAPES) ×3 IMPLANT
DRAPE SLUSH/WARMER DISC (DRAPES) IMPLANT
ELECT REM PT RETURN 9FT ADLT (ELECTROSURGICAL) ×3
ELECTRODE REM PT RTRN 9FT ADLT (ELECTROSURGICAL) ×1 IMPLANT
GAUZE SPONGE 4X4 12PLY STRL (GAUZE/BANDAGES/DRESSINGS) ×6 IMPLANT
GAUZE SPONGE 4X4 12PLY STRL LF (GAUZE/BANDAGES/DRESSINGS) ×3 IMPLANT
GLOVE SURG SIGNA 7.5 PF LTX (GLOVE) ×6 IMPLANT
GOWN STRL REUS W/ TWL LRG LVL3 (GOWN DISPOSABLE) ×1 IMPLANT
GOWN STRL REUS W/ TWL XL LVL3 (GOWN DISPOSABLE) ×1 IMPLANT
GOWN STRL REUS W/TWL LRG LVL3 (GOWN DISPOSABLE) ×2
GOWN STRL REUS W/TWL XL LVL3 (GOWN DISPOSABLE) ×2
KIT BASIN OR (CUSTOM PROCEDURE TRAY) ×3 IMPLANT
KIT ROOM TURNOVER OR (KITS) ×3 IMPLANT
KIT SUCTION CATH 14FR (SUCTIONS) IMPLANT
NS IRRIG 1000ML POUR BTL (IV SOLUTION) ×6 IMPLANT
PACK CHEST (CUSTOM PROCEDURE TRAY) ×3 IMPLANT
PAD ARMBOARD 7.5X6 YLW CONV (MISCELLANEOUS) ×6 IMPLANT
PAD ELECT DEFIB RADIOL ZOLL (MISCELLANEOUS) ×3 IMPLANT
STAPLER VISISTAT 35W (STAPLE) IMPLANT
SUT SILK  1 MH (SUTURE) ×2
SUT SILK 1 MH (SUTURE) ×1 IMPLANT
SUT VIC AB 1 CTX 36 (SUTURE) ×2
SUT VIC AB 1 CTX36XBRD ANBCTR (SUTURE) ×1 IMPLANT
SUT VIC AB 2-0 CTX 36 (SUTURE) ×3 IMPLANT
SUT VIC AB 3-0 X1 27 (SUTURE) ×3 IMPLANT
SWAB COLLECTION DEVICE MRSA (MISCELLANEOUS) IMPLANT
SWAB CULTURE ESWAB REG 1ML (MISCELLANEOUS) IMPLANT
SYSTEM SAHARA CHEST DRAIN ATS (WOUND CARE) ×3 IMPLANT
TAPE CLOTH SURG 4X10 WHT LF (GAUZE/BANDAGES/DRESSINGS) ×3 IMPLANT
TOWEL GREEN STERILE (TOWEL DISPOSABLE) ×3 IMPLANT
TOWEL GREEN STERILE FF (TOWEL DISPOSABLE) ×3 IMPLANT
TRAP SPECIMEN MUCOUS 40CC (MISCELLANEOUS) ×6 IMPLANT
TRAY FOLEY W/METER SILVER 16FR (SET/KITS/TRAYS/PACK) ×3 IMPLANT
WATER STERILE IRR 1000ML POUR (IV SOLUTION) ×6 IMPLANT

## 2017-12-02 NOTE — Anesthesia Procedure Notes (Addendum)
Procedure Name: Intubation Date/Time: 12/02/2017 8:55 AM Performed by: Bryson Corona, CRNA Pre-anesthesia Checklist: Patient identified, Emergency Drugs available, Suction available and Patient being monitored Patient Re-evaluated:Patient Re-evaluated prior to induction Oxygen Delivery Method: Circle System Utilized Preoxygenation: Pre-oxygenation with 100% oxygen Induction Type: IV induction Ventilation: Mask ventilation without difficulty Laryngoscope Size: Mac and 4 Grade View: Grade I Tube type: Oral Tube size: 8.0 mm Number of attempts: 1 Airway Equipment and Method: Stylet Placement Confirmation: ETT inserted through vocal cords under direct vision,  positive ETCO2 and breath sounds checked- equal and bilateral Secured at: 23 cm Tube secured with: Tape Dental Injury: Teeth and Oropharynx as per pre-operative assessment

## 2017-12-02 NOTE — Anesthesia Postprocedure Evaluation (Signed)
Anesthesia Post Note  Patient: Edward Spence  Procedure(s) Performed: SUBXYPHOID PERICARDIAL WINDOW (N/A Chest) TRANSESOPHAGEAL ECHOCARDIOGRAM (TEE) (N/A Chest)     Patient location during evaluation: PACU Anesthesia Type: General Level of consciousness: awake and alert, patient cooperative and oriented Pain control: pain improving. Vital Signs Assessment: post-procedure vital signs reviewed and stable Respiratory status: spontaneous breathing, nonlabored ventilation, respiratory function stable and patient connected to nasal cannula oxygen Cardiovascular status: blood pressure returned to baseline and stable Postop Assessment: no apparent nausea or vomiting Anesthetic complications: no    Last Vitals:  Vitals:   12/02/17 1039 12/02/17 1045  BP:    Pulse: 89 88  Resp: 12   Temp: 36.5 C   SpO2: 100% 99%    Last Pain:  Vitals:   12/02/17 1015  TempSrc:   PainSc: 10-Worst pain ever                 Danyetta Gillham,E. Yamna Mackel

## 2017-12-02 NOTE — Anesthesia Procedure Notes (Signed)
Arterial Line Insertion Start/End3/02/2018 7:45 AM, 12/02/2017 7:50 AM Performed by: Annye Asa, MD, Bryson Corona, CRNA, CRNA  Patient location: Pre-op. Preanesthetic checklist: patient identified, IV checked, site marked, risks and benefits discussed, surgical consent, monitors and equipment checked, pre-op evaluation, timeout performed and anesthesia consent Lidocaine 1% used for infiltration Right, radial was placed Catheter size: 20 G Hand hygiene performed , maximum sterile barriers used  and Seldinger technique used  Attempts: 1 Procedure performed without using ultrasound guided technique. Following insertion, dressing applied and Biopatch. Post procedure assessment: normal  Patient tolerated the procedure well with no immediate complications.

## 2017-12-02 NOTE — Anesthesia Procedure Notes (Addendum)
Central Venous Catheter Insertion Performed by: Nolon Nations, MD, anesthesiologist Start/End3/02/2018 8:00 AM, 12/02/2017 8:08 AM Patient location: Pre-op. Preanesthetic checklist: patient identified, IV checked, site marked, risks and benefits discussed, surgical consent, monitors and equipment checked, pre-op evaluation, timeout performed and anesthesia consent Position: Trendelenburg Lidocaine 1% used for infiltration and patient sedated Hand hygiene performed , maximum sterile barriers used  and Seldinger technique used Catheter size: 8 Fr Total catheter length 16. Central line was placed.Double lumen Procedure performed using ultrasound guided technique. Ultrasound Notes:anatomy identified, needle tip was noted to be adjacent to the nerve/plexus identified, no ultrasound evidence of intravascular and/or intraneural injection and image(s) printed for medical record Attempts: 1 Following insertion, dressing applied, line sutured and Biopatch. Post procedure assessment: blood return through all ports, free fluid flow and no air  Patient tolerated the procedure well with no immediate complications.

## 2017-12-02 NOTE — Brief Op Note (Signed)
12/02/2017  12:25 PM  PATIENT:  Edward Spence  63 y.o. male  PRE-OPERATIVE DIAGNOSIS:  PERICARDIAL EFFUSION  POST-OPERATIVE DIAGNOSIS:  PERICARDIAL EFFUSION  PROCEDURE:  Procedure(s): SUBXYPHOID PERICARDIAL WINDOW (N/A) TRANSESOPHAGEAL ECHOCARDIOGRAM (TEE) (N/A)  SURGEON:  Surgeon(s) and Role:    * [name], MD - Primary  PHYSICIAN ASSISTANT:  Nicholes Rough, PA-C   ANESTHESIA:   general  EBL:  15 mL   BLOOD ADMINISTERED:none  DRAINS: 28 BLAKE DRAIN   LOCAL MEDICATIONS USED:  NONE  SPECIMEN:  Source of Specimen:  PERICARDIUM  DISPOSITION OF SPECIMEN:  PATHOLOGY  COUNTS:  YES  TOURNIQUET:  * No tourniquets in log *  DICTATION: .Dragon Dictation  PLAN OF CARE: Admit to inpatient   PATIENT DISPOSITION:  ICU - extubated and stable.   Delay start of Pharmacological VTE agent (>24hrs) due to surgical blood loss or risk of bleeding: no

## 2017-12-02 NOTE — Transfer of Care (Signed)
Immediate Anesthesia Transfer of Care Note  Patient: Edward Spence  Procedure(s) Performed: SUBXYPHOID PERICARDIAL WINDOW (N/A Chest) TRANSESOPHAGEAL ECHOCARDIOGRAM (TEE) (N/A Chest)  Patient Location: PACU  Anesthesia Type:General  Level of Consciousness: awake, alert  and oriented  Airway & Oxygen Therapy: Patient Spontanous Breathing and Patient connected to nasal cannula oxygen  Post-op Assessment: Report given to RN and Post -op Vital signs reviewed and stable  Post vital signs: Reviewed and stable  Last Vitals:  Vitals:   12/02/17 0638 12/02/17 0953  BP: (!) 103/55 (P) 119/75  Pulse: (!) 102 (P) 92  Resp: 18 (!) (P) 22  Temp:  (P) 36.6 C  SpO2: 99% (P) 100%    Last Pain:  Vitals:   12/02/17 0637  TempSrc: Oral         Complications: No apparent anesthesia complications

## 2017-12-02 NOTE — Progress Notes (Signed)
CT surgery p.m. Rounds  Patient up in chair Complains of generalized chronic pain but incisional pain well controlled with PCA Peripheral edema improved after drainage of pericardial effusion Vital signs stable

## 2017-12-02 NOTE — Interval H&P Note (Signed)
History and Physical Interval Note: Repeat PTT was normal  12/02/2017 8:09 AM  Edward Spence  has presented today for surgery, with the diagnosis of PERICARDIAL EFFUSION  The various methods of treatment have been discussed with the patient and family. After consideration of risks, benefits and other options for treatment, the patient has consented to  Procedure(s): SUBXYPHOID PERICARDIAL WINDOW (N/A) TRANSESOPHAGEAL ECHOCARDIOGRAM (TEE) (N/A) as a surgical intervention .  The patient's history has been reviewed, patient examined, no change in status, stable for surgery.  I have reviewed the patient's chart and labs.  Questions were answered to the patient's satisfaction.     [name]

## 2017-12-03 ENCOUNTER — Inpatient Hospital Stay (HOSPITAL_COMMUNITY): Payer: Medicaid Other

## 2017-12-03 ENCOUNTER — Encounter (HOSPITAL_COMMUNITY): Payer: Self-pay | Admitting: Thoracic Surgery (Cardiothoracic Vascular Surgery)

## 2017-12-03 LAB — GLUCOSE, CAPILLARY
GLUCOSE-CAPILLARY: 127 mg/dL — AB (ref 65–99)
Glucose-Capillary: 105 mg/dL — ABNORMAL HIGH (ref 65–99)
Glucose-Capillary: 120 mg/dL — ABNORMAL HIGH (ref 65–99)
Glucose-Capillary: 122 mg/dL — ABNORMAL HIGH (ref 65–99)
Glucose-Capillary: 137 mg/dL — ABNORMAL HIGH (ref 65–99)
Glucose-Capillary: 209 mg/dL — ABNORMAL HIGH (ref 65–99)

## 2017-12-03 LAB — BLOOD GAS, ARTERIAL
Acid-Base Excess: 2.1 mmol/L — ABNORMAL HIGH (ref 0.0–2.0)
Bicarbonate: 25.5 mmol/L (ref 20.0–28.0)
O2 Content: 2 L/min
O2 SAT: 98.5 %
PATIENT TEMPERATURE: 98.6
PO2 ART: 111 mmHg — AB (ref 83.0–108.0)
pCO2 arterial: 34.9 mmHg (ref 32.0–48.0)
pH, Arterial: 7.476 — ABNORMAL HIGH (ref 7.350–7.450)

## 2017-12-03 LAB — BASIC METABOLIC PANEL
ANION GAP: 8 (ref 5–15)
BUN: 9 mg/dL (ref 6–20)
CALCIUM: 8 mg/dL — AB (ref 8.9–10.3)
CO2: 23 mmol/L (ref 22–32)
Chloride: 107 mmol/L (ref 101–111)
Creatinine, Ser: 0.56 mg/dL — ABNORMAL LOW (ref 0.61–1.24)
GLUCOSE: 119 mg/dL — AB (ref 65–99)
POTASSIUM: 3.7 mmol/L (ref 3.5–5.1)
SODIUM: 138 mmol/L (ref 135–145)

## 2017-12-03 LAB — CBC
HEMATOCRIT: 36.7 % — AB (ref 39.0–52.0)
HEMOGLOBIN: 11.7 g/dL — AB (ref 13.0–17.0)
MCH: 27 pg (ref 26.0–34.0)
MCHC: 31.9 g/dL (ref 30.0–36.0)
MCV: 84.6 fL (ref 78.0–100.0)
Platelets: 215 10*3/uL (ref 150–400)
RBC: 4.34 MIL/uL (ref 4.22–5.81)
RDW: 18.2 % — ABNORMAL HIGH (ref 11.5–15.5)
WBC: 10.9 10*3/uL — AB (ref 4.0–10.5)

## 2017-12-03 LAB — PROTEIN, BODY FLUID (OTHER): TOTAL PROTEIN, BODY FLUID OTHER: 4.7 g/dL

## 2017-12-03 LAB — PATHOLOGIST SMEAR REVIEW

## 2017-12-03 LAB — LD, BODY FLUID (OTHER): LD, BODY FLUID: 210 IU/L

## 2017-12-03 LAB — GLUCOSE, BODY FLUID OTHER: Glucose, Body Fluid Other: 98 mg/dL

## 2017-12-03 MED ORDER — POTASSIUM CHLORIDE 10 MEQ/50ML IV SOLN
10.0000 meq | INTRAVENOUS | Status: AC
  Administered 2017-12-03 (×2): 10 meq via INTRAVENOUS
  Filled 2017-12-03: qty 50

## 2017-12-03 MED ORDER — IBUPROFEN 400 MG PO TABS: 400.0000 mg | ORAL_TABLET | Freq: Three times a day (TID) | ORAL | Status: DC | PRN

## 2017-12-03 MED ORDER — INSULIN ASPART 100 UNIT/ML ~~LOC~~ SOLN
0.0000 [IU] | Freq: Three times a day (TID) | SUBCUTANEOUS | Status: DC
  Administered 2017-12-03: 2 [IU] via SUBCUTANEOUS

## 2017-12-03 MED ORDER — OXYCODONE HCL 5 MG PO TABS
15.0000 mg | ORAL_TABLET | ORAL | Status: DC | PRN
  Administered 2017-12-03 – 2017-12-06 (×9): 15 mg via ORAL
  Filled 2017-12-03 (×9): qty 3

## 2017-12-03 MED ORDER — MORPHINE SULFATE ER 15 MG PO TBCR
60.0000 mg | EXTENDED_RELEASE_TABLET | Freq: Two times a day (BID) | ORAL | Status: DC
  Administered 2017-12-03 – 2017-12-07 (×5): 60 mg via ORAL
  Filled 2017-12-03 (×6): qty 4

## 2017-12-03 NOTE — Progress Notes (Signed)
Pt transferred via ambulation from 2H17 to 2H21.

## 2017-12-03 NOTE — Progress Notes (Signed)
1 Day Post-Op Procedure(s) (LRB): SUBXYPHOID PERICARDIAL WINDOW (N/A) TRANSESOPHAGEAL ECHOCARDIOGRAM (TEE) (N/A) Subjective: Some discomfort Denies nausea No change in breathing  Objective: Vital signs in last 24 hours: Temp:  [97.4 F (36.3 C)-98.4 F (36.9 C)] 98.4 F (36.9 C) (03/07 0729) Pulse Rate:  [86-114] 90 (03/07 0700) Cardiac Rhythm: Normal sinus rhythm (03/07 0400) Resp:  [9-24] 16 (03/07 0700) BP: (101-139)/(49-82) 117/66 (03/07 0700) SpO2:  [96 %-100 %] 99 % (03/07 0700) Arterial Line BP: (103-158)/(51-68) 112/51 (03/07 0700) Weight:  [162 lb 0.6 oz (73.5 kg)] 162 lb 0.6 oz (73.5 kg) (03/07 0500)  Hemodynamic parameters for last 24 hours:    Intake/Output from previous day: 03/06 0701 - 03/07 0700 In: 3263.3 [P.O.:60; I.V.:2953.3; IV Piggyback:250] Out: 4281 [Urine:3970; Blood:15; Chest Tube:296] Intake/Output this shift: No intake/output data recorded.  General appearance: alert, cooperative and no distress Neurologic: intact Heart: regular rate and rhythm Lungs: rhonchi bilaterally serous drainage from pericardial drain  Lab Results: Recent Labs    12/01/17 1033 12/03/17 0351  WBC 5.5 10.9*  HGB 11.6* 11.7*  HCT 37.7* 36.7*  PLT 184 215   BMET:  Recent Labs    12/01/17 1033 12/03/17 0351  NA 137 138  K 4.4 3.7  CL 106 107  CO2 24 23  GLUCOSE 89 119*  BUN 8 9  CREATININE 0.63 0.56*  CALCIUM 8.7* 8.0*    PT/INR:  Recent Labs    12/01/17 1525  LABPROT 11.9*  INR 1.1   ABG    Component Value Date/Time   PHART 7.476 (H) 12/03/2017 0355   HCO3 25.5 12/03/2017 0355   O2SAT 98.5 12/03/2017 0355   CBG (last 3)  Recent Labs    12/02/17 2349 12/03/17 0341 12/03/17 0727  GLUCAP 209* 122* 137*    Assessment/Plan: S/P Procedure(s) (LRB): SUBXYPHOID PERICARDIAL WINDOW (N/A) TRANSESOPHAGEAL ECHOCARDIOGRAM (TEE) (N/A) Plan for transfer to step-down: see transfer orders  CV- stable  Keep pericardial drain until drainage  decreases  RESP- IS  RENAL- creatinine Ok, supplement K  ENDO- CBG elevated, change to AC/HS SSI   Pain control- PCA not effective   Was on MS continue 60 BID and oxycodone 15 Q4 PRN at home- will resume  Ambulate  SCD + enoxaparin for DVT prophylaxis   LOS: 1 day    [name] 12/03/2017

## 2017-12-03 NOTE — Progress Notes (Signed)
[facility] BRIEF SICU PROGRESS NOTE  1 Day Post-Op  S/P Procedure(s) (LRB): SUBXYPHOID PERICARDIAL WINDOW (N/A) TRANSESOPHAGEAL ECHOCARDIOGRAM (TEE) (N/A)   Stable day  Plan: Continue current plan  Rexene Alberts, MD 12/03/2017 7:23 PM

## 2017-12-04 ENCOUNTER — Inpatient Hospital Stay (HOSPITAL_COMMUNITY): Payer: Medicaid Other

## 2017-12-04 LAB — COMPREHENSIVE METABOLIC PANEL
ALT: 12 U/L — AB (ref 17–63)
AST: 28 U/L (ref 15–41)
Albumin: 2.8 g/dL — ABNORMAL LOW (ref 3.5–5.0)
Alkaline Phosphatase: 64 U/L (ref 38–126)
Anion gap: 9 (ref 5–15)
BUN: 7 mg/dL (ref 6–20)
CHLORIDE: 106 mmol/L (ref 101–111)
CO2: 24 mmol/L (ref 22–32)
CREATININE: 0.61 mg/dL (ref 0.61–1.24)
Calcium: 8.2 mg/dL — ABNORMAL LOW (ref 8.9–10.3)
GFR calc Af Amer: >60 mL/min (ref >60)
Glucose, Bld: 95 mg/dL (ref 65–99)
Potassium: 3.9 mmol/L (ref 3.5–5.1)
Sodium: 139 mmol/L (ref 135–145)
Total Bilirubin: 0.6 mg/dL (ref 0.3–1.2)
Total Protein: 6 g/dL — ABNORMAL LOW (ref 6.5–8.1)

## 2017-12-04 LAB — CBC
HEMATOCRIT: 37.2 % — AB (ref 39.0–52.0)
Hemoglobin: 11.5 g/dL — ABNORMAL LOW (ref 13.0–17.0)
MCH: 26.7 pg (ref 26.0–34.0)
MCHC: 30.9 g/dL (ref 30.0–36.0)
MCV: 86.5 fL (ref 78.0–100.0)
PLATELETS: 192 10*3/uL (ref 150–400)
RBC: 4.3 MIL/uL (ref 4.22–5.81)
RDW: 19 % — ABNORMAL HIGH (ref 11.5–15.5)
WBC: 6.3 10*3/uL (ref 4.0–10.5)

## 2017-12-04 LAB — GLUCOSE, CAPILLARY
GLUCOSE-CAPILLARY: 110 mg/dL — AB (ref 65–99)
Glucose-Capillary: 117 mg/dL — ABNORMAL HIGH (ref 65–99)
Glucose-Capillary: 105 mg/dL — ABNORMAL HIGH (ref 65–99)
Glucose-Capillary: 99 mg/dL (ref 65–99)

## 2017-12-04 MED ORDER — ENSURE ENLIVE PO LIQD
237.0000 mL | Freq: Three times a day (TID) | ORAL | Status: DC
  Administered 2017-12-04 – 2017-12-07 (×9): 237 mL via ORAL

## 2017-12-04 NOTE — Discharge Summary (Addendum)
[address]       [place] [address]             [phone]      Physician Discharge Summary  Patient ID: Edward Spence MRN: [id number] DOB/AGE: Feb 02, 1956 62 y.o.  Admit date: 12/02/2017 Discharge date: 12/07/2017  Admission Diagnoses: Patient Active Problem List   Diagnosis Date Noted  . 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 Dyspnea on exertion 11/18/2017  . Encounter for antineoplastic immunotherapy 08/25/2017  . Cavitary pneumonia 01/21/2017  . Neutropenia, drug-induced ([place]) 09/18/2016  . Constipation 07/23/2016  . Dehydration 07/13/2016  . Nausea without vomiting 07/13/2016  . Dysphagia 07/13/2016  . Rash 07/13/2016  . Antineoplastic chemotherapy induced pancytopenia (CODE) ([place]) 07/13/2016  . Encounter for antineoplastic chemotherapy 06/24/2016  . 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 Lung mass 05/19/2016    Discharge Diagnoses: Pericardial effusion- nonmalignant Active Problems:   S/P pericardial surgery   Discharged Condition: good  HPI:  Edward Spence is a 62 year old man with history of tobacco abuse who has stage IV lung cancer.  Recent CT of his chest showed a large pericardial effusion mostly along the diaphragmatic area.  He reportedly also complained of shortness of breath.  He had an echocardiogram and Dr. Wendy Poet office last week which showed a moderate to large pericardial effusion with no tamponade.  This was a free-flowing effusion.  He had normal left ventricular function.  Unfortunately his CT of the chest also showed progressive disease now with hepatic metastases.  He says he does not feel short of breath.  He does have a little orthopnea but that is not a new finding.  He has noted some swelling in his left foot and ankle today.  That is new.  He denies pain in the calf.  He says overall he feels better and has been gaining some  weight.   Hospital Course:  On 12/02/2017 Edward Spence underwent a subxiphoid pericardial window with Dr. Roxan Hockey. He tolerated the procedure well and was transferred to the ICU. He was extubated in a timely manner. POD 1 he was having some discomfort. We continued the pericardial drain. We resumed his PO pain medication from home. We continued his lovenox for DVT prophylaxis. POD 2 he continued to progress. He was tolerating room air with good oxygen saturation. His BP remained a little low but he was asymptomatic. His creatinine remained stable. We discontinued his pericardial drain. Follow up CXR was stable in appearance. We ordered Ensure since he was not eating the hospital food and he would drink protein shakes at home. He has had minimal pain and is ready for discharge home. Final pathology for pericardial tissue and fluid were negative for malignant cells.  Consults: None  Significant Diagnostic Studies:   CLINICAL DATA:  Pericardial effusion.  EXAM: PORTABLE CHEST 1 VIEW  COMPARISON:  Yesterday  FINDINGS: Decreased cardiopericardial size. A presumed pericardial drain is in similar position. Right IJ catheter in stable position. History of treated lung cancer with stable asymmetric right apical opacity and volume loss. Trace pneumoperitoneum may persist.  IMPRESSION: 1. Decreased and normal cardiopericardial size. 2. Otherwise stable.   Electronically Signed   By: Monte Fantasia M.D.   On: 12/03/2017 09:35   Treatments:   NAME:  KAVONTE, BEARSE            ACCOUNT NO.:  [id number]  MEDICAL RECORD NO.:  [id number]  LOCATION:                                 FACILITY:  PHYSICIAN:  Revonda Standard. Roxan Hockey, M.D.DATE OF BIRTH:  [birth year]  DATE OF PROCEDURE:  12/02/2017 DATE OF DISCHARGE:                              OPERATIVE REPORT   PREOPERATIVE DIAGNOSIS:  Pericardial effusion.  POSTOPERATIVE DIAGNOSIS:  Pericardial effusion.  PROCEDURE:   Subxiphoid pericardial window.  SURGEON:  Revonda Standard. Roxan Hockey, MD.  ASSISTANT:  Nicholes Rough, PA.  ANESTHESIA:  General.    Disposition: 01-Home or Self Care   Discharge Medications:   Allergies as of 12/07/2017      Reactions   Penicillins Hives, Other (See Comments)   At age 52 " I got trench mouth and hives " from PCN. Has patient had a PCN reaction causing immediate rash, facial/tongue/throat swelling, SOB or lightheadedness with hypotension: No HAS PT DEVELOPED SEVERE RASH INVOLVING MUCUS MEMBRANES or SKIN NECROSIS: #  #  #  YES  #  #  #  Has patient had a PCN reaction that required hospitalization: No Has patient had a PCN reaction occurring within the last 10 years: No      Medication List    TAKE these medications   albuterol 108 (90 Base) MCG/ACT inhaler Commonly known as:  PROVENTIL HFA;VENTOLIN HFA Inhale 2 puffs into the lungs every 6 (six) hours as needed for wheezing or shortness of breath.   crizotinib 250 MG capsule Commonly known as:  XALKORI Take 1 capsule (250 mg total) by mouth 2 (two) times daily.   FLUTTER Devi USE AS DIRECTED   ibuprofen 200 MG tablet Commonly known as:  ADVIL,MOTRIN Take 400 mg by mouth every 8 (eight) hours as needed (for pain.).   morphine 60 MG 12 hr tablet Commonly known as:  MS CONTIN Take 1 tablet (60 mg total) by mouth 3 (three) times daily. What changed:  when to take this   oxyCODONE 15 MG immediate release tablet Commonly known as:  ROXICODONE Take 1 tablet (15 mg total) by mouth every 4 (four) hours as needed. What changed:  reasons to take this      Follow-up Information    [name], MD Follow up.   Specialty:  Cardiothoracic Surgery Why:  Your appointment is on 12/22/2017 at 4:45pm. Please arrive at 4:15pm for a chest xray located at [facility] which is located on the first floor of our building.  Contact information: [address] [place]  [address] [phone]        [facility] Follow up on 12/14/2017.   Specialty:  Cardiothoracic Surgery Why:  Appointment is at 10:30 for suture removal Contact information: [address] [place], [place] [phone]          Signed: Ellwood Handler 12/07/2017, 3:31 PM

## 2017-12-04 NOTE — Discharge Instructions (Signed)
Pericardial Effusion Pericardial effusion is a buildup of fluid around your heart. The heart is surrounded by a double-layered sac (pericardium). This sac normally contains a small amount of fluid. When too much builds up, it can put pressure on your heart and cause problems. As fluid builds up in the pericardial sac and pressure on your heart increases, it becomes harder for your heart to pump blood. When fluid prevents your heart from pumping enough blood, it is called cardiac tamponade. Cardiac tamponade is a life-threatening condition. What are the causes? Often the cause of pericardial effusion is not known (idiopathic effusion). Possible causes are from:  Infections, such as from a virus, bacteria, fungus, or parasite.  Damage to the pericardium from heart surgery or a heart attack.  Inflammatory diseases, such as rheumatoid arthritis or lupus.  Kidney disease.  Thyroid disease.  Cancer.  Cancer treatment, including radiation or chemotherapy.  Certain drugs, including tuberculosis drugs or seizure drugs.  Chest trauma.  What are the signs or symptoms? Pericardial effusion may not cause symptoms at first, especially if the fluid builds up slowly. In time, pressure on the heart may cause:  Chest pain.  Trouble breathing.  Pain and shortness of breath that is worse when lying down.  Dizziness.  Fainting.  Cough.  Hiccups.  Skipped heartbeats (palpitations).  Anxiety and confusion.  A bluish skin color (cyanosis).  Swollen legs and ankles.  How is this diagnosed? Your health care provider may suspect pericardial effusion based on your symptoms. Your health care provider may also do a physical exam to check for:  Low blood pressure and weak pulses.  Soft (muffled) heart sounds.  Rapid heartbeat.  Full veins in your neck (distended jugular veins).  Decreased breathing sounds and a rubbing sound (friction rub) when listening to your lungs.  Your health care  provider may also do several tests to confirm the diagnosis and find out what is causing the pericardial effusion. These may include:  Chest X-ray.  Imaging study of the heart using sound waves (echocardiogram).  CT scan or MRI.  Electrical study of the heart (electrocardiogram [ECG]).  A procedure using a needle to remove fluid from the pericardium for examination (pericardiocentesis).  Blood tests to check for: ? Infection. ? Heart damage. ? Thyroid abnormalities. ? Kidney disease. ? Inflammatory disorders.  How is this treated? Treatment for pericardial effusion depends on the cause of your symptoms and how severe your symptoms are. If a specific cause was found, that cause will be treated. Treatment may include:  Medicines, such as: ? Nonsteroidal anti-inflammatory drugs (NSAIDs). ? Other anti-inflammatory drugs, such as steroids. ? Antibiotic medicine. ? Antifungal medicine.  Hospital treatment may be necessary, such as for cardiac tamponade. This may include: ? Intravenous (IV) fluids. ? Breathing support.  Surgery may be needed in severe cases. Surgery may include: ? Pericardiocentesis. ? Open heart surgery. ? A procedure to make a permanent opening in the pericardium (pericardial window).  Contact a health care provider if:  You feel dizzy or light-headed.  You have swelling in your legs or ankles.  You have heart palpitations.  You have persistent cough or hiccups. Get help right away if:  You faint.  You have chest pain.  You have trouble breathing. These symptoms may represent a serious problem that is an emergency. Do not wait to see if the symptoms will go away. Get medical help right away. Call your local emergency services (911 in the [place] Do not drive yourself to  the hospital. This information is not intended to replace advice given to you by your health care provider. Make sure you discuss any questions you have with your health care  provider. Document Released: 05/13/2005 Document Revised: 02/21/2016 Document Reviewed: 02/02/2014 Elsevier Interactive Patient Education  2017 [place].      Please wash incision with soap and water in the shower. Pat dry with a clean towel.  No baths or submerging the incision in water No lifting anything heavier than 5 lbs No driving until cleared by our office

## 2017-12-04 NOTE — Op Note (Signed)
NAME:  MARIANO, DOSHI NO.:  [id number]  MEDICAL RECORD NO.:  [id number]  LOCATION:                                 FACILITY:  PHYSICIAN:  Revonda Standard. Roxan Hockey, M.D.DATE OF BIRTH:  Jul 11, 1956  DATE OF PROCEDURE:  12/02/2017 DATE OF DISCHARGE:                              OPERATIVE REPORT   PREOPERATIVE DIAGNOSIS:  Pericardial effusion.  POSTOPERATIVE DIAGNOSIS:  Pericardial effusion.  PROCEDURE:  Subxiphoid pericardial window.  SURGEON:  Revonda Standard. Roxan Hockey, MD.  ASSISTANT:  Nicholes Rough, PA.  ANESTHESIA:  General.  FINDINGS:  Transesophageal echocardiography showed a large circumferential pericardial effusion with no tamponade.  400 mL of serous yellow fluid drained.  No residual effusion by TEE.  CLINICAL NOTE:  Mr. Savas is a 62 year old gentleman with stage IV lung cancer.  He has been treated with chemo and radiation.  He recently had a CT of the chest, which showed a large pericardial effusion.  An echocardiogram showed a moderate-to-large pericardial effusion with no tamponade.  He was referred for a subxiphoid pericardial window.  The indications, risks, benefits, and alternatives were discussed in detail with the patient.  He understood and accepted the risks and agreed to proceed.  OPERATIVE NOTE:  Mr. Corbello was brought to the operating room on December 02, 2017.  He was anesthetized and intubated.  Transesophageal echocardiography was performed by Dr. Annye Asa of the Anesthesia Service.  Findings included a large circumferential pericardial effusion  with no cardiac tamponade.  A Foley catheter was placed.  Intravenous antibiotics were administered.  The chest and abdomen were prepped and draped in usual sterile fashion.  A midline incision was made centered over the xiphoid process.  It was carried through the skin and subcutaneous tissue.  The rectus muscle was dissected off the xiphoid process and a small portion of the  rectus fascia was incised.  Upward traction was placed on the xiphoid, and the fatty tissue between the sternum and the pericardium was dissected off with electrocautery.  The pericardium was identified.  It was incised.  400 mL of serous fluid was evacuated.  Fluid was sent for chemistry, cell counts, and cytologies. A 2 x 2 cm portion of the anterior pericardium was resected using cautery.  This was sent for pathology.  A sucker was advanced into the pericardial space, and the pericardial space was explored to make sure there was no loculated fluid.  Transesophageal echocardiography confirmed complete resolution of the effusion.  A 28-French Blake drain was tunneled through a separate incision.  It was placed along the anterior surface of the heart within the pericardial space.  It was secured to skin with #1 silk suture.  The incision was closed with a #1 Vicryl fascial suture followed by 2-0 Vicryl subcutaneous suture and a 3-0 Vicryl subcuticular suture. Dermabond was applied.  The chest tube was placed to suction.  The patient was taken from the operating room to the postanesthetic care unit extubated and in good condition.     Revonda Standard Roxan Hockey, M.D.     SCH/MEDQ  D:  12/03/2017  T:  12/04/2017  Job:  [id number]

## 2017-12-04 NOTE — Plan of Care (Signed)
  Progressing Education: Knowledge of General Education information will improve 12/04/2017 0032 - Progressing by Peggye Pitt, RN Clinical Measurements: Will remain free from infection 12/04/2017 0032 - Progressing by Peggye Pitt, RN Coping: Level of anxiety will decrease 12/04/2017 0032 - Progressing by Peggye Pitt, RN Pain Managment: General experience of comfort will improve 12/04/2017 0032 - Progressing by Peggye Pitt, RN Skin Integrity: Risk for impaired skin integrity will decrease 12/04/2017 0032 - Progressing by Peggye Pitt, RN   Completed/Met Nutrition: Adequate nutrition will be maintained 12/04/2017 0032 - Completed/Met by Peggye Pitt, RN

## 2017-12-04 NOTE — Progress Notes (Addendum)
      [address]       [place] [address]             [phone]      2 Days Post-Op Procedure(s) (LRB): SUBXYPHOID PERICARDIAL WINDOW (N/A) TRANSESOPHAGEAL ECHOCARDIOGRAM (TEE) (N/A) Subjective: No issues overnight. He wants to go home ASAP.  Objective: Vital signs in last 24 hours: Temp:  [98.1 F (36.7 C)-98.7 F (37.1 C)] 98.1 F (36.7 C) (03/08 0429) Pulse Rate:  [85-105] 85 (03/08 0429) Cardiac Rhythm: Normal sinus rhythm (03/07 2153) Resp:  [14-22] 20 (03/08 0429) BP: (88-135)/(52-75) 99/70 (03/08 0429) SpO2:  [95 %-100 %] 98 % (03/08 0429) Arterial Line BP: (116)/(54) 116/54 (03/07 0800) Weight:  [162 lb (73.5 kg)] 162 lb (73.5 kg) (03/07 2153)    Intake/Output from previous day: 03/07 0701 - 03/08 0700 In: 100 [IV Piggyback:100] Out: 1600 [Urine:1480; Chest Tube:120] Intake/Output this shift: No intake/output data recorded.  General appearance: alert, cooperative and no distress Heart: regular rate and rhythm, S1, S2 normal, no murmur, click, rub or gallop Lungs: clear to auscultation bilaterally Abdomen: soft, non-tender; bowel sounds normal; no masses,  no organomegaly Extremities: extremities normal, atraumatic, no cyanosis or edema Wound: clean and dry  Lab Results: Recent Labs    12/03/17 0351 12/04/17 0533  WBC 10.9* 6.3  HGB 11.7* 11.5*  HCT 36.7* 37.2*  PLT 215 192   BMET:  Recent Labs    12/03/17 0351 12/04/17 0533  NA 138 139  K 3.7 3.9  CL 107 106  CO2 23 24  GLUCOSE 119* 95  BUN 9 7  CREATININE 0.56* 0.61  CALCIUM 8.0* 8.2*    PT/INR:  Recent Labs    12/01/17 1525  LABPROT 11.9*  INR 1.1   ABG    Component Value Date/Time   PHART 7.476 (H) 12/03/2017 0355   HCO3 25.5 12/03/2017 0355   O2SAT 98.5 12/03/2017 0355   CBG (last 3)  Recent Labs    12/03/17 1523 12/03/17 2159 12/04/17 0630  GLUCAP 127* 105* 110*    Assessment/Plan: S/P Procedure(s) (LRB): SUBXYPHOID PERICARDIAL WINDOW  (N/A) TRANSESOPHAGEAL ECHOCARDIOGRAM (TEE) (N/A)  1. CV-NSR in the 80s. BP low at times but seems better. Will follow closely. Only 120cc/24 hours out of the pericardial tube.  2. Pulm-tolerating room air with good oxygen saturation. CXR appears stable 3. Renal-creatinine 0.61, electrolytes okay 4. H and H is stable, platelets okay 5. Endo-blood glucose well controlled.   Plan: Small amount out of the pericardial drain and might be able to be removed. Will order Ensure since he isn't eating much. Probably home this weekend.    LOS: 2 days    Edward Spence 12/04/2017 Patient seen and examined, agree with above Drainage down but 120 over 24 hours is too much to pull the pericardial drain Path showed no malignancy or inflammation Hopefully can get tube out and send home over the weekend  [place]. Roxan Hockey, MD [facility] [phone]

## 2017-12-05 LAB — GLUCOSE, CAPILLARY
GLUCOSE-CAPILLARY: 87 mg/dL (ref 65–99)
GLUCOSE-CAPILLARY: 102 mg/dL — AB (ref 65–99)
Glucose-Capillary: 105 mg/dL — ABNORMAL HIGH (ref 65–99)
Glucose-Capillary: 109 mg/dL — ABNORMAL HIGH (ref 65–99)

## 2017-12-05 NOTE — Plan of Care (Signed)
  Progressing Health Behavior/Discharge Planning: Ability to manage health-related needs will improve 12/05/2017 0211 - Progressing by Peggye Pitt, RN Clinical Measurements: Will remain free from infection 12/05/2017 0211 - Progressing by Peggye Pitt, RN Coping: Level of anxiety will decrease 12/05/2017 0211 - Progressing by Peggye Pitt, RN Pain Managment: General experience of comfort will improve 12/05/2017 0211 - Progressing by Peggye Pitt, RN Safety: Ability to remain free from injury will improve 12/05/2017 0211 - Progressing by Peggye Pitt, RN   Completed/Met Education: Knowledge of General Education information will improve 12/05/2017 0211 - Completed/Met by Peggye Pitt, RN Elimination: Will not experience complications related to bowel motility 12/05/2017 0211 - Completed/Met by Peggye Pitt, RN Will not experience complications related to urinary retention 12/05/2017 0211 - Completed/Met by Peggye Pitt, RN

## 2017-12-05 NOTE — Progress Notes (Addendum)
      [address]       ,[place] [address]             [phone]      3 Days Post-Op Procedure(s) (LRB): SUBXYPHOID PERICARDIAL WINDOW (N/A) TRANSESOPHAGEAL ECHOCARDIOGRAM (TEE) (N/A) Subjective: Anxious to go home. Feeling much better.  Objective: Vital signs in last 24 hours: Temp:  [97.8 F (36.6 C)-99.7 F (37.6 C)] 98.2 F (36.8 C) (03/09 0902) Pulse Rate:  [89-100] 96 (03/09 0902) Cardiac Rhythm: Normal sinus rhythm (03/09 0701) Resp:  [12-20] 20 (03/09 0902) BP: (89-93)/(50-70) 93/55 (03/09 0902) SpO2:  [92 %-99 %] 92 % (03/09 0902)     Intake/Output from previous day: 03/08 0701 - 03/09 0700 In: 840 [P.O.:840] Out: 71 [Stool:1; Chest Tube:70] Intake/Output this shift: Total I/O In: 240 [P.O.:240] Out: -   General appearance: alert, cooperative and no distress Heart: regular rate and rhythm, S1, S2 normal, no murmur, click, rub or gallop Lungs: bilateral rhonchi Abdomen: soft, non-tender; bowel sounds normal; no masses,  no organomegaly Extremities: extremities normal, atraumatic, no cyanosis or edema Wound: clean and dry  Lab Results: Recent Labs    12/03/17 0351 12/04/17 0533  WBC 10.9* 6.3  HGB 11.7* 11.5*  HCT 36.7* 37.2*  PLT 215 192   BMET:  Recent Labs    12/03/17 0351 12/04/17 0533  NA 138 139  K 3.7 3.9  CL 107 106  CO2 23 24  GLUCOSE 119* 95  BUN 9 7  CREATININE 0.56* 0.61  CALCIUM 8.0* 8.2*    PT/INR: No results for input(s): LABPROT, INR in the last 72 hours. ABG    Component Value Date/Time   PHART 7.476 (H) 12/03/2017 0355   HCO3 25.5 12/03/2017 0355   O2SAT 98.5 12/03/2017 0355   CBG (last 3)  Recent Labs    12/04/17 1621 12/04/17 2112 12/05/17 0601  GLUCAP 105* 99 87    Assessment/Plan: S/P Procedure(s) (LRB): SUBXYPHOID PERICARDIAL WINDOW (N/A) TRANSESOPHAGEAL ECHOCARDIOGRAM (TEE) (N/A)  1. CV-NSR in the 90s. BP low at times but seems better. Will follow closely. Only 70cc/24 hours  out of the pericardial tube.  2. Pulm-tolerating room air with good oxygen saturation. CXR yesterday is stable 3. Renal-creatinine 0.61, electrolytes okay 4. H and H is stable, platelets okay 5. Endo-blood glucose well controlled.   Plan: Will discontinue pericardial tube when output is < 50cc/24 hours.  Home soon. Encourage incentive spirometer, able to hit 2500. Ambulate in the halls.    LOS: 3 days    Elgie Collard 12/05/2017   Chart reviewed, patient examined, agree with above. Keep pericardial tube in today and reevaluate in am. If tube comes out tomorrow he could go home later in the day.

## 2017-12-06 LAB — GLUCOSE, CAPILLARY
GLUCOSE-CAPILLARY: 134 mg/dL — AB (ref 65–99)
Glucose-Capillary: 88 mg/dL (ref 65–99)
Glucose-Capillary: 134 mg/dL — ABNORMAL HIGH (ref 65–99)
Glucose-Capillary: 128 mg/dL — ABNORMAL HIGH (ref 65–99)

## 2017-12-06 MED ORDER — NICOTINE 21 MG/24HR TD PT24
21.0000 mg | MEDICATED_PATCH | Freq: Every day | TRANSDERMAL | Status: DC
  Administered 2017-12-07: 21 mg via TRANSDERMAL
  Filled 2017-12-06: qty 1

## 2017-12-06 NOTE — Progress Notes (Addendum)
      [address]       [place] [address]             [phone]      4 Days Post-Op Procedure(s) (LRB): SUBXYPHOID PERICARDIAL WINDOW (N/A) TRANSESOPHAGEAL ECHOCARDIOGRAM (TEE) (N/A) Subjective: Patient is frustrated and is bored in the hospital.   Objective: Vital signs in last 24 hours: Temp:  [97.6 F (36.4 C)-99.1 F (37.3 C)] 99.1 F (37.3 C) (03/10 0727) Pulse Rate:  [83-94] 83 (03/10 0447) Cardiac Rhythm: Normal sinus rhythm (03/10 0700) Resp:  [17-25] 25 (03/10 0727) BP: (86-94)/(59-70) 86/59 (03/10 0727) SpO2:  [91 %-96 %] 95 % (03/10 0727)     Intake/Output from previous day: 03/09 0701 - 03/10 0700 In: 840 [P.O.:840] Out: 90 [Chest Tube:90] Intake/Output this shift: No intake/output data recorded.  General appearance: alert, cooperative and no distress Heart: regular rate and rhythm, S1, S2 normal, no murmur, click, rub or gallop Lungs: clear to auscultation bilaterally Abdomen: soft, non-tender; bowel sounds normal; no masses,  no organomegaly Extremities: extremities normal, atraumatic, no cyanosis or edema Wound: clean and dry  Lab Results: Recent Labs    12/04/17 0533  WBC 6.3  HGB 11.5*  HCT 37.2*  PLT 192   BMET:  Recent Labs    12/04/17 0533  NA 139  K 3.9  CL 106  CO2 24  GLUCOSE 95  BUN 7  CREATININE 0.61  CALCIUM 8.2*    PT/INR: No results for input(s): LABPROT, INR in the last 72 hours. ABG    Component Value Date/Time   PHART 7.476 (H) 12/03/2017 0355   HCO3 25.5 12/03/2017 0355   O2SAT 98.5 12/03/2017 0355   CBG (last 3)  Recent Labs    12/05/17 1635 12/05/17 2103 12/06/17 0636  GLUCAP 109* 102* 88    Assessment/Plan: S/P Procedure(s) (LRB): SUBXYPHOID PERICARDIAL WINDOW (N/A) TRANSESOPHAGEAL ECHOCARDIOGRAM (TEE) (N/A)  1. CV-NSR to ST in the 90s-100s. BP low at times but asymptomatic. Will follow closely. 90cc/24 hours out of the pericardial tube recorded.  2. Pulm-tolerating room air  with good oxygen saturation. CXR yesterday is stable 3. Renal-creatinine 0.61, electrolytes okay 4. H and H is stable, platelets okay 5. Endo-blood glucose well controlled.   Plan: Fluid in all three columns of the atrium therefore its hard to keep track of exact amount of output. Change out atrium today for more accurate recording. Will discontinue pericardial tube when output is < 50cc/24 hours. Ambulate in the halls.      LOS: 4 days    Elgie Collard 12/06/2017   Chart reviewed, patient examined, agree with above.

## 2017-12-06 NOTE — Progress Notes (Signed)
Chest tube canister changed per verbal order from [facility], will continue to monitor.

## 2017-12-07 ENCOUNTER — Inpatient Hospital Stay (HOSPITAL_COMMUNITY): Payer: Medicaid Other

## 2017-12-07 LAB — GLUCOSE, CAPILLARY
GLUCOSE-CAPILLARY: 87 mg/dL (ref 65–99)
Glucose-Capillary: 86 mg/dL (ref 65–99)

## 2017-12-07 NOTE — Progress Notes (Signed)
Order received to discharge patient.  Telemetry monitor applied and CCMD notified.  PIV access removed.  Discharge instructions, follow up, medications and instructions for their use discussed with patient.  

## 2017-12-07 NOTE — Progress Notes (Addendum)
      [address]       [place] [address]             [phone]      5 Days Post-Op Procedure(s) (LRB): SUBXYPHOID PERICARDIAL WINDOW (N/A) TRANSESOPHAGEAL ECHOCARDIOGRAM (TEE) (N/A)   Subjective:  No new complaints.  Feels great and wants to go home today.  + ambulation + BM  Objective: Vital signs in last 24 hours: Temp:  [98.1 F (36.7 C)-99.4 F (37.4 C)] 98.2 F (36.8 C) (03/11 0500) Pulse Rate:  [61-96] 96 (03/10 1628) Cardiac Rhythm: Normal sinus rhythm (03/10 1900) Resp:  [12-18] 12 (03/11 0500) BP: (91-98)/(63-72) 93/63 (03/11 0500) SpO2:  [96 %-97 %] 97 % (03/11 0500)  Intake/Output from previous day: 03/10 0701 - 03/11 0700 In: 1200 [P.O.:1200] Out: 16 [Chest Tube:16]  General appearance: alert, cooperative and no distress Heart: regular rate and rhythm Lungs: clear to auscultation bilaterally and coarse sounds, clear with cough Abdomen: soft, non-tender; bowel sounds normal; no masses,  no organomegaly Wound: clean and dry  Lab Results: No results for input(s): WBC, HGB, HCT, PLT in the last 72 hours. BMET: No results for input(s): NA, K, CL, CO2, GLUCOSE, BUN, CREATININE, CALCIUM in the last 72 hours.  PT/INR: No results for input(s): LABPROT, INR in the last 72 hours. ABG    Component Value Date/Time   PHART 7.476 (H) 12/03/2017 0355   HCO3 25.5 12/03/2017 0355   O2SAT 98.5 12/03/2017 0355   CBG (last 3)  Recent Labs    12/06/17 1625 12/06/17 2107 12/07/17 0609  GLUCAP 128* 134* 87    Assessment/Plan: S/P Procedure(s) (LRB): SUBXYPHOID PERICARDIAL WINDOW (N/A) TRANSESOPHAGEAL ECHOCARDIOGRAM (TEE) (N/A)  1. Chest tube- 40 cc since pleurovac change yesterday, will d/c chest tube today 2. CV-hemodynamically stable in NSR 3. Dispo- patient stable, d/c chest tube today, repeat CXR, home later today if remains stable   LOS: 5 days    Ellwood Handler 12/07/2017 Patient seen and examined, agree with above  Remo Lipps C.  Roxan Hockey, MD [facility] [phone]

## 2017-12-07 NOTE — Care Management Note (Signed)
Case Management Note Marvetta Gibbons RN, BSN Unit 4E-Case Manager [phone]  Patient Details  Name: Edward Spence MRN: [id number] Date of Birth: 05-25-1956  Subjective/Objective:     Pt admitted with pericardial effusion s/p pericardial window               Action/Plan: PTA pt lived at home- plan to return home- has assistance for discharge- no CM needs noted for transition home.   Expected Discharge Date:  12/07/17               Expected Discharge Plan:  Home/Self Care  In-House Referral:  NA  Discharge planning Services  CM Consult  Post Acute Care Choice:  NA Choice offered to:  NA  DME Arranged:    DME Agency:     HH Arranged:    HH Agency:     Status of Service:  Completed, signed off  If discussed at [place] of Stay Meetings, dates discussed:    Discharge Disposition: home/self care   Additional Comments:  Dawayne Patricia, RN 12/07/2017, 3:56 PM

## 2017-12-07 NOTE — Progress Notes (Signed)
CT tube removed per MD order without difficulty.  Will continue to monitor.

## 2017-12-07 NOTE — Progress Notes (Signed)
Called Radiology for 2V CXR.

## 2017-12-10 ENCOUNTER — Telehealth: Payer: Self-pay | Admitting: Medical Oncology

## 2017-12-10 ENCOUNTER — Other Ambulatory Visit: Payer: Self-pay | Admitting: Medical Oncology

## 2017-12-10 DIAGNOSIS — C3411 Malignant neoplasm of upper lobe, right bronchus or lung: Secondary | ICD-10-CM

## 2017-12-10 DIAGNOSIS — J189 Pneumonia, unspecified organism: Secondary | ICD-10-CM

## 2017-12-10 DIAGNOSIS — Z5111 Encounter for antineoplastic chemotherapy: Secondary | ICD-10-CM

## 2017-12-10 DIAGNOSIS — J984 Other disorders of lung: Secondary | ICD-10-CM

## 2017-12-10 MED ORDER — MORPHINE SULFATE ER 60 MG PO TBCR: 60.0000 mg | EXTENDED_RELEASE_TABLET | Freq: Three times a day (TID) | ORAL | 0 refills | Status: DC

## 2017-12-10 MED ORDER — OXYCODONE HCL 15 MG PO TABS: 15.0000 mg | ORAL_TABLET | ORAL | 0 refills | Status: DC | PRN

## 2017-12-10 NOTE — Telephone Encounter (Signed)
requests refill to pick up monday

## 2017-12-11 ENCOUNTER — Other Ambulatory Visit: Payer: Self-pay | Admitting: Internal Medicine

## 2017-12-14 ENCOUNTER — Other Ambulatory Visit: Payer: Self-pay

## 2017-12-14 ENCOUNTER — Ambulatory Visit (INDEPENDENT_AMBULATORY_CARE_PROVIDER_SITE_OTHER): Payer: Self-pay

## 2017-12-14 DIAGNOSIS — Z4802 Encounter for removal of sutures: Secondary | ICD-10-CM

## 2017-12-14 NOTE — Progress Notes (Signed)
Patient arrived for nurse visit to remove suture post- procedure s/p pericardial window 12/02/2017.  Suture removed with no signs/ symptoms of infection noted.  The incision appeared to have slight redness to it, due to the stitch irritation.  Once the stitch was removed the incision did open about 0.5 inch depth/ length.  The wound was cleaned with peroxide and covered with a dry dressing and for dressing to be removed once home.   Patient tolerated procedure well.  Patient/ family instructed to keep the incision sites clean and dry and when showering wash with soap and water and pat dry.  I also explained to make sure he keeps an eye on the incision and to watch for signs and symptoms of infection.  If any was noted to give the office a call, he acknowledged receipt.    Patient/ family acknowledged instructions given.

## 2017-12-15 ENCOUNTER — Telehealth: Payer: Self-pay | Admitting: Medical Oncology

## 2017-12-15 ENCOUNTER — Other Ambulatory Visit: Payer: Self-pay

## 2017-12-15 ENCOUNTER — Ambulatory Visit: Payer: Medicaid Other | Admitting: Internal Medicine

## 2017-12-15 ENCOUNTER — Other Ambulatory Visit: Payer: Medicaid Other

## 2017-12-15 ENCOUNTER — Ambulatory Visit: Payer: Medicaid Other

## 2017-12-15 MED FILL — XALKORI 250 MG CAPSULE: 250 | 30 days supply | Qty: 60 | Fill #1

## 2017-12-15 NOTE — Telephone Encounter (Signed)
Pt missed appt today. I LVM to return call.

## 2017-12-16 ENCOUNTER — Ambulatory Visit: Payer: Medicaid Other | Admitting: Cardiology

## 2017-12-16 ENCOUNTER — Telehealth: Payer: Self-pay | Admitting: Medical Oncology

## 2017-12-16 NOTE — Telephone Encounter (Signed)
Called about missed appt. "He is on the pills now". I left message w ANNA, that he still needs to be seen and submitted schedule request for next week.

## 2017-12-17 ENCOUNTER — Telehealth: Payer: Self-pay | Admitting: Internal Medicine

## 2017-12-17 NOTE — Telephone Encounter (Signed)
Scheduled appt per 3/20 sch message - left message with appt date and time and sent reminder letter in the mail.

## 2017-12-21 ENCOUNTER — Other Ambulatory Visit: Payer: Self-pay | Admitting: Thoracic Surgery (Cardiothoracic Vascular Surgery)

## 2017-12-21 DIAGNOSIS — I313 Pericardial effusion (noninflammatory): Principal | ICD-10-CM

## 2017-12-21 DIAGNOSIS — I3131 Malignant pericardial effusion in diseases classified elsewhere: Secondary | ICD-10-CM

## 2017-12-21 DIAGNOSIS — C801 Malignant (primary) neoplasm, unspecified: Secondary | ICD-10-CM

## 2017-12-22 ENCOUNTER — Ambulatory Visit
Admission: RE | Admit: 2017-12-22 | Discharge: 2017-12-22 | Disposition: A | Discharge status: Home / Self Care | Payer: Medicaid Other | Source: Ambulatory Visit | Attending: Thoracic Surgery (Cardiothoracic Vascular Surgery) | Admitting: Thoracic Surgery (Cardiothoracic Vascular Surgery)

## 2017-12-22 ENCOUNTER — Other Ambulatory Visit: Payer: Self-pay

## 2017-12-22 ENCOUNTER — Other Ambulatory Visit: Payer: Medicaid Other

## 2017-12-22 ENCOUNTER — Encounter: Payer: Self-pay | Admitting: Thoracic Surgery (Cardiothoracic Vascular Surgery)

## 2017-12-22 ENCOUNTER — Ambulatory Visit (INDEPENDENT_AMBULATORY_CARE_PROVIDER_SITE_OTHER): Payer: Self-pay | Admitting: Thoracic Surgery (Cardiothoracic Vascular Surgery)

## 2017-12-22 ENCOUNTER — Ambulatory Visit: Payer: Medicaid Other | Admitting: Oncology

## 2017-12-22 ENCOUNTER — Telehealth: Payer: Self-pay | Admitting: Oncology

## 2017-12-22 ENCOUNTER — Telehealth: Payer: Self-pay | Admitting: Medical Oncology

## 2017-12-22 VITALS — BP 84/51 | HR 109 | Resp 16 | Ht 75.0 in | Wt 165.4 lb

## 2017-12-22 DIAGNOSIS — Z9889 Other specified postprocedural states: Secondary | ICD-10-CM

## 2017-12-22 DIAGNOSIS — I313 Pericardial effusion (noninflammatory): Principal | ICD-10-CM

## 2017-12-22 DIAGNOSIS — I3131 Malignant pericardial effusion in diseases classified elsewhere: Secondary | ICD-10-CM

## 2017-12-22 DIAGNOSIS — I3139 Other pericardial effusion (noninflammatory): Secondary | ICD-10-CM

## 2017-12-22 DIAGNOSIS — C801 Malignant (primary) neoplasm, unspecified: Secondary | ICD-10-CM

## 2017-12-22 NOTE — Telephone Encounter (Signed)
He needs follow up for evaluation soon. No more refills unless we check him up and make sure he has no lab abnormalities or adverse effects from the treatment. Thank you.

## 2017-12-22 NOTE — Telephone Encounter (Addendum)
Pt does not know how to check messages on his phone, so he missed appt.. Call Vicente Males with all appts [phone]. He just received new xalkori.

## 2017-12-22 NOTE — Progress Notes (Signed)
[address]       [place] [address]             [phone]     HPI: Edward Spence returns for scheduled follow-up visit  He is a 62 year old man with a history of tobacco abuse and stage IV lung cancer.  He had a CT which showed a large pericardial effusion and then an echocardiogram which showed a moderate to large pericardial effusion, but no tamponade.  I saw him in February.  He had a little bit of orthopnea but not much shortness of breath with exertion.  He had mild peripheral edema.  I did a subxiphoid pericardial window on 12/02/2017.  I drained about 400 mL of serous fluid.  Pathology did not show any significant inflammation or any signs of metastatic disease.  He went home on postoperative day #5.  He says he has been feeling well.  His son from [place] is visiting with him and he is very excited about that.  He says his breathing is a little better than it was prior to the procedure, but that it was not that bad to start with.  He is not having too much pain.  Current Outpatient Medications  Medication Sig Dispense Refill  . albuterol (PROVENTIL HFA;VENTOLIN HFA) 108 (90 Base) MCG/ACT inhaler Inhale 2 puffs into the lungs every 6 (six) hours as needed for wheezing or shortness of breath. 1 Inhaler 2  . crizotinib (XALKORI) 250 MG capsule Take 1 capsule (250 mg total) by mouth 2 (two) times daily. 60 capsule 2  . ibuprofen (ADVIL,MOTRIN) 200 MG tablet Take 400 mg by mouth every 8 (eight) hours as needed (for pain.).    Marland Kitchen morphine (MS CONTIN) 60 MG 12 hr tablet Take 1 tablet (60 mg total) by mouth 3 (three) times daily. 90 tablet 0  . oxyCODONE (ROXICODONE) 15 MG immediate release tablet Take 1 tablet (15 mg total) by mouth every 4 (four) hours as needed. 120 tablet 0  . Respiratory Therapy Supplies (FLUTTER) DEVI USE AS DIRECTED 1 each 0   No current facility-administered medications for this visit.     Physical Exam BP (!) 84/51 (BP Location: Right Arm,  Patient Position: Sitting, Cuff Size: Normal)   Pulse (!) 109   Resp 16   Ht 6\' 3"  (1.905 m)   Wt 165 lb 6.4 oz (75 kg)   SpO2 98% Comment: RA  BMI 20.38 kg/m  62 year old man in no acute distress Alert and oriented x3 with no focal deficits No JVD Cardiac tachycardic and regular, no rub Lungs diminished bilaterally but equal with no rales or wheezing No peripheral edema  Diagnostic Tests: CHEST - 2 VIEW  COMPARISON:  Chest radiographs 12/07/2017 and 11/2017. Chest CT 11/16/2017.  FINDINGS: The heart size is normal. There is stable superior retraction of the right hilum secondary to a cavitary right upper lobe process with volume loss. Pulmonary nodularity seen on CT is not well visualized. There is no new airspace disease, pleural effusion or pneumothorax. Old rib fracture noted on the left.  IMPRESSION: Radiographically stable appearance of the chest with right upper lobe volume loss and cavitation. No change in the size of the cardiac silhouette.   Electronically Signed   By: Richardean Sale M.D.   On: 12/22/2017 16:56 I personally reviewed the chest x-ray and concur with the findings noted above.  Impression: Edward Spence is a 62 year old gentleman with a history of metastatic lung cancer  who underwent a subxiphoid pericardial window for a pericardial effusion back on 12/02/2017.  Interestingly there was neither inflammation nor malignancy noted on pathology.  The fluid did have an elevated LDH.    He feels well.  He is a little tachycardic.  His blood pressure is relatively low but was relatively low throughout his hospitalization.  His chest x-ray shows a normal cardiac silhouette and he does not have any signs of pericardial effusion on exam.  I do not think an echo is needed at this time, but will need to keep an eye on him.  He may drive.  Appropriate precautions were discussed.  He should not lift anything over 10 pounds for another 3  weeks.  Plan: Follow-up with oncology  [name], MD [facility] [phone]

## 2017-12-22 NOTE — Telephone Encounter (Signed)
Scheduled appt per 3/26 sch message - patient is aware of appt date and time.

## 2017-12-29 ENCOUNTER — Encounter: Payer: Self-pay | Admitting: Oncology

## 2017-12-29 ENCOUNTER — Telehealth: Payer: Self-pay | Admitting: Oncology

## 2017-12-29 ENCOUNTER — Inpatient Hospital Stay (HOSPITAL_BASED_OUTPATIENT_CLINIC_OR_DEPARTMENT_OTHER): Payer: Medicaid Other | Admitting: Oncology

## 2017-12-29 ENCOUNTER — Inpatient Hospital Stay: Payer: Medicaid Other | Attending: Internal Medicine

## 2017-12-29 VITALS — BP 97/64 | HR 104 | Temp 98.3°F | Resp 18 | Ht 75.0 in | Wt 165.4 lb

## 2017-12-29 DIAGNOSIS — C7951 Secondary malignant neoplasm of bone: Secondary | ICD-10-CM | POA: Insufficient documentation

## 2017-12-29 DIAGNOSIS — R630 Anorexia: Secondary | ICD-10-CM

## 2017-12-29 DIAGNOSIS — C3411 Malignant neoplasm of upper lobe, right bronchus or lung: Secondary | ICD-10-CM | POA: Diagnosis present

## 2017-12-29 DIAGNOSIS — J90 Pleural effusion, not elsewhere classified: Secondary | ICD-10-CM | POA: Diagnosis not present

## 2017-12-29 DIAGNOSIS — R52 Pain, unspecified: Secondary | ICD-10-CM | POA: Diagnosis not present

## 2017-12-29 DIAGNOSIS — R634 Abnormal weight loss: Secondary | ICD-10-CM | POA: Diagnosis not present

## 2017-12-29 DIAGNOSIS — Z5111 Encounter for antineoplastic chemotherapy: Secondary | ICD-10-CM

## 2017-12-29 LAB — CBC WITH DIFFERENTIAL (CANCER CENTER ONLY)
BASOS ABS: 0 10*3/uL (ref 0.0–0.1)
Basophils Relative: 0 %
EOS PCT: 2 %
Eosinophils Absolute: 0.1 10*3/uL (ref 0.0–0.5)
HCT: 38.4 % (ref 38.4–49.9)
Hemoglobin: 11.8 g/dL — ABNORMAL LOW (ref 13.0–17.1)
Lymphocytes Relative: 13 %
Lymphs Abs: 0.6 10*3/uL — ABNORMAL LOW (ref 0.9–3.3)
MCH: 26.2 pg — ABNORMAL LOW (ref 27.2–33.4)
MCHC: 30.7 g/dL — ABNORMAL LOW (ref 32.0–36.0)
MCV: 85.3 fL (ref 79.3–98.0)
MONO ABS: 0.5 10*3/uL (ref 0.1–0.9)
Monocytes Relative: 13 %
Neutro Abs: 3 10*3/uL (ref 1.5–6.5)
Neutrophils Relative %: 72 %
PLATELETS: 222 10*3/uL (ref 140–400)
RBC: 4.5 MIL/uL (ref 4.20–5.82)
RDW: 17.8 % — AB (ref 11.0–14.6)
WBC Count: 4.2 10*3/uL (ref 4.0–10.3)

## 2017-12-29 LAB — CMP (CANCER CENTER ONLY)
ALBUMIN: 2.7 g/dL — AB (ref 3.5–5.0)
ALT: 13 U/L (ref 0–55)
AST: 40 U/L — AB (ref 5–34)
Alkaline Phosphatase: 115 U/L (ref 40–150)
Anion gap: 7 (ref 3–11)
BILIRUBIN TOTAL: 0.4 mg/dL (ref 0.2–1.2)
BUN: 7 mg/dL (ref 7–26)
CALCIUM: 9.1 mg/dL (ref 8.4–10.4)
CO2: 27 mmol/L (ref 22–29)
CREATININE: 0.7 mg/dL (ref 0.70–1.30)
Chloride: 105 mmol/L (ref 98–109)
GFR, Est AFR Am: >60 mL/min (ref >60)
GFR, Estimated: >60 mL/min (ref >60)
GLUCOSE: 94 mg/dL (ref 70–140)
Potassium: 4.3 mmol/L (ref 3.5–5.1)
Sodium: 139 mmol/L (ref 136–145)
TOTAL PROTEIN: 6.9 g/dL (ref 6.4–8.3)

## 2017-12-29 LAB — PHOSPHORUS: Phosphorus: 2.5 mg/dL (ref 2.5–4.6)

## 2017-12-29 LAB — MAGNESIUM: Magnesium: 2.1 mg/dL (ref 1.5–2.5)

## 2017-12-29 NOTE — Telephone Encounter (Signed)
Scheduled appt per 4/2 los - sent reminder letter in the mail - lab and ct / follow up in one month.

## 2017-12-29 NOTE — Assessment & Plan Note (Signed)
This is a very pleasant 62 year old white male with a stage IV non-small cell lung cancer, adenocarcinoma status post 6 cycles of induction systemic chemotherapy with carbo platinum, Alimta and Avastin with partial response. This was followed by maintenance treatment with Alimta and Avastin for 3 cycles and his treatment was discontinued secondary to development of cavitary pneumonia in the right upper lobe. He was on observation for several months followed by disease progression on imaging studies. The patient was a started on second line treatment with Nivolumab 480 mg IV every 4 weeks status post 3 cycles discontinued secondary to disease progression.   The patient is now on treatment with Xalkori 250 mg p.o. twice daily as the patient has MET amplification his molecular studies.  Status post about 1 month of treatment.  He is tolerating this medication fairly well with no concerning complaints.  The patient was seen with Dr. Julien Nordmann.  Recommend that he continue Xalkori 250 mg twice a day.  The patient was reminded that he needs to keep his follow-up appointments as scheduled for safe monitoring of his Xalkori.  Patient states understanding of this.  The patient will follow-up in approximately 1 month with a restaging CT scan of the chest, abdomen, pelvis prior to that visit.  For pain management he will continue on MS Contin and oxycodone.  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  He was advised to call immediately if he has any concerning symptoms in the interval. The patient voices understanding of current disease status and treatment options and is in agreement with the current care plan. All questions were answered. The patient knows to call the clinic with any problems, questions or concerns. We can certainly see the patient much sooner if necessary.

## 2017-12-29 NOTE — Progress Notes (Signed)
[place], FNP [address] [place] [address]  DIAGNOSIS: Stage IV (T3, N3, M1b) non-small cell lung cancer, adenocarcinoma diagnosed in August 2017 and presented with right upper lobe lung nodule as well as large right superior mediastinal and right paratracheal lymphadenopathy and bone metastases in the right iliac and L5 vertebral body.  GUARDANT 360: Negative for EGFR, ALK, ROS 1, BRAF mutations but showed amplification of EGFR and MET  PRIOR THERAPY: 1) Palliative systemic chemotherapy to the right superior mediastinal and right paratracheal lymphadenopathy under the care of Dr. Lisbeth Renshaw. 2) Systemic chemotherapy with carboplatin for AUC of 5, Alimta 500 MG/M2 and Avastin 15 MG/KG every 3 weeks is status post 6 cycles. First cycle was given on 07/28/2016.  3) Maintenance systemic chemotherapy with Alimta 500 MG/M2 and Avastin 15 MG/KG every 3 weeks. First dose 11/19/2016. Status post 3 cycles. Avastin was discontinued after cycle #3 secondary to development of right upper lobe cavitary lesion. Treatment is currently on hold secondary to the cavitary lesion in the right upper lobe. 4) palliative radiotherapy to metastatic bone disease. 5) Second line treatment with immunotherapy with Nivolumab 480 mg IV every 4 weeks.  First cycle August 25, 2017.  Status post 3 cycles.  This was discontinued today secondary to disease progression.  CURRENT THERAPY: Xalkori 250 mg p.o. twice daily for radiation with non-small cell lung cancer and MET amplification.   First dose started on approximately 11/23/2017.  Status post once month of treatment.  INTERVAL HISTORY: Edward Spence 62 y.o. male returns for routine follow-up visit.  Patient missed several appointments with [place] for monitoring of adverse effects related to treatment.  The patient reports that he is feeling fine today with no specific complaints except for ongoing pain.  He remains on MS  Contin and oxycodone as needed.  Since his last visit, the patient underwent a subxiphoid pericardial window on 12/02/2017.  400 cc of serous fluid was obtained and pathology did not show any significant inflammation or signs of metastatic disease.  The patient denies fevers and chills.  Denies chest pain, shortness of breath, cough, hemoptysis.  Denies nausea, vomiting, constipation, diarrhea.  Denies visual changes except for floaters on one occasion that lasted for several hours.  Reports a decreased appetite and he has lost a few pounds since his last visit.  The patient is here for evaluation and repeat lab work.  MEDICAL HISTORY: Past Medical History:  Diagnosis Date  . Anemia    received 2-3 transfusion while on chemo back in 2018  . Cavitary pneumonia 01/21/2017  . Constipation 07/23/2016  . Encounter for antineoplastic chemotherapy 06/24/2016  . Lung cancer ([place])   . Lung mass 05/19/2016  . Pneumonia    in college    ALLERGIES:  is allergic to penicillins.  MEDICATIONS:  Current Outpatient Medications  Medication Sig Dispense Refill  . albuterol (PROVENTIL HFA;VENTOLIN HFA) 108 (90 Base) MCG/ACT inhaler Inhale 2 puffs into the lungs every 6 (six) hours as needed for wheezing or shortness of breath. 1 Inhaler 2  . crizotinib (XALKORI) 250 MG capsule Take 1 capsule (250 mg total) by mouth 2 (two) times daily. 60 capsule 2  . ibuprofen (ADVIL,MOTRIN) 200 MG tablet Take 400 mg by mouth every 8 (eight) hours as needed (for pain.).    Marland Kitchen morphine (MS CONTIN) 60 MG 12 hr tablet Take 1 tablet (60 mg total) by mouth 3 (three) times daily. 90 tablet 0  .  oxyCODONE (ROXICODONE) 15 MG immediate release tablet Take 1 tablet (15 mg total) by mouth every 4 (four) hours as needed. 120 tablet 0  . Respiratory Therapy Supplies (FLUTTER) DEVI USE AS DIRECTED 1 each 0   No current facility-administered medications for this visit.     SURGICAL HISTORY:  Past Surgical History:  Procedure Laterality  Date  . APPENDECTOMY    . SUBXYPHOID PERICARDIAL WINDOW N/A 12/02/2017   Procedure: SUBXYPHOID PERICARDIAL WINDOW;  Surgeon: [name], MD;  Location: [facility];  Service: Thoracic;  Laterality: N/A;  . TEE WITHOUT CARDIOVERSION N/A 12/02/2017   Procedure: TRANSESOPHAGEAL ECHOCARDIOGRAM (TEE);  Surgeon: [name], MD;  Location: [place];  Service: Thoracic;  Laterality: N/A;  . VIDEO BRONCHOSCOPY Bilateral 02/27/2017   Procedure: VIDEO BRONCHOSCOPY WITH FLUORO;  Surgeon: Marshell Garfinkel, MD;  Location: [facility];  Service: Cardiopulmonary;  Laterality: Bilateral;  . VIDEO BRONCHOSCOPY WITH ENDOBRONCHIAL ULTRASOUND N/A 05/22/2016   Procedure: VIDEO BRONCHOSCOPY WITH ENDOBRONCHIAL ULTRASOUND;  Surgeon: [name], MD;  Location: [place];  Service: Thoracic;  Laterality: N/A;    REVIEW OF SYSTEMS:   Review of Systems  Constitutional: Negative for chills, fever. Positive for decreased appetite, weight loss, and fatigue. HENT:   Negative for mouth sores, nosebleeds, sore throat and trouble swallowing.   Eyes: Negative for eye problems and icterus.  Respiratory: Negative for cough, hemoptysis, shortness of breath and wheezing.   Cardiovascular: Negative for chest pain and leg swelling.  Gastrointestinal: Negative for abdominal pain, constipation, diarrhea, nausea and vomiting.  Genitourinary: Negative for bladder incontinence, difficulty urinating, dysuria, frequency and hematuria.   Musculoskeletal: Negative for back pain, gait problem, neck pain and neck stiffness.  Skin: Negative for itching and rash.  Neurological: Negative for dizziness, extremity weakness, gait problem, headaches, light-headedness and seizures.  Hematological: Negative for adenopathy. Does not bruise/bleed easily.  Psychiatric/Behavioral: Negative for confusion, depression and sleep disturbance. The patient is not nervous/anxious.     PHYSICAL EXAMINATION:  Blood pressure 97/64, pulse (!) 104,  temperature 98.3 F (36.8 C), temperature source Oral, resp. rate 18, height 6' 3"  (1.905 m), weight 165 lb 6.4 oz (75 kg), SpO2 99 %.  ECOG PERFORMANCE STATUS: 1 - Symptomatic but completely ambulatory  Physical Exam  Constitutional: Oriented to person, place, and time and well-developed, well-nourished, and in no distress. No distress.  HENT:  Head: Normocephalic and atraumatic.  Mouth/Throat: Oropharynx is clear and moist. No oropharyngeal exudate.  Eyes: Conjunctivae are normal. Right eye exhibits no discharge. Left eye exhibits no discharge. No scleral icterus.  Neck: Normal range of motion. Neck supple.  Cardiovascular: Normal rate, regular rhythm, normal heart sounds and intact distal pulses.   Pulmonary/Chest: Effort normal and breath sounds normal. No respiratory distress. No wheezes. No rales.  Abdominal: Soft. Bowel sounds are normal. Exhibits no distension and no mass. There is no tenderness.  Musculoskeletal: Normal range of motion. Exhibits no edema.  Lymphadenopathy:    No cervical adenopathy.  Neurological: Alert and oriented to person, place, and time. Exhibits normal muscle tone. Gait normal. Coordination normal.  Skin: Skin is warm and dry. No rash noted. Not diaphoretic. No erythema. No pallor.  Psychiatric: Mood, memory and judgment normal.  Vitals reviewed.  LABORATORY DATA: Lab Results  Component Value Date   WBC 4.2 12/29/2017   HGB 11.5 (L) 12/04/2017   HCT 38.4 12/29/2017   MCV 85.3 12/29/2017   PLT 222 12/29/2017      Chemistry      Component  Value Date/Time   NA 139 12/29/2017 0955   NA 139 09/24/2017 0930   K 4.3 12/29/2017 0955   K 3.7 09/24/2017 0930   CL 105 12/29/2017 0955   CO2 27 12/29/2017 0955   CO2 24 09/24/2017 0930   BUN 7 12/29/2017 0955   BUN 7.6 09/24/2017 0930   CREATININE 0.70 12/29/2017 0955   CREATININE 0.7 09/24/2017 0930      Component Value Date/Time   CALCIUM 9.1 12/29/2017 0955   CALCIUM 9.2 09/24/2017 0930    ALKPHOS 115 12/29/2017 0955   ALKPHOS 63 09/24/2017 0930   AST 40 (H) 12/29/2017 0955   AST 11 09/24/2017 0930   ALT 13 12/29/2017 0955   ALT 6 09/24/2017 0930   BILITOT 0.4 12/29/2017 0955   BILITOT 0.36 09/24/2017 0930       RADIOGRAPHIC STUDIES:  Dg Chest 2 View  Result Date: 12/22/2017 CLINICAL DATA:  Malignant pericardial effusion. Non-small-cell lung cancer. EXAM: CHEST - 2 VIEW COMPARISON:  Chest radiographs 12/07/2017 and 11/2017. Chest CT 11/16/2017. FINDINGS: The heart size is normal. There is stable superior retraction of the right hilum secondary to a cavitary right upper lobe process with volume loss. Pulmonary nodularity seen on CT is not well visualized. There is no new airspace disease, pleural effusion or pneumothorax. Old rib fracture noted on the left. IMPRESSION: Radiographically stable appearance of the chest with right upper lobe volume loss and cavitation. No change in the size of the cardiac silhouette. Electronically Signed   By: Richardean Sale M.D.   On: 12/22/2017 16:56   Dg Chest 2 View  Result Date: 12/07/2017 CLINICAL DATA:  Chest tube removal EXAM: CHEST - 2 VIEW COMPARISON:  Chest x-rays dated 12/04/2017 and 12/02/2017. FINDINGS: Heart size and mediastinal contours are stable. Previous right IJ central line and previous pericardial drain have both been removed. No pleural effusion or pneumothorax seen. Right suprahilar and medial apex opacities are stable, the associated neoplastic process better demonstrated on earlier chest CT of 11/16/2017. No acute or suspicious osseous finding. IMPRESSION: Support apparatus has been removed. No pleural effusion or pneumothorax. Stable findings within the right suprahilar lung and medial lung apex, associated neoplastic process better demonstrated on earlier chest CT. Electronically Signed   By: Franki Cabot M.D.   On: 12/07/2017 12:03   Dg Chest 2 View  Result Date: 12/01/2017 CLINICAL DATA:  Preoperative examination for  the pericardial effusion. The patient is undergoing treatment of right lung cancer. EXAM: CHEST  2 VIEW COMPARISON:  CT scan of the chest of November 16, 2017 and chest x-ray of April 06, 2017. FINDINGS: There is persistent abnormal soft tissue density in the right upper hemithorax. This is fairly stable in appearance since the CT scan of February 18th 2019. The left lung is clear. The heart and pulmonary vascularity are normal. There is mild superior retraction of the right hilum. There is no acute bony abnormality. IMPRESSION: Fairly stable changes in the right upper hemithorax consistent with known malignancy and treatment for same. There has not been significant interval change since the CT scan of approximately 2 and half weeks ago. The cardiac silhouette is not enlarged. There is a known pericardial effusion from the CT scan. Electronically Signed   By: David  Martinique M.D.   On: 12/01/2017 13:51   Dg Chest Port 1 View  Result Date: 12/04/2017 CLINICAL DATA:  62 year old male postoperative day 2 status post subxiphoid pericardial window for treatment of pericardial effusion. Right lung cancer. EXAM:  PORTABLE CHEST 1 VIEW COMPARISON:  12/03/2017 and earlier. FINDINGS: Portable AP semi upright view at 0656 hours. Right IJ central line removed. Cardiac silhouette is within normal limits. Stable mediastinal contours elsewhere, including chronically abnormal right paratracheal contour associated with masslike right upper lung opacity. Lung volumes appear to be at baseline. No pneumothorax or pulmonary edema. No pleural effusion. Chronic left lateral 8th rib fracture. Negative visible bowel gas pattern. IMPRESSION: 1. Right IJ central line removed. 2. Cardiac silhouette within normal limits. Known right upper lung malignancy. 3. No new cardiopulmonary abnormality. Electronically Signed   By: Genevie Ann M.D.   On: 12/04/2017 08:54   Dg Chest Port 1 View  Result Date: 12/03/2017 CLINICAL DATA:  Pericardial effusion.  EXAM: PORTABLE CHEST 1 VIEW COMPARISON:  Yesterday FINDINGS: Decreased cardiopericardial size. A presumed pericardial drain is in similar position. Right IJ catheter in stable position. History of treated lung cancer with stable asymmetric right apical opacity and volume loss. Trace pneumoperitoneum may persist. IMPRESSION: 1. Decreased and normal cardiopericardial size. 2. Otherwise stable. Electronically Signed   By: Monte Fantasia M.D.   On: 12/03/2017 09:35   Dg Chest Port 1 View  Result Date: 12/02/2017 CLINICAL DATA:  Preop for sub xiphoid pericardial window. EXAM: PORTABLE CHEST 1 VIEW COMPARISON:  Chest x-ray from yesterday FINDINGS: There is a new presumably pericardial drain. Right IJ central line with tip at the upper cavoatrial junction. History of lung mass and treatment with volume loss and opacity at the right apex. Small pneumoperitoneum below the right diaphragm presumably from interval surgery. No history of abdominal pain. IMPRESSION: 1. Subxiphoid pericardial window with drain. Stable cardiopericardial size when accounting for lower volumes. 2. Interval small volume pneumoperitoneum. 3. New central line with tip at the upper cavoatrial junction. Electronically Signed   By: Monte Fantasia M.D.   On: 12/02/2017 10:44     ASSESSMENT/PLAN:  Malignant neoplasm of bronchus of right upper lobe [facility]) This is a very pleasant 62 year old white male with a stage IV non-small cell lung cancer, adenocarcinoma status post 6 cycles of induction systemic chemotherapy with carbo platinum, Alimta and Avastin with partial response. This was followed by maintenance treatment with Alimta and Avastin for 3 cycles and his treatment was discontinued secondary to development of cavitary pneumonia in the right upper lobe. He was on observation for several months followed by disease progression on imaging studies. The patient was a started on second line treatment with Nivolumab 480 mg IV every 4 weeks status  post 3 cycles discontinued secondary to disease progression.   The patient is now on treatment with Xalkori 250 mg p.o. twice daily as the patient has MET amplification his molecular studies.  Status post about 1 month of treatment.  He is tolerating this medication fairly well with no concerning complaints.  The patient was seen with Dr. Julien Nordmann.  Recommend that he continue Xalkori 250 mg twice a day.  The patient was reminded that he needs to keep his follow-up appointments as scheduled for safe monitoring of his Xalkori.  Patient states understanding of this.  The patient will follow-up in approximately 1 month with a restaging CT scan of the chest, abdomen, pelvis prior to that visit.  For pain management he will continue on MS Contin and oxycodone.  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  He was advised to call immediately if he has any concerning symptoms in the interval. The patient voices understanding of current  disease status and treatment options and is in agreement with the current care plan. All questions were answered. The patient knows to call the clinic with any problems, questions or concerns. We can certainly see the patient much sooner if necessary.   Orders Placed This Encounter  Procedures  . CT CHEST W CONTRAST    Standing Status:   Future    Standing Expiration Date:   12/30/2018    Order Specific Question:   If indicated for the ordered procedure, I authorize the administration of contrast media per Radiology protocol    Answer:   Yes    Order Specific Question:   Preferred imaging location?    Answer:   [facility]    Order Specific Question:   Radiology Contrast Protocol - do NOT remove file path    Answer:   [email or web address]    Order Specific Question:   Reason for Exam additional comments    Answer:   Lung cancer. Restaging.  Marland Kitchen CT ABDOMEN PELVIS W CONTRAST    Standing Status:   Future     Standing Expiration Date:   12/30/2018    Order Specific Question:   If indicated for the ordered procedure, I authorize the administration of contrast media per Radiology protocol    Answer:   Yes    Order Specific Question:   Preferred imaging location?    Answer:   [facility]    Order Specific Question:   Radiology Contrast Protocol - do NOT remove file path    Answer:   [email or web address]    Order Specific Question:   Reason for Exam additional comments    Answer:   Lung cancer. Restaging.  Marland Kitchen CBC with Differential (Cancer Center Only)    Standing Status:   Future    Standing Expiration Date:   12/30/2018  . Magnesium    Standing Status:   Future    Standing Expiration Date:   12/30/2018  . Phosphorus    Standing Status:   Future    Standing Expiration Date:   12/29/2018  . CMP ([place] only)    Standing Status:   Future    Standing Expiration Date:   12/30/2018    Mikey Bussing, DNP, AGPCNP-BC, AOCNP 12/29/17  ADDENDUM: Hematology/Oncology Attending: I had a face-to-face encounter with the patient today.  I recommended his care plan.  This is a very pleasant 62 years old white male with metastatic non-small cell lung cancer, adenocarcinoma status post several chemotherapy regimens as well as immunotherapy.  He was recently found to have evidence for disease progression and the patient was a started on Xalkori 250 mg p.o. twice daily based on the detection of MET amplification on molecular studies.  He is status post 1 months of treatment and has been tolerating this treatment well. I recommended for the patient to continue his current treatment and will have repeat CT scan of the chest, abdomen and pelvis in 1 months for restaging of his disease. He was advised to call immediately if he has any concerning symptoms in the interval.  Disclaimer: This note was dictated with voice recognition software. Similar sounding words can inadvertently be  transcribed and may be missed upon review. Eilleen Kempf, MD 12/29/17

## 2018-01-03 ENCOUNTER — Emergency Department (HOSPITAL_COMMUNITY): Payer: Medicaid Other

## 2018-01-03 ENCOUNTER — Encounter (HOSPITAL_COMMUNITY): Payer: Self-pay | Admitting: *Deleted

## 2018-01-03 ENCOUNTER — Other Ambulatory Visit: Payer: Self-pay

## 2018-01-03 ENCOUNTER — Emergency Department (HOSPITAL_COMMUNITY)
Admission: EM | Admit: 2018-01-03 | Discharge: 2018-01-03 | Disposition: A | Discharge status: Home / Self Care | Payer: Medicaid Other | Attending: Emergency Medicine | Admitting: Emergency Medicine

## 2018-01-03 DIAGNOSIS — Z87891 Personal history of nicotine dependence: Secondary | ICD-10-CM | POA: Insufficient documentation

## 2018-01-03 DIAGNOSIS — R4182 Altered mental status, unspecified: Secondary | ICD-10-CM

## 2018-01-03 DIAGNOSIS — C3490 Malignant neoplasm of unspecified part of unspecified bronchus or lung: Secondary | ICD-10-CM

## 2018-01-03 DIAGNOSIS — Z79899 Other long term (current) drug therapy: Secondary | ICD-10-CM | POA: Insufficient documentation

## 2018-01-03 LAB — COMPREHENSIVE METABOLIC PANEL
ALT: 15 U/L — ABNORMAL LOW (ref 17–63)
ANION GAP: 11 (ref 5–15)
AST: 49 U/L — ABNORMAL HIGH (ref 15–41)
Albumin: 2.6 g/dL — ABNORMAL LOW (ref 3.5–5.0)
Alkaline Phosphatase: 100 U/L (ref 38–126)
BUN: 7 mg/dL (ref 6–20)
CALCIUM: 8.1 mg/dL — AB (ref 8.9–10.3)
CHLORIDE: 105 mmol/L (ref 101–111)
CO2: 21 mmol/L — AB (ref 22–32)
Creatinine, Ser: 0.57 mg/dL — ABNORMAL LOW (ref 0.61–1.24)
GFR calc non Af Amer: >60 mL/min (ref >60)
Glucose, Bld: 92 mg/dL (ref 65–99)
Potassium: 4 mmol/L (ref 3.5–5.1)
SODIUM: 137 mmol/L (ref 135–145)
Total Bilirubin: 1 mg/dL (ref 0.3–1.2)
Total Protein: 6.5 g/dL (ref 6.5–8.1)

## 2018-01-03 LAB — CBC
HCT: 36.7 % — ABNORMAL LOW (ref 39.0–52.0)
HEMOGLOBIN: 11.5 g/dL — AB (ref 13.0–17.0)
MCH: 26 pg (ref 26.0–34.0)
MCHC: 31.3 g/dL (ref 30.0–36.0)
MCV: 82.8 fL (ref 78.0–100.0)
Platelets: 266 10*3/uL (ref 150–400)
RBC: 4.43 MIL/uL (ref 4.22–5.81)
RDW: 17.1 % — ABNORMAL HIGH (ref 11.5–15.5)
WBC: 6.3 10*3/uL (ref 4.0–10.5)

## 2018-01-03 LAB — CBG MONITORING, ED: Glucose-Capillary: 90 mg/dL (ref 65–99)

## 2018-01-03 MED ORDER — IOPAMIDOL (ISOVUE-300) INJECTION 61%
30.0000 mL | Freq: Once | INTRAVENOUS | Status: AC | PRN
  Administered 2018-01-03: 30 mL via ORAL

## 2018-01-03 MED ORDER — IOPAMIDOL (ISOVUE-300) INJECTION 61%
INTRAVENOUS | Status: AC
  Administered 2018-01-03: 30 mL via ORAL
  Filled 2018-01-03: qty 30

## 2018-01-03 MED ORDER — IOPAMIDOL (ISOVUE-300) INJECTION 61%
INTRAVENOUS | Status: AC
  Filled 2018-01-03: qty 100

## 2018-01-03 MED ORDER — IOPAMIDOL (ISOVUE-300) INJECTION 61%
100.0000 mL | Freq: Once | INTRAVENOUS | Status: AC | PRN
  Administered 2018-01-03: 100 mL via INTRAVENOUS

## 2018-01-03 NOTE — ED Provider Notes (Signed)
[facility] DEPT Provider Note   CSN: [id number] Arrival date & time: 01/03/18  1029     History   Chief Complaint Chief Complaint  Patient presents with  . Altered Mental Status    HPI Edward Spence is a 62 y.o. male.  HPI Patient is a 62 year old male who is brought the emergency department by his ex-wife after she noted that he had developed some abnormal behavior last night including abnormal speech pattern and what appeared to her to be acute confusion or intoxication and the fact that he had rummage through the family room and moved multiple piece of furniture as if he was looking for something.  She reports now he is return to baseline mental status.  He has known metastatic lung cancer with worsening metastases noted on his last CT scans in February 2019.  He is currently under the care of the oncology team here at [facility].  He denies headache at this time.  No neck pain or neck stiffness.  No recent fevers.  He states he feels fine now and is without significant symptoms.  His ex-wife reports normal behavior now.  He denies use of alcohol or drugs last night.   Past Medical History:  Diagnosis Date  . Anemia    received 2-3 transfusion while on chemo back in 2018  . Cavitary pneumonia 01/21/2017  . Constipation 07/23/2016  . Encounter for antineoplastic chemotherapy 06/24/2016  . Lung cancer ([place])   . Lung mass 05/19/2016  . Pneumonia    in college    Patient Active Problem List   Diagnosis Date Noted  . S/P pericardial surgery 12/02/2017  . 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 Dyspnea on exertion 11/18/2017  . Encounter for antineoplastic immunotherapy 08/25/2017  . Cavitary pneumonia 01/21/2017  . Neutropenia, drug-induced ([place]) 09/18/2016  . Constipation 07/23/2016  . Dehydration 07/13/2016  . Nausea without vomiting 07/13/2016  . Dysphagia 07/13/2016  . Rash 07/13/2016  . Antineoplastic chemotherapy induced  pancytopenia (CODE) ([place]) 07/13/2016  . Encounter for antineoplastic chemotherapy 06/24/2016  . Osseous metastasis ([place]) 06/13/2016  . Malignant neoplasm of bronchus of right upper lobe ([place]) 05/26/2016  . Lung mass 05/19/2016    Past Surgical History:  Procedure Laterality Date  . APPENDECTOMY    . SUBXYPHOID PERICARDIAL WINDOW N/A 12/02/2017   Procedure: SUBXYPHOID PERICARDIAL WINDOW;  Surgeon: [name], MD;  Location: [facility];  Service: Thoracic;  Laterality: N/A;  . TEE WITHOUT CARDIOVERSION N/A 12/02/2017   Procedure: TRANSESOPHAGEAL ECHOCARDIOGRAM (TEE);  Surgeon: [name], MD;  Location: [facility];  Service: Thoracic;  Laterality: N/A;  . VIDEO BRONCHOSCOPY Bilateral 02/27/2017   Procedure: VIDEO BRONCHOSCOPY WITH FLUORO;  Surgeon: Marshell Garfinkel, MD;  Location: [facility];  Service: Cardiopulmonary;  Laterality: Bilateral;  . VIDEO BRONCHOSCOPY WITH ENDOBRONCHIAL ULTRASOUND N/A 05/22/2016   Procedure: VIDEO BRONCHOSCOPY WITH ENDOBRONCHIAL ULTRASOUND;  Surgeon: [name], MD;  Location: [facility];  Service: Thoracic;  Laterality: N/A;        Home Medications    Prior to Admission medications   Medication Sig Start Date End Date Taking? Authorizing Provider  albuterol (PROVENTIL HFA;VENTOLIN HFA) 108 (90 Base) MCG/ACT inhaler Inhale 2 puffs into the lungs every 6 (six) hours as needed for wheezing or shortness of breath. 10/29/17  Yes Mannam, Praveen, MD  crizotinib (XALKORI) 250 MG capsule Take 1 capsule (250 mg total) by mouth 2 (two) times daily. 11/17/17  Yes Curt Bears, MD  morphine (MS  CONTIN) 60 MG 12 hr tablet Take 1 tablet (60 mg total) by mouth 3 (three) times daily. 12/10/17  Yes Curt Bears, MD  Nutritional Supplements (CARNATION INSTANT BREAKFAST) LIQD Take 1 Bottle by mouth 2 (two) times daily.   Yes [provider]  oxyCODONE (ROXICODONE) 15 MG immediate release tablet Take 1 tablet (15 mg total) by mouth every 4 (four) hours  as needed. 12/10/17  Yes Curt Bears, MD  Respiratory Therapy Supplies (FLUTTER) [place] USE AS DIRECTED 05/28/17  Yes Marshell Garfinkel, MD    Family History Family History  Problem Relation Age of Onset  . Cancer Mother   . COPD Father   . Heart failure Father     Social History Social History   Tobacco Use  . Smoking status: Former Smoker    Packs/day: 1.00    Years: 40.00    Pack years: 40.00    Types: Cigarettes    Last attempt to quit: 04/29/2016    Years since quitting: 1.6  . Smokeless tobacco: Never Used  . Tobacco comment: 1-5 per day  Substance Use Topics  . Alcohol use: No  . Drug use: No     Allergies   Penicillins   Review of Systems Review of Systems  All other systems reviewed and are negative.    Physical Exam Updated Vital Signs BP (!) 95/59 (BP Location: Left Arm)   Pulse 89   Temp 98.7 F (37.1 C) (Oral)   Resp 15   Ht 6\' 3"  (1.905 m)   Wt 74.8 kg (165 lb)   SpO2 95%   BMI 20.62 kg/m     Physical Exam  Constitutional: He is oriented to person, place, and time. He appears well-developed and well-nourished.  HENT:  Head: Normocephalic and atraumatic.  Eyes: EOM are normal.  Neck: Normal range of motion.  Cardiovascular: Normal rate, regular rhythm, normal heart sounds and intact distal pulses.  Pulmonary/Chest: Effort normal and breath sounds normal. No respiratory distress.  Abdominal: Soft. He exhibits no distension. There is no tenderness.  Musculoskeletal: Normal range of motion.  Neurological: He is alert and oriented to person, place, and time.  Skin: Skin is warm and dry.  Psychiatric: He has a normal mood and affect. Judgment normal.  Nursing note and vitals reviewed.    ED Treatments / Results  Labs (all labs ordered are listed, but only abnormal results are displayed) Labs Reviewed  COMPREHENSIVE METABOLIC PANEL - Abnormal; Notable for the following components:      Result Value   CO2 21 (*)    Creatinine, Ser  0.57 (*)    Calcium 8.1 (*)    Albumin 2.6 (*)    AST 49 (*)    ALT 15 (*)    All other components within normal limits  CBC - Abnormal; Notable for the following components:   Hemoglobin 11.5 (*)    HCT 36.7 (*)    RDW 17.1 (*)    All other components within normal limits  RAPID URINE DRUG SCREEN, HOSP PERFORMED  CBG MONITORING, ED    EKG None  Radiology Ct Head W Or Wo Contrast  Result Date: 01/03/2018 CLINICAL DATA:  Mental status changes beginning last night. Abnormal behavior today. History of lung cancer. EXAM: CT HEAD WITHOUT AND WITH CONTRAST TECHNIQUE: Contiguous axial images were obtained from the base of the skull through the vertex without and with intravenous contrast CONTRAST:  117mL ISOVUE-300 IOPAMIDOL (ISOVUE-300) INJECTION 61% COMPARISON:  None. FINDINGS: Brain: The brain  shows a normal appearance without evidence of malformation, atrophy, old or acute small or large vessel infarction, mass lesion, hemorrhage, hydrocephalus or extra-axial collection. After contrast administration, no abnormal enhancement occurs. Vascular: Mild atherosclerotic calcification of the major vessels at the base of the brain. Skull: Normal.  No traumatic finding.  No focal bone lesion. Sinuses/Orbits: Sinuses are clear. Orbits appear normal. Mastoids are clear. Other: None significant IMPRESSION: Normal CT for age. No focal brain insult. No evidence of metastatic disease. Electronically Signed   By: Nelson Chimes M.D.   On: 01/03/2018 14:18   Ct Chest W Contrast  Result Date: 01/03/2018 CLINICAL DATA:  62 year old presenting with delirium. Current history of metastatic RIGHT upper lobe non-small cell lung cancer. Recent pericardial window 12/02/2017 due to a malignant pericardial effusion. Restaging. EXAM: CT CHEST, ABDOMEN, AND PELVIS WITH CONTRAST TECHNIQUE: Multidetector CT imaging of the chest, abdomen and pelvis was performed following the standard protocol during bolus administration of  intravenous contrast. CONTRAST:  144mL ISOVUE-300 IOPAMIDOL INJECTION 61% IV. Oral contrast was also administered. COMPARISON:  11/16/2017, 08/06/2017 dating back to 09/01/2016. PET-CT 05/29/2016. FINDINGS: CT CHEST FINDINGS Cardiovascular: Interval significant decrease in size of the pericardial effusion since the most recent CT 2 months ago after pericardial window. A small pericardial effusion persists. Heart size normal. Mild LAD coronary atherosclerosis. Mild atherosclerosis involving the thoracic and UPPER abdominal aorta without evidence of aneurysm. Interval development of marked narrowing of the superior vena cava and the central portions of the RIGHT and LEFT subclavian veins due to enlarging mediastinal lymph nodes (detailed below). As a result, there are transthoracic collaterals bilaterally, with collateral veins extending into the neck. Mediastinum/Nodes: Since the CT 2 months ago, marked progression of mediastinal and LEFT hilar lymphadenopathy. Conglomerate RIGHT UPPER paratracheal nodes (station 2R) which are contiguous with tumor in the MEDIAL RIGHT upper lobe now have a maximum measurement of approximately 6.4 x 4.4 cm (previously 5.8 x 4.0 cm). Necrotic subcarinal lymph nodes now measure approximately 2.0 x 3.5 cm (previously 2.8 x 1.5 cm. Conglomerate LEFT hilar lymph nodes now measure approximately 2.3 x 3.2 cm (previously 1.7 x 2.4 cm). No pathologic axillary lymphadenopathy. Oral contrast material within the normal appearing esophagus. Normal-appearing thyroid gland. Lungs/Pleura: Interval enlargement of the previously identified pleural based nodule in the LEFT lower lobe (series 7, image 111), now measuring approximately 1.5 x 2.3 cm (previously 0.8 x 1.5 cm), now with extension into the chest wall. Stable 6 mm nodule involving the inferior RIGHT upper lobe (series 7, image 88). Stable nodule in the ANTERIOR aspect of the RIGHT lower lobe (series 7, image 126). Interval enlargement of still  small satellite nodules superiorly in the RIGHT upper lobe, near the site of the original primary tumor. Post radiation fibrosis and bronchiectasis involving the RIGHT upper lobe. 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 Atelectasis involving the LEFT lower lobe related to near complete obstruction of the LEFT lower lobe bronchus by fluid. Small RIGHT pleural effusion and associated passive atelectasis in the RIGHT lower lobe. Musculoskeletal: Degenerative disc disease and spondylosis involving the visualized LOWER cervical spine. Minimal degenerative disc disease and spondylosis involving the mid and LOWER thoracic spine. Stable lytic lesion in the LEFT lamina and pedicle of T11. No evidence of new osseous metastatic disease. CT ABDOMEN PELVIS FINDINGS Hepatobiliary: Since the CT 2 months ago, marked progression metastatic disease to the liver, with innumerable masses currently. The index lesion measured  previously in the MEDIAL segment LEFT lobe now measures approximately 4.8 x 3.6 cm (previously 2.4 x 2.3 cm). Mild hepatomegaly, with interval slight increase in size. Gallbladder normal in appearance without calcified gallstones. No biliary ductal dilation. Pancreas: Normal in appearance without evidence of mass, ductal dilation, or inflammation. Spleen: Normal in size and appearance. Adrenals/Urinary Tract: Normal appearing adrenal glands. Benign cyst cysts arising from the LOWER pole of the LEFT kidney posteriorly and the LATERAL aspect of the mid RIGHT kidney, unchanged. No significant abnormality involving either kidney. No urinary tract calculi. No hydronephrosis. Normal appearing mildly distended urinary bladder. Stomach/Bowel: Stomach normal in appearance for the degree of distention. Normal-appearing small bowel. Internal hernia suspected, as there are small bowel loops in the LEFT mid abdomen which are LATERAL to  the descending colon. Expected stool burden in the colon which is relatively decompressed throughout and normal in appearance. Appendix surgically absent. Vascular/Lymphatic: Severe aorto-iliofemoral atherosclerosis without evidence of aneurysm. Enlarging lymph nodes in the retroperitoneum, an index LEFT periaortic node measuring approximately 1.7 x 1.8 cm (previously 1.0 x 1.5 cm). Reproductive: Prostate gland and seminal vesicles normal in size and appearance for age. Other: None. Musculoskeletal: Mixed lytic and sclerotic metastasis in the L5 vertebral body, unchanged. No new osseous metastatic disease. Facet degenerative changes involving the LOWER lumbar spine. IMPRESSION: Since the most recent prior CT examination 11/16/2017: 1. Marked progression of metastatic lung cancer with enlarging mediastinal and LEFT hilar lymph nodes, enlarging BILATERAL lung metastases, innumerable liver metastases, and enlarging retroperitoneal lymph nodes in the abdomen. 2. Further compression of the superior vena cava from the RIGHT UPPER paratracheal mediastinal metastatic disease. 3. Mucous plugging in the LEFT lower lobe bronchus and resulting postobstructive atelectasis in the LEFT lower lobe. 4. Small RIGHT pleural effusion and mild passive atelectasis in the RIGHT lower lobe. 5. 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 The pleural air is contained and there is no evidence of pneumothorax elsewhere. 6. Stable L5 mixed lytic and sclerotic metastasis. Stable lytic metastasis involving the lamina and pedicle of T11. No new osseous metastatic disease. 7.  Aortic Atherosclerosis (ICD10-170.0) Electronically Signed   By: Evangeline Dakin M.D.   On: 01/03/2018 14:50   Ct Abdomen Pelvis W Contrast  Result Date: 01/03/2018 CLINICAL DATA:  62 year old presenting with delirium. Current history of metastatic RIGHT upper lobe non-small cell lung cancer. Recent pericardial window 12/02/2017 due to a malignant  pericardial effusion. Restaging. EXAM: CT CHEST, ABDOMEN, AND PELVIS WITH CONTRAST TECHNIQUE: Multidetector CT imaging of the chest, abdomen and pelvis was performed following the standard protocol during bolus administration of intravenous contrast. CONTRAST:  161mL ISOVUE-300 IOPAMIDOL INJECTION 61% IV. Oral contrast was also administered. COMPARISON:  11/16/2017, 08/06/2017 dating back to 09/01/2016. PET-CT 05/29/2016. FINDINGS: CT CHEST FINDINGS Cardiovascular: Interval significant decrease in size of the pericardial effusion since the most recent CT 2 months ago after pericardial window. A small pericardial effusion persists. Heart size normal. Mild LAD coronary atherosclerosis. Mild atherosclerosis involving the thoracic and UPPER abdominal aorta without evidence of aneurysm. Interval development of marked narrowing of the superior vena cava and the central portions of the RIGHT and LEFT subclavian veins due to enlarging mediastinal lymph nodes (detailed below). As a result, there are transthoracic collaterals bilaterally, with collateral veins extending into the neck. Mediastinum/Nodes: Since the CT 2 months ago, marked progression of mediastinal and LEFT hilar lymphadenopathy. Conglomerate RIGHT UPPER paratracheal nodes (station 2R) which are contiguous with tumor in the MEDIAL RIGHT upper lobe now have  a maximum measurement of approximately 6.4 x 4.4 cm (previously 5.8 x 4.0 cm). Necrotic subcarinal lymph nodes now measure approximately 2.0 x 3.5 cm (previously 2.8 x 1.5 cm. Conglomerate LEFT hilar lymph nodes now measure approximately 2.3 x 3.2 cm (previously 1.7 x 2.4 cm). No pathologic axillary lymphadenopathy. Oral contrast material within the normal appearing esophagus. Normal-appearing thyroid gland. Lungs/Pleura: Interval enlargement of the previously identified pleural based nodule in the LEFT lower lobe (series 7, image 111), now measuring approximately 1.5 x 2.3 cm (previously 0.8 x 1.5 cm), now  with extension into the chest wall. Stable 6 mm nodule involving the inferior RIGHT upper lobe (series 7, image 88). Stable nodule in the ANTERIOR aspect of the RIGHT lower lobe (series 7, image 126). Interval enlargement of still small satellite nodules superiorly in the RIGHT upper lobe, near the site of the original primary tumor. Post radiation fibrosis and bronchiectasis involving the RIGHT upper lobe. 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 Atelectasis involving the LEFT lower lobe related to near complete obstruction of the LEFT lower lobe bronchus by fluid. Small RIGHT pleural effusion and associated passive atelectasis in the RIGHT lower lobe. Musculoskeletal: Degenerative disc disease and spondylosis involving the visualized LOWER cervical spine. Minimal degenerative disc disease and spondylosis involving the mid and LOWER thoracic spine. Stable lytic lesion in the LEFT lamina and pedicle of T11. No evidence of new osseous metastatic disease. CT ABDOMEN PELVIS FINDINGS Hepatobiliary: Since the CT 2 months ago, marked progression metastatic disease to the liver, with innumerable masses currently. The index lesion measured previously in the MEDIAL segment LEFT lobe now measures approximately 4.8 x 3.6 cm (previously 2.4 x 2.3 cm). Mild hepatomegaly, with interval slight increase in size. Gallbladder normal in appearance without calcified gallstones. No biliary ductal dilation. Pancreas: Normal in appearance without evidence of mass, ductal dilation, or inflammation. Spleen: Normal in size and appearance. Adrenals/Urinary Tract: Normal appearing adrenal glands. Benign cyst cysts arising from the LOWER pole of the LEFT kidney posteriorly and the LATERAL aspect of the mid RIGHT kidney, unchanged. No significant abnormality involving either kidney. No urinary tract calculi. No hydronephrosis. Normal appearing mildly  distended urinary bladder. Stomach/Bowel: Stomach normal in appearance for the degree of distention. Normal-appearing small bowel. Internal hernia suspected, as there are small bowel loops in the LEFT mid abdomen which are LATERAL to the descending colon. Expected stool burden in the colon which is relatively decompressed throughout and normal in appearance. Appendix surgically absent. Vascular/Lymphatic: Severe aorto-iliofemoral atherosclerosis without evidence of aneurysm. Enlarging lymph nodes in the retroperitoneum, an index LEFT periaortic node measuring approximately 1.7 x 1.8 cm (previously 1.0 x 1.5 cm). Reproductive: Prostate gland and seminal vesicles normal in size and appearance for age. Other: None. Musculoskeletal: Mixed lytic and sclerotic metastasis in the L5 vertebral body, unchanged. No new osseous metastatic disease. Facet degenerative changes involving the LOWER lumbar spine. IMPRESSION: Since the most recent prior CT examination 11/16/2017: 1. Marked progression of metastatic lung cancer with enlarging mediastinal and LEFT hilar lymph nodes, enlarging BILATERAL lung metastases, innumerable liver metastases, and enlarging retroperitoneal lymph nodes in the abdomen. 2. Further compression of the superior vena cava from the RIGHT UPPER paratracheal mediastinal metastatic disease. 3. Mucous plugging in the LEFT lower lobe bronchus and resulting postobstructive atelectasis in the LEFT lower lobe. 4. Small RIGHT pleural effusion and mild passive atelectasis in the RIGHT lower lobe. 5. 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 The pleural air is contained and there is no evidence of pneumothorax elsewhere. 6. Stable L5 mixed lytic and sclerotic metastasis. Stable lytic metastasis involving the lamina and pedicle of T11. No new osseous metastatic disease. 7.  Aortic Atherosclerosis (ICD10-170.0) Electronically Signed   By: Evangeline Dakin M.D.   On: 01/03/2018 14:50     Procedures Procedures (including critical care time)  Medications Ordered in ED Medications  iopamidol (ISOVUE-300) 61 % injection (has no administration in time range)  iopamidol (ISOVUE-300) 61 % injection 30 mL (30 mLs Oral Contrast Given 01/03/18 1239)  iopamidol (ISOVUE-300) 61 % injection 100 mL (100 mLs Intravenous Contrast Given 01/03/18 1344)     Initial Impression / Assessment and Plan / ED Course  I have reviewed the triage vital signs and the nursing notes.  Pertinent labs & imaging results that were available during my care of the patient were reviewed by me and considered in my medical decision making (see chart for details).    No clear etiology for patient's altered mental status.  CT imaging of the head chest abdomen and pelvis today demonstrates no brain metastases but does demonstrate evidence of worsening progression of his metastatic lung cancer.  Patient will have an ongoing discussion regarding additional therapy with his oncologist.  Overall well-appearing.  Return to baseline mental status at this time.  No indication for admission or additional testing at this time.  No meningeal signs.  Doubt acute encephalitis.  Patient and family understand return to the emergency department for new or worsening symptoms  Final Clinical Impressions(s) / ED Diagnoses   Final diagnoses:  Altered mental status, unspecified altered mental status type  Primary malignant neoplasm of lung metastatic to other site, unspecified laterality [facility])    ED Discharge Orders    None       Jola Schmidt, MD 01/03/18 [phone]

## 2018-01-03 NOTE — ED Triage Notes (Signed)
Wife states pt was not understanding things correctly last night, he would answer with odd answers, this am she woke up to find her living room destroyed, he had been crawling on hands and knees talking to the dog. Pt is presently alert and oriented. Recently had fluid removed due to his cancer, wife states "removed fluid from his heart"

## 2018-01-04 ENCOUNTER — Telehealth: Payer: Self-pay | Admitting: Pulmonary Disease

## 2018-01-04 MED ORDER — ALBUTEROL SULFATE HFA 108 (90 BASE) MCG/ACT IN AERS: 2.0000 | INHALATION_SPRAY | Freq: Four times a day (QID) | RESPIRATORY_TRACT | 0 refills | Status: AC | PRN

## 2018-01-04 NOTE — Telephone Encounter (Signed)
Spoke with the pt to verify the msg  Rx for ventolin was refilled  Appt scheduled for f/u since he was overdue

## 2018-01-07 ENCOUNTER — Ambulatory Visit: Payer: Medicaid Other | Admitting: Pulmonary Disease

## 2018-01-11 ENCOUNTER — Telehealth: Payer: Self-pay | Admitting: Medical Oncology

## 2018-01-11 ENCOUNTER — Telehealth: Payer: Self-pay | Admitting: *Deleted

## 2018-01-11 ENCOUNTER — Other Ambulatory Visit: Payer: Self-pay | Admitting: Medical Oncology

## 2018-01-11 DIAGNOSIS — J189 Pneumonia, unspecified organism: Secondary | ICD-10-CM

## 2018-01-11 DIAGNOSIS — Z5111 Encounter for antineoplastic chemotherapy: Secondary | ICD-10-CM

## 2018-01-11 DIAGNOSIS — C3411 Malignant neoplasm of upper lobe, right bronchus or lung: Secondary | ICD-10-CM

## 2018-01-11 DIAGNOSIS — J984 Other disorders of lung: Secondary | ICD-10-CM

## 2018-01-11 MED ORDER — MORPHINE SULFATE ER 60 MG PO TBCR: 60.0000 mg | EXTENDED_RELEASE_TABLET | Freq: Three times a day (TID) | ORAL | 0 refills | Status: DC

## 2018-01-11 MED ORDER — OXYCODONE HCL 15 MG PO TABS: 15.0000 mg | ORAL_TABLET | ORAL | 0 refills | Status: AC | PRN

## 2018-01-11 NOTE — Telephone Encounter (Signed)
Refill and appt needed.

## 2018-01-11 NOTE — Telephone Encounter (Signed)
"  Derryck came to ED, CT scans need advice on schedule, Morphine and Oxycodone."

## 2018-01-11 NOTE — Telephone Encounter (Signed)
I left Edward Spence a vm about appt and to call me back .

## 2018-01-12 ENCOUNTER — Other Ambulatory Visit: Payer: Medicaid Other

## 2018-01-12 ENCOUNTER — Ambulatory Visit: Payer: Medicaid Other

## 2018-01-12 ENCOUNTER — Ambulatory Visit: Payer: Medicaid Other | Admitting: Internal Medicine

## 2018-01-13 ENCOUNTER — Other Ambulatory Visit: Payer: Self-pay | Admitting: Internal Medicine

## 2018-01-13 ENCOUNTER — Telehealth: Payer: Self-pay | Admitting: Internal Medicine

## 2018-01-13 ENCOUNTER — Inpatient Hospital Stay (HOSPITAL_BASED_OUTPATIENT_CLINIC_OR_DEPARTMENT_OTHER): Payer: Medicaid Other | Admitting: Oncology

## 2018-01-13 VITALS — BP 84/49 | HR 112 | Temp 97.8°F | Resp 16 | Ht 75.0 in | Wt 148.8 lb

## 2018-01-13 DIAGNOSIS — E43 Unspecified severe protein-calorie malnutrition: Secondary | ICD-10-CM

## 2018-01-13 DIAGNOSIS — J90 Pleural effusion, not elsewhere classified: Secondary | ICD-10-CM | POA: Diagnosis not present

## 2018-01-13 DIAGNOSIS — Z5111 Encounter for antineoplastic chemotherapy: Secondary | ICD-10-CM

## 2018-01-13 DIAGNOSIS — Z7189 Other specified counseling: Secondary | ICD-10-CM

## 2018-01-13 DIAGNOSIS — R52 Pain, unspecified: Secondary | ICD-10-CM

## 2018-01-13 DIAGNOSIS — C3411 Malignant neoplasm of upper lobe, right bronchus or lung: Secondary | ICD-10-CM

## 2018-01-13 DIAGNOSIS — R630 Anorexia: Secondary | ICD-10-CM | POA: Diagnosis not present

## 2018-01-13 DIAGNOSIS — C7951 Secondary malignant neoplasm of bone: Secondary | ICD-10-CM

## 2018-01-13 DIAGNOSIS — R634 Abnormal weight loss: Secondary | ICD-10-CM

## 2018-01-13 MED ORDER — PROCHLORPERAZINE MALEATE 10 MG PO TABS: 10.0000 mg | ORAL_TABLET | Freq: Four times a day (QID) | ORAL | 1 refills | Status: AC | PRN

## 2018-01-13 MED ORDER — DEXAMETHASONE 4 MG PO TABS: ORAL_TABLET | ORAL | 0 refills | Status: AC

## 2018-01-13 NOTE — Progress Notes (Signed)
[place], FNP [address] [place] [id number]  DIAGNOSIS: Stage IV (T3, N3, M1b) non-small cell lung cancer, adenocarcinoma diagnosed in August 2017 and presented with right upper lobe lung nodule as well as large right superior mediastinal and right paratracheal lymphadenopathy and bone metastases in the right iliac and L5 vertebral body.  GUARDANT 360: Negative for EGFR, ALK, ROS 1, BRAF mutations but showed amplification of EGFR and MET  PRIOR THERAPY: 1) Palliative systemic chemotherapy to the right superior mediastinal and right paratracheal lymphadenopathy under the care of Dr. Lisbeth Renshaw. 2) Systemic chemotherapy with carboplatin for AUC of 5, Alimta 500 MG/M2 and Avastin 15 MG/KG every 3 weeks is status post 6 cycles. First cycle was given on 07/28/2016.  3) Maintenance systemic chemotherapy with Alimta 500 MG/M2 and Avastin 15 MG/KG every 3 weeks. First dose 11/19/2016. Status post 3 cycles. Avastin was discontinued after cycle #3 secondary to development of right upper lobe cavitary lesion. Treatment is currently on hold secondary to the cavitary lesion in the right upper lobe. 4)palliative radiotherapy to metastatic bone disease. 5)Second line treatment with immunotherapy with Nivolumab 480 mg IV every 4 weeks. First cycle August 25, 2017. Status post 3cycles.This was discontinued today secondary to disease progression. 6) Xalkori 250 mg p.o. twice daily for radiation with non-small cell lung cancer andMETamplification.  First dose started on approximately 11/23/2017.  Status post 6 weeks of treatment.  Discontinued secondary disease progression.  CURRENT THERAPY: Taxotere 75 mg/m with Cyramza 10 mg/kg given every 3 weeks.  First dose expected on 01/20/2018.  INTERVAL HISTORY: Edward Spence 62 y.o. male returns for routine follow-up visit accompanied by his ex-wife.  The patient was recently seen in the emergency room  due to altered mental status.  His ex-wife thinks that he was taking too much pain medication.  She is now controlling the administration of his pain medication.  He has not had any recurrent episodes of altered mental status.  During his ER visit, he had CT scans of the head, chest, abdomen and pelvis.  Scans show disease progression.  He has a decreased appetite and has lost about 17 pounds since his last visit.  He is taking protein supplements.  He denies fevers and chills.  Denies chest pain, shortness of breath, cough, hemoptysis.  Denies nausea, vomiting, constipation, diarrhea.  Denies visual disturbances.  Pain is overall controlled with his current pain medications.  The patient is here to discuss his CT scan results and treatment options.  MEDICAL HISTORY: Past Medical History:  Diagnosis Date  . Anemia    received 2-3 transfusion while on chemo back in 2018  . Cavitary pneumonia 01/21/2017  . Constipation 07/23/2016  . Encounter for antineoplastic chemotherapy 06/24/2016  . Lung cancer ([facility])   . Lung mass 05/19/2016  . Pneumonia    in college    ALLERGIES:  is allergic to penicillins.  MEDICATIONS:  Current Outpatient Medications  Medication Sig Dispense Refill  . albuterol (PROVENTIL HFA;VENTOLIN HFA) 108 (90 Base) MCG/ACT inhaler Inhale 2 puffs into the lungs every 6 (six) hours as needed for wheezing or shortness of breath. 1 Inhaler 0  . crizotinib (XALKORI) 250 MG capsule Take 1 capsule (250 mg total) by mouth 2 (two) times daily. 60 capsule 2  . dexamethasone (DECADRON) 4 MG tablet Take 2 tabs twice a day the day before, day of, and day after each cycle of chemo. 30 tablet 0  . morphine (  MS CONTIN) 60 MG 12 hr tablet Take 1 tablet (60 mg total) by mouth 3 (three) times daily. 90 tablet 0  . Nutritional Supplements (CARNATION INSTANT BREAKFAST) LIQD Take 1 Bottle by mouth 2 (two) times daily.    Marland Kitchen oxyCODONE (ROXICODONE) 15 MG immediate release tablet Take 1 tablet (15 mg  total) by mouth every 4 (four) hours as needed. 120 tablet 0  . prochlorperazine (COMPAZINE) 10 MG tablet Take 1 tablet (10 mg total) by mouth every 6 (six) hours as needed for nausea or vomiting. 30 tablet 1  . Respiratory Therapy Supplies (FLUTTER) DEVI USE AS DIRECTED 1 each 0   No current facility-administered medications for this visit.     SURGICAL HISTORY:  Past Surgical History:  Procedure Laterality Date  . APPENDECTOMY    . SUBXYPHOID PERICARDIAL WINDOW N/A 12/02/2017   Procedure: SUBXYPHOID PERICARDIAL WINDOW;  Surgeon: [name], MD;  Location: [place];  Service: Thoracic;  Laterality: N/A;  . TEE WITHOUT CARDIOVERSION N/A 12/02/2017   Procedure: TRANSESOPHAGEAL ECHOCARDIOGRAM (TEE);  Surgeon: [name], MD;  Location: [place];  Service: Thoracic;  Laterality: N/A;  . VIDEO BRONCHOSCOPY Bilateral 02/27/2017   Procedure: VIDEO BRONCHOSCOPY WITH FLUORO;  Surgeon: Marshell Garfinkel, MD;  Location: [facility];  Service: Cardiopulmonary;  Laterality: Bilateral;  . VIDEO BRONCHOSCOPY WITH ENDOBRONCHIAL ULTRASOUND N/A 05/22/2016   Procedure: VIDEO BRONCHOSCOPY WITH ENDOBRONCHIAL ULTRASOUND;  Surgeon: [name], MD;  Location: [facility];  Service: Thoracic;  Laterality: N/A;    REVIEW OF SYSTEMS:   Review of Systems  Constitutional: Negative for chills, fever. Positive for fatigue, decreased appetite, and weight loss. HENT:   Negative for mouth sores, nosebleeds, sore throat and trouble swallowing.   Eyes: Negative for eye problems and icterus.  Respiratory: Negative for cough, hemoptysis, shortness of breath and wheezing.   Cardiovascular: Negative for chest pain and leg swelling.  Gastrointestinal: Negative for abdominal pain, constipation, diarrhea, nausea and vomiting.  Genitourinary: Negative for bladder incontinence, difficulty urinating, dysuria, frequency and hematuria.   Musculoskeletal: Positive for back and hip pain.  Skin: Negative for itching and  rash.  Neurological: Negative for dizziness, extremity weakness, headaches, light-headedness and seizures.  Hematological: Negative for adenopathy. Does not bruise/bleed easily.  Psychiatric/Behavioral: Negative for confusion, depression and sleep disturbance. The patient is not nervous/anxious.     PHYSICAL EXAMINATION:  Blood pressure (!) 84/49, pulse (!) 112, temperature 97.8 F (36.6 C), temperature source Oral, resp. rate 16, height 6' 3"  (1.905 m), weight 148 lb 12.8 oz (67.5 kg), SpO2 96 %.  ECOG PERFORMANCE STATUS: 1 - Symptomatic but completely ambulatory  Physical Exam  Constitutional: Oriented to person, place, and time. No distress.  HENT:  Head: Normocephalic and atraumatic.  Mouth/Throat: Oropharynx is clear and moist. No oropharyngeal exudate.  Eyes: Conjunctivae are normal. Right eye exhibits no discharge. Left eye exhibits no discharge. No scleral icterus.  Neck: Normal range of motion. Neck supple.  Cardiovascular: Normal rate, regular rhythm, normal heart sounds and intact distal pulses.   Pulmonary/Chest: Effort normal and breath sounds normal. No respiratory distress. No wheezes. No rales.  Abdominal: Soft. Bowel sounds are normal. Exhibits no distension and no mass. There is no tenderness.  Musculoskeletal: Normal range of motion. Exhibits no edema.  Lymphadenopathy:    No cervical adenopathy.  Neurological: Alert and oriented to person, place, and time. Exhibits normal muscle tone. Coordination normal.  Skin: Skin is warm and dry. No rash noted. Not diaphoretic. No erythema. No pallor.  Psychiatric: Mood, memory and judgment normal.  Vitals reviewed.  LABORATORY DATA: Lab Results  Component Value Date   WBC 6.3 01/03/2018   HGB 11.5 (L) 01/03/2018   HCT 36.7 (L) 01/03/2018   MCV 82.8 01/03/2018   PLT 266 01/03/2018      Chemistry      Component Value Date/Time   NA 137 01/03/2018 1105   NA 139 09/24/2017 0930   K 4.0 01/03/2018 1105   K 3.7  09/24/2017 0930   CL 105 01/03/2018 1105   CO2 21 (L) 01/03/2018 1105   CO2 24 09/24/2017 0930   BUN 7 01/03/2018 1105   BUN 7.6 09/24/2017 0930   CREATININE 0.57 (L) 01/03/2018 1105   CREATININE 0.70 12/29/2017 0955   CREATININE 0.7 09/24/2017 0930      Component Value Date/Time   CALCIUM 8.1 (L) 01/03/2018 1105   CALCIUM 9.2 09/24/2017 0930   ALKPHOS 100 01/03/2018 1105   ALKPHOS 63 09/24/2017 0930   AST 49 (H) 01/03/2018 1105   AST 40 (H) 12/29/2017 0955   AST 11 09/24/2017 0930   ALT 15 (L) 01/03/2018 1105   ALT 13 12/29/2017 0955   ALT 6 09/24/2017 0930   BILITOT 1.0 01/03/2018 1105   BILITOT 0.4 12/29/2017 0955   BILITOT 0.36 09/24/2017 0930       RADIOGRAPHIC STUDIES:  Dg Chest 2 View  Result Date: 12/22/2017 CLINICAL DATA:  Malignant pericardial effusion. Non-small-cell lung cancer. EXAM: CHEST - 2 VIEW COMPARISON:  Chest radiographs 12/07/2017 and 11/2017. Chest CT 11/16/2017. FINDINGS: The heart size is normal. There is stable superior retraction of the right hilum secondary to a cavitary right upper lobe process with volume loss. Pulmonary nodularity seen on CT is not well visualized. There is no new airspace disease, pleural effusion or pneumothorax. Old rib fracture noted on the left. IMPRESSION: Radiographically stable appearance of the chest with right upper lobe volume loss and cavitation. No change in the size of the cardiac silhouette. Electronically Signed   By: Richardean Sale M.D.   On: 12/22/2017 16:56   Ct Head W Or Wo Contrast  Result Date: 01/03/2018 CLINICAL DATA:  Mental status changes beginning last night. Abnormal behavior today. History of lung cancer. EXAM: CT HEAD WITHOUT AND WITH CONTRAST TECHNIQUE: Contiguous axial images were obtained from the base of the skull through the vertex without and with intravenous contrast CONTRAST:  153m ISOVUE-300 IOPAMIDOL (ISOVUE-300) INJECTION 61% COMPARISON:  None. FINDINGS: Brain: The brain shows a normal  appearance without evidence of malformation, atrophy, old or acute small or large vessel infarction, mass lesion, hemorrhage, hydrocephalus or extra-axial collection. After contrast administration, no abnormal enhancement occurs. Vascular: Mild atherosclerotic calcification of the major vessels at the base of the brain. Skull: Normal.  No traumatic finding.  No focal bone lesion. Sinuses/Orbits: Sinuses are clear. Orbits appear normal. Mastoids are clear. Other: None significant IMPRESSION: Normal CT for age. No focal brain insult. No evidence of metastatic disease. Electronically Signed   By: MNelson ChimesM.D.   On: 01/03/2018 14:18   Ct Chest W Contrast  Result Date: 01/03/2018 CLINICAL DATA:  62year old presenting with delirium. Current history of metastatic RIGHT upper lobe non-small cell lung cancer. Recent pericardial window 12/02/2017 due to a malignant pericardial effusion. Restaging. EXAM: CT CHEST, ABDOMEN, AND PELVIS WITH CONTRAST TECHNIQUE: Multidetector CT imaging of the chest, abdomen and pelvis was performed following the standard protocol during bolus administration of intravenous contrast. CONTRAST:  1052mISOVUE-300 IOPAMIDOL INJECTION 61% IV.  Oral contrast was also administered. COMPARISON:  11/16/2017, 08/06/2017 dating back to 09/01/2016. PET-CT 05/29/2016. FINDINGS: CT CHEST FINDINGS Cardiovascular: Interval significant decrease in size of the pericardial effusion since the most recent CT 2 months ago after pericardial window. A small pericardial effusion persists. Heart size normal. Mild LAD coronary atherosclerosis. Mild atherosclerosis involving the thoracic and UPPER abdominal aorta without evidence of aneurysm. Interval development of marked narrowing of the superior vena cava and the central portions of the RIGHT and LEFT subclavian veins due to enlarging mediastinal lymph nodes (detailed below). As a result, there are transthoracic collaterals bilaterally, with collateral veins  extending into the neck. Mediastinum/Nodes: Since the CT 2 months ago, marked progression of mediastinal and LEFT hilar lymphadenopathy. Conglomerate RIGHT UPPER paratracheal nodes (station 2R) which are contiguous with tumor in the MEDIAL RIGHT upper lobe now have a maximum measurement of approximately 6.4 x 4.4 cm (previously 5.8 x 4.0 cm). Necrotic subcarinal lymph nodes now measure approximately 2.0 x 3.5 cm (previously 2.8 x 1.5 cm. Conglomerate LEFT hilar lymph nodes now measure approximately 2.3 x 3.2 cm (previously 1.7 x 2.4 cm). No pathologic axillary lymphadenopathy. Oral contrast material within the normal appearing esophagus. Normal-appearing thyroid gland. Lungs/Pleura: Interval enlargement of the previously identified pleural based nodule in the LEFT lower lobe (series 7, image 111), now measuring approximately 1.5 x 2.3 cm (previously 0.8 x 1.5 cm), now with extension into the chest wall. Stable 6 mm nodule involving the inferior RIGHT upper lobe (series 7, image 88). Stable nodule in the ANTERIOR aspect of the RIGHT lower lobe (series 7, image 126). Interval enlargement of still small satellite nodules superiorly in the RIGHT upper lobe, near the site of the original primary tumor. Post radiation fibrosis and bronchiectasis involving the RIGHT upper lobe. 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 Atelectasis involving the LEFT lower lobe related to near complete obstruction of the LEFT lower lobe bronchus by fluid. Small RIGHT pleural effusion and associated passive atelectasis in the RIGHT lower lobe. Musculoskeletal: Degenerative disc disease and spondylosis involving the visualized LOWER cervical spine. Minimal degenerative disc disease and spondylosis involving the mid and LOWER thoracic spine. Stable lytic lesion in the LEFT lamina and pedicle of T11. No evidence of new osseous metastatic disease. CT  ABDOMEN PELVIS FINDINGS Hepatobiliary: Since the CT 2 months ago, marked progression metastatic disease to the liver, with innumerable masses currently. The index lesion measured previously in the MEDIAL segment LEFT lobe now measures approximately 4.8 x 3.6 cm (previously 2.4 x 2.3 cm). Mild hepatomegaly, with interval slight increase in size. Gallbladder normal in appearance without calcified gallstones. No biliary ductal dilation. Pancreas: Normal in appearance without evidence of mass, ductal dilation, or inflammation. Spleen: Normal in size and appearance. Adrenals/Urinary Tract: Normal appearing adrenal glands. Benign cyst cysts arising from the LOWER pole of the LEFT kidney posteriorly and the LATERAL aspect of the mid RIGHT kidney, unchanged. No significant abnormality involving either kidney. No urinary tract calculi. No hydronephrosis. Normal appearing mildly distended urinary bladder. Stomach/Bowel: Stomach normal in appearance for the degree of distention. Normal-appearing small bowel. Internal hernia suspected, as there are small bowel loops in the LEFT mid abdomen which are LATERAL to the descending colon. Expected stool burden in the colon which is relatively decompressed throughout and normal in appearance. Appendix surgically absent. Vascular/Lymphatic: Severe aorto-iliofemoral atherosclerosis without evidence of aneurysm. Enlarging lymph nodes in the retroperitoneum, an index LEFT periaortic node  measuring approximately 1.7 x 1.8 cm (previously 1.0 x 1.5 cm). Reproductive: Prostate gland and seminal vesicles normal in size and appearance for age. Other: None. Musculoskeletal: Mixed lytic and sclerotic metastasis in the L5 vertebral body, unchanged. No new osseous metastatic disease. Facet degenerative changes involving the LOWER lumbar spine. IMPRESSION: Since the most recent prior CT examination 11/16/2017: 1. Marked progression of metastatic lung cancer with enlarging mediastinal and LEFT hilar  lymph nodes, enlarging BILATERAL lung metastases, innumerable liver metastases, and enlarging retroperitoneal lymph nodes in the abdomen. 2. Further compression of the superior vena cava from the RIGHT UPPER paratracheal mediastinal metastatic disease. 3. Mucous plugging in the LEFT lower lobe bronchus and resulting postobstructive atelectasis in the LEFT lower lobe. 4. Small RIGHT pleural effusion and mild passive atelectasis in the RIGHT lower lobe. 5. 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 The pleural air is contained and there is no evidence of pneumothorax elsewhere. 6. Stable L5 mixed lytic and sclerotic metastasis. Stable lytic metastasis involving the lamina and pedicle of T11. No new osseous metastatic disease. 7.  Aortic Atherosclerosis (ICD10-170.0) Electronically Signed   By: Evangeline Dakin M.D.   On: 01/03/2018 14:50   Ct Abdomen Pelvis W Contrast  Result Date: 01/03/2018 CLINICAL DATA:  62 year old presenting with delirium. Current history of metastatic RIGHT upper lobe non-small cell lung cancer. Recent pericardial window 12/02/2017 due to a malignant pericardial effusion. Restaging. EXAM: CT CHEST, ABDOMEN, AND PELVIS WITH CONTRAST TECHNIQUE: Multidetector CT imaging of the chest, abdomen and pelvis was performed following the standard protocol during bolus administration of intravenous contrast. CONTRAST:  138m ISOVUE-300 IOPAMIDOL INJECTION 61% IV. Oral contrast was also administered. COMPARISON:  11/16/2017, 08/06/2017 dating back to 09/01/2016. PET-CT 05/29/2016. FINDINGS: CT CHEST FINDINGS Cardiovascular: Interval significant decrease in size of the pericardial effusion since the most recent CT 2 months ago after pericardial window. A small pericardial effusion persists. Heart size normal. Mild LAD coronary atherosclerosis. Mild atherosclerosis involving the thoracic and UPPER abdominal aorta without evidence of aneurysm. Interval development of marked narrowing of  the superior vena cava and the central portions of the RIGHT and LEFT subclavian veins due to enlarging mediastinal lymph nodes (detailed below). As a result, there are transthoracic collaterals bilaterally, with collateral veins extending into the neck. Mediastinum/Nodes: Since the CT 2 months ago, marked progression of mediastinal and LEFT hilar lymphadenopathy. Conglomerate RIGHT UPPER paratracheal nodes (station 2R) which are contiguous with tumor in the MEDIAL RIGHT upper lobe now have a maximum measurement of approximately 6.4 x 4.4 cm (previously 5.8 x 4.0 cm). Necrotic subcarinal lymph nodes now measure approximately 2.0 x 3.5 cm (previously 2.8 x 1.5 cm. Conglomerate LEFT hilar lymph nodes now measure approximately 2.3 x 3.2 cm (previously 1.7 x 2.4 cm). No pathologic axillary lymphadenopathy. Oral contrast material within the normal appearing esophagus. Normal-appearing thyroid gland. Lungs/Pleura: Interval enlargement of the previously identified pleural based nodule in the LEFT lower lobe (series 7, image 111), now measuring approximately 1.5 x 2.3 cm (previously 0.8 x 1.5 cm), now with extension into the chest wall. Stable 6 mm nodule involving the inferior RIGHT upper lobe (series 7, image 88). Stable nodule in the ANTERIOR aspect of the RIGHT lower lobe (series 7, image 126). Interval enlargement of still small satellite nodules superiorly in the RIGHT upper lobe, near the site of the original primary tumor. Post radiation fibrosis and bronchiectasis involving the RIGHT upper lobe. 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 Atelectasis involving the LEFT lower lobe related to near complete obstruction of the LEFT lower lobe bronchus by fluid. Small RIGHT pleural effusion and associated passive atelectasis in the RIGHT lower lobe. Musculoskeletal: Degenerative disc disease and spondylosis involving the visualized  LOWER cervical spine. Minimal degenerative disc disease and spondylosis involving the mid and LOWER thoracic spine. Stable lytic lesion in the LEFT lamina and pedicle of T11. No evidence of new osseous metastatic disease. CT ABDOMEN PELVIS FINDINGS Hepatobiliary: Since the CT 2 months ago, marked progression metastatic disease to the liver, with innumerable masses currently. The index lesion measured previously in the MEDIAL segment LEFT lobe now measures approximately 4.8 x 3.6 cm (previously 2.4 x 2.3 cm). Mild hepatomegaly, with interval slight increase in size. Gallbladder normal in appearance without calcified gallstones. No biliary ductal dilation. Pancreas: Normal in appearance without evidence of mass, ductal dilation, or inflammation. Spleen: Normal in size and appearance. Adrenals/Urinary Tract: Normal appearing adrenal glands. Benign cyst cysts arising from the LOWER pole of the LEFT kidney posteriorly and the LATERAL aspect of the mid RIGHT kidney, unchanged. No significant abnormality involving either kidney. No urinary tract calculi. No hydronephrosis. Normal appearing mildly distended urinary bladder. Stomach/Bowel: Stomach normal in appearance for the degree of distention. Normal-appearing small bowel. Internal hernia suspected, as there are small bowel loops in the LEFT mid abdomen which are LATERAL to the descending colon. Expected stool burden in the colon which is relatively decompressed throughout and normal in appearance. Appendix surgically absent. Vascular/Lymphatic: Severe aorto-iliofemoral atherosclerosis without evidence of aneurysm. Enlarging lymph nodes in the retroperitoneum, an index LEFT periaortic node measuring approximately 1.7 x 1.8 cm (previously 1.0 x 1.5 cm). Reproductive: Prostate gland and seminal vesicles normal in size and appearance for age. Other: None. Musculoskeletal: Mixed lytic and sclerotic metastasis in the L5 vertebral body, unchanged. No new osseous metastatic  disease. Facet degenerative changes involving the LOWER lumbar spine. IMPRESSION: Since the most recent prior CT examination 11/16/2017: 1. Marked progression of metastatic lung cancer with enlarging mediastinal and LEFT hilar lymph nodes, enlarging BILATERAL lung metastases, innumerable liver metastases, and enlarging retroperitoneal lymph nodes in the abdomen. 2. Further compression of the superior vena cava from the RIGHT UPPER paratracheal mediastinal metastatic disease. 3. Mucous plugging in the LEFT lower lobe bronchus and resulting postobstructive atelectasis in the LEFT lower lobe. 4. Small RIGHT pleural effusion and mild passive atelectasis in the RIGHT lower lobe. 5. 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 The pleural air is contained and there is no evidence of pneumothorax elsewhere. 6. Stable L5 mixed lytic and sclerotic metastasis. Stable lytic metastasis involving the lamina and pedicle of T11. No new osseous metastatic disease. 7.  Aortic Atherosclerosis (ICD10-170.0) Electronically Signed   By: Evangeline Dakin M.D.   On: 01/03/2018 14:50     ASSESSMENT/PLAN:  Malignant neoplasm of bronchus of right upper lobe [facility]) This is a very pleasant 62 year old white male with a stage IV non-small cell lung cancer, adenocarcinoma status post 6 cycles of induction systemic chemotherapy with carbo platinum, Alimta and Avastin with partial response. This was followed by maintenance treatment with Alimta and Avastin for 3 cycles and his treatment was discontinued secondary to development of cavitary pneumonia in the right upper lobe. He was on observation for several months followed by disease progression on imaging studies. The patientwas a started onsecond line treatment with Nivolumab 480 mg IV every 4 weeks status post3cyclesdiscontinued secondary to disease progression. The  patient is now on treatment with Xalkori 250 mg p.o. twice daily as the patient  hasMETamplification his molecular studies.  Status post about 6 weeks of treatment.  He is tolerating this medication fairly well with no concerning complaints. He was recently seen in the emergency room due to altered mental status and CT scans were obtained showing disease progression.  The patient was seen with Dr. Julien Nordmann.  CT scan results were discussed with the patient and his ex-wife which showed disease progression.  There is no evidence of metastatic disease to the brain.  However, there is worsening disease in his lungs, liver, and retroperitoneal lymph nodes.  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 Recommend that he discontinue treatment with Xalkori at this time. When options were discussed with the patient and his ex-wife including a referral for palliative care/hospice versus treatment with docetaxel and Cyramza.  Adverse effects of this treatment were discussed with the patient and his ex-wife including but not limited to myelosuppression, alopecia, nausea and vomiting, nail changes, peripheral neuropathy, and liver and renal dysfunction.  In addition, the side effects and Cyramza were discussed including but not limited to ulmonary hemorrhage, GI perforation, wound healing delay as well as hypertension and proteinuria.  The patient is interested in proceeding with treatment.  He was given a prescription for dexamethasone 8 mg twice daily the day before, day of, and day after each cycle of chemotherapy as well as a prescription for Compazine 10 mg every 6 hours as needed for nausea and vomiting. Anticipate first dose of chemotherapy on 01/20/2018. He will have weekly labs while on chemotherapy. Follow-up visit will be in approximately 4 weeks for evaluation prior to cycle 2 of his treatment.  For pain management he will continue on MS Contin and oxycodone.  For his decreased appetite and weight loss, I have  made a referral to the dietitian.  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  He was advised to call immediately if he has any concerning symptoms in the interval. The patient voices understanding of current disease status and treatment options and is in agreement with the current care plan. All questions were answered. The patient knows to call the clinic with any problems, questions or concerns. We can certainly see the patient much sooner if necessary.   Orders Placed This Encounter  Procedures  . CBC with Differential (Cancer Center Only)    Standing Status:   Standing    Number of Occurrences:   20    Standing Expiration Date:   01/16/2019  . CMP ([facility] only)    Standing Status:   Standing    Number of Occurrences:   20    Standing Expiration Date:   01/16/2019  . Amb Referral to Nutrition and Diabetic E    Referral Priority:   Routine    Referral Type:   Consultation    Referral Reason:   Specialty Services Required    Number of Visits Requested:   1   Mikey Bussing, DNP, AGPCNP-BC, AOCNP 01/15/18  ADDENDUM: Hematology/Oncology Attending: I had a face-to-face encounter with the patient.  I recommended his care plan.  This is a very pleasant 62 years old white male with metastatic non-small cell lung cancer, adenocarcinoma status post induction systemic chemotherapy with carboplatin, Alimta and Avastin followed by maintenance treatment with Alimta and Avastin discontinued secondary to disease progression.  This was  followed by second line treatment with immunotherapy was Nivolumab discontinued secondary to disease progression.  The patient was a started on treatment with Xalkori for the MET amplification status post 6 weeks of treatment. Unfortunately recent CT scan of the chest showed evidence for disease progression. I had a lengthy discussion with the patient and his ex-wife about his current condition and treatment options. I  gave the patient the option of palliative care and hospice referral versus consideration of second line treatment with docetaxel 75 mg/M2 and Cyramza 10 mg/KG every 3 weeks with Neulasta support.  The patient is interested in proceeding with systemic chemotherapy.  I discussed with him the adverse effect of this treatment including but not limited to alopecia, myelosuppression, nausea and vomiting, peripheral neuropathy, liver or renal dysfunction as well as the risk of bleeding including pulmonary hemorrhage. He is expected to start the first cycle of this treatment next week. We will see the patient back for follow-up visit in 4 weeks with the start of cycle #2. He was advised to call immediately if he has any concerning symptoms in the interval.  Disclaimer: This note was dictated with voice recognition software. Similar sounding words can inadvertently be transcribed and may be missed upon review. Eilleen Kempf, MD 01/15/18

## 2018-01-13 NOTE — Telephone Encounter (Signed)
Appointments scheduled per 4/17 los/ scheduled printed

## 2018-01-13 NOTE — Patient Instructions (Signed)
Docetaxel injection What is this medicine? DOCETAXEL (doe se TAX el) is a chemotherapy drug. It targets fast dividing cells, like cancer cells, and causes these cells to die. This medicine is used to treat many types of cancers like breast cancer, certain stomach cancers, head and neck cancer, lung cancer, and prostate cancer. This medicine may be used for other purposes; ask your health care provider or pharmacist if you have questions. COMMON BRAND NAME(S): Docefrez, Taxotere What should I tell my health care provider before I take this medicine? They need to know if you have any of these conditions: -infection (especially a virus infection such as chickenpox, cold sores, or herpes) -liver disease -low blood counts, like low white cell, platelet, or red cell counts -an unusual or allergic reaction to docetaxel, polysorbate 80, other chemotherapy agents, other medicines, foods, dyes, or preservatives -pregnant or trying to get pregnant -breast-feeding How should I use this medicine? This drug is given as an infusion into a vein. It is administered in a hospital or clinic by a specially trained health care professional. Talk to your pediatrician regarding the use of this medicine in children. Special care may be needed. Overdosage: If you think you have taken too much of this medicine contact a poison control center or emergency room at once. NOTE: This medicine is only for you. Do not share this medicine with others. What if I miss a dose? It is important not to miss your dose. Call your doctor or health care professional if you are unable to keep an appointment. What may interact with this medicine? -cyclosporine -erythromycin -ketoconazole -medicines to increase blood counts like filgrastim, pegfilgrastim, sargramostim -vaccines Talk to your doctor or health care professional before taking any of these medicines: -acetaminophen -aspirin -ibuprofen -ketoprofen -naproxen This list  may not describe all possible interactions. Give your health care provider a list of all the medicines, herbs, non-prescription drugs, or dietary supplements you use. Also tell them if you smoke, drink alcohol, or use illegal drugs. Some items may interact with your medicine. What should I watch for while using this medicine? Your condition will be monitored carefully while you are receiving this medicine. You will need important blood work done while you are taking this medicine. This drug may make you feel generally unwell. This is not uncommon, as chemotherapy can affect healthy cells as well as cancer cells. Report any side effects. Continue your course of treatment even though you feel ill unless your doctor tells you to stop. In some cases, you may be given additional medicines to help with side effects. Follow all directions for their use. Call your doctor or health care professional for advice if you get a fever, chills or sore throat, or other symptoms of a cold or flu. Do not treat yourself. This drug decreases your body's ability to fight infections. Try to avoid being around people who are sick. This medicine may increase your risk to bruise or bleed. Call your doctor or health care professional if you notice any unusual bleeding. This medicine may contain alcohol in the product. You may get drowsy or dizzy. Do not drive, use machinery, or do anything that needs mental alertness until you know how this medicine affects you. Do not stand or sit up quickly, especially if you are an older patient. This reduces the risk of dizzy or fainting spells. Avoid alcoholic drinks. Do not become pregnant while taking this medicine. Women should inform their doctor if they wish to become pregnant or   think they might be pregnant. There is a potential for serious side effects to an unborn child. Talk to your health care professional or pharmacist for more information. Do not breast-feed an infant while taking  this medicine. What side effects may I notice from receiving this medicine? Side effects that you should report to your doctor or health care professional as soon as possible: -allergic reactions like skin rash, itching or hives, swelling of the face, lips, or tongue -low blood counts - This drug may decrease the number of white blood cells, red blood cells and platelets. You may be at increased risk for infections and bleeding. -signs of infection - fever or chills, cough, sore throat, pain or difficulty passing urine -signs of decreased platelets or bleeding - bruising, pinpoint red spots on the skin, black, tarry stools, nosebleeds -signs of decreased red blood cells - unusually weak or tired, fainting spells, lightheadedness -breathing problems -fast or irregular heartbeat -low blood pressure -mouth sores -nausea and vomiting -pain, swelling, redness or irritation at the injection site -pain, tingling, numbness in the hands or feet -swelling of the ankle, feet, hands -weight gain Side effects that usually do not require medical attention (report to your doctor or health care professional if they continue or are bothersome): -bone pain -complete hair loss including hair on your head, underarms, pubic hair, eyebrows, and eyelashes -diarrhea -excessive tearing -changes in the color of fingernails -loosening of the fingernails -nausea -muscle pain -red flush to skin -sweating -weak or tired This list may not describe all possible side effects. Call your doctor for medical advice about side effects. You may report side effects to FDA at 1-800-FDA-1088. Where should I keep my medicine? This drug is given in a hospital or clinic and will not be stored at home. NOTE: This sheet is a summary. It may not cover all possible information. If you have questions about this medicine, talk to your doctor, pharmacist, or health care provider.  2018 Elsevier/Gold Standard (2015-10-18  12:32:56)  Ramucirumab injection What is this medicine? RAMUCIRUMAB (ra mue SIR ue mab) is a monoclonal antibody. It is used to treat stomach cancer, colorectal cancer, or lung cancer. This medicine may be used for other purposes; ask your health care provider or pharmacist if you have questions. COMMON BRAND NAME(S): Cyramza What should I tell my health care provider before I take this medicine? They need to know if you have any of these conditions: -bleeding disorders -blood clots -heart disease, including heart failure, heart attack, or chest pain (angina) -high blood pressure -infection (especially a virus infection such as chickenpox, cold sores, or herpes) -protein in your urine -recent surgery -stroke -an unusual or allergic reaction to ramucirumab, other medicines, foods, dyes, or preservatives -pregnant or trying to get pregnant -breast-feeding How should I use this medicine? This medicine is for infusion into a vein. It is given by a health care professional in a hospital or clinic setting. Talk to your pediatrician regarding the use of this medicine in children. Special care may be needed. Overdosage: If you think you have taken too much of this medicine contact a poison control center or emergency room at once. NOTE: This medicine is only for you. Do not share this medicine with others. What if I miss a dose? It is important not to miss your dose. Call your doctor or health care professional if you are unable to keep an appointment. What may interact with this medicine? Interactions have not been studied. This list may  not describe all possible interactions. Give your health care provider a list of all the medicines, herbs, non-prescription drugs, or dietary supplements you use. Also tell them if you smoke, drink alcohol, or use illegal drugs. Some items may interact with your medicine. What should I watch for while using this medicine? Your condition will be monitored  carefully while you are receiving this medicine. You will need to to check your blood pressure and have your blood and urine tested while you are taking this medicine. Your condition will be monitored carefully while you are receiving this medicine. This medicine may increase your risk to bruise or bleed. Call your doctor or health care professional if you notice any unusual bleeding. This medicine may rarely cause 'gastrointestinal perforation' (holes in the stomach, intestines or colon), a serious side effect requiring surgery to repair. This medicine should be started at least 28 days following major surgery and the site of the surgery should be totally healed. Check with your doctor before scheduling dental work or surgery while you are receiving this treatment. Talk to your doctor if you have recently had surgery or if you have a wound that has not healed. Do not become pregnant while taking this medicine or for 3 months after stopping it. Women should inform their doctor if they wish to become pregnant or think they might be pregnant. There is a potential for serious side effects to an unborn child. Talk to your health care professional or pharmacist for more information. What side effects may I notice from receiving this medicine? Side effects that you should report to your doctor or health care professional as soon as possible: -allergic reactions like skin rash, itching or hives, breathing problems, swelling of the face, lips, or tongue -signs of infection - fever or chills, cough, sore throat -chest pain or chest tightness -confusion -dizziness -feeling faint or lightheaded, falls -severe abdominal pain -severe nausea, vomiting -signs and symptoms of bleeding such as bloody or black, tarry stools; red or dark-brown urine; spitting up blood or brown material that looks like coffee grounds; red spots on the skin; unusual bruising or bleeding from the eye, gums, or nose -signs and symptoms of  a blood clot such as breathing problems; changes in vision; chest pain; severe, sudden headache; pain, swelling, warmth in the leg; trouble speaking; sudden numbness or weakness of the face, arm or leg -symptoms of a stroke: change in mental awareness, inability to talk or move one side of the body -trouble walking, dizziness, loss of balance or coordination Side effects that usually do not require medical attention (report to your doctor or health care professional if they continue or are bothersome): -cold, clammy skin -constipation -diarrhea -headache -nausea, vomiting -stomach pain -unusually slow heartbeat -unusually weak or tired This list may not describe all possible side effects. Call your doctor for medical advice about side effects. You may report side effects to FDA at 1-800-FDA-1088. Where should I keep my medicine? This drug is given in a hospital or clinic and will not be stored at home. NOTE: This sheet is a summary. It may not cover all possible information. If you have questions about this medicine, talk to your doctor, pharmacist, or health care provider.  2018 Elsevier/Gold Standard (2015-10-18 08:20:29)

## 2018-01-13 NOTE — Progress Notes (Signed)
DISCONTINUE ON PATHWAY REGIMEN - Non-Small Cell Lung     A cycle is every 28 days:     Nivolumab   **Always confirm dose/schedule in your pharmacy ordering system**    REASON: Disease Progression PRIOR TREATMENT: [id number]: Nivolumab 480 mg q28 Days Until Progression or Unacceptable Toxicity TREATMENT RESPONSE: Progressive Disease (PD)  START ON PATHWAY REGIMEN - Non-Small Cell Lung     A cycle is every 21 days:     Ramucirumab      Docetaxel   **Always confirm dose/schedule in your pharmacy ordering system**    Patient Characteristics: Stage IV Metastatic, Nonsquamous, Third Line - Chemotherapy/Immunotherapy, PS = 0, 1, Prior PD-1/PD-L1 Inhibitor or No Prior PD-1/PD-L1 Inhibitor and Not a Candidate for Immunotherapy AJCC T Category: T3 Current Disease Status: Distant Metastases AJCC N Category: N3 AJCC M Category: M1c AJCC 8 Stage Grouping: IVB Histology: Nonsquamous Cell ROS1 Rearrangement Status: Negative T790M Mutation Status: Not Applicable - EGFR Mutation Negative/Unknown Other Mutations/Biomarkers: No Other Actionable Mutations PD-L1 Expression Status: Quantity Not Sufficient Chemotherapy/Immunotherapy LOT: Third Line Chemotherapy/Immunotherapy Molecular Targeted Therapy: Not Appropriate ALK Translocation Status: Negative EGFR Mutation Status: Negative/Wild Type BRAF V600E Mutation Status: Negative Performance Status: PS = 0, 1 Immunotherapy Candidate Status: Not a Candidate for Immunotherapy Prior Immunotherapy Status: Prior PD-1/PD-L1 Inhibitor Intent of Therapy: Non-Curative / Palliative Intent, Discussed with Patient

## 2018-01-15 ENCOUNTER — Encounter: Payer: Self-pay | Admitting: Oncology

## 2018-01-15 DIAGNOSIS — E46 Unspecified protein-calorie malnutrition: Secondary | ICD-10-CM | POA: Insufficient documentation

## 2018-01-15 NOTE — Assessment & Plan Note (Signed)
This is a very pleasant 62 year old white male with a stage IV non-small cell lung cancer, adenocarcinoma status post 6 cycles of induction systemic chemotherapy with carbo platinum, Alimta and Avastin with partial response. This was followed by maintenance treatment with Alimta and Avastin for 3 cycles and his treatment was discontinued secondary to development of cavitary pneumonia in the right upper lobe. He was on observation for several months followed by disease progression on imaging studies. The patientwas a started onsecond line treatment with Nivolumab 480 mg IV every 4 weeks status post3cyclesdiscontinued secondary to disease progression. The patient is now on treatment with Xalkori 250 mg p.o. twice daily as the patient hasMETamplification his molecular studies.  Status post about 6 weeks of treatment.  He is tolerating this medication fairly well with no concerning complaints. He was recently seen in the emergency room due to altered mental status and CT scans were obtained showing disease progression.  The patient was seen with Dr. Julien Nordmann.  CT scan results were discussed with the patient and his ex-wife which showed disease progression.  There is no evidence of metastatic disease to the brain.  However, there is worsening disease in his lungs, liver, and retroperitoneal lymph nodes.  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 Recommend that he discontinue treatment with Xalkori at this time. When options were discussed with the patient and his ex-wife including a referral for palliative care/hospice versus treatment with docetaxel and Cyramza.  Adverse effects of this treatment were discussed with the patient and his ex-wife including but not limited to myelosuppression, alopecia, nausea and vomiting, nail changes, peripheral neuropathy, and liver and renal dysfunction.  In addition, the side effects and  Cyramza were discussed including but not limited to ulmonary hemorrhage, GI perforation, wound healing delay as well as hypertension and proteinuria.  The patient is interested in proceeding with treatment.  He was given a prescription for dexamethasone 8 mg twice daily the day before, day of, and day after each cycle of chemotherapy as well as a prescription for Compazine 10 mg every 6 hours as needed for nausea and vomiting. Anticipate first dose of chemotherapy on 01/20/2018. He will have weekly labs while on chemotherapy. Follow-up visit will be in approximately 4 weeks for evaluation prior to cycle 2 of his treatment.  For pain management he will continue on MS Contin and oxycodone.  For his decreased appetite and weight loss, I have made a referral to the dietitian.  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  He was advised to call immediately if he has any concerning symptoms in the interval. The patient voices understanding of current disease status and treatment options and is in agreement with the current care plan. All questions were answered. The patient knows to call the clinic with any problems, questions or concerns. We can certainly see the patient much sooner if necessary.

## 2018-01-21 ENCOUNTER — Inpatient Hospital Stay: Payer: Medicaid Other | Admitting: Nutrition

## 2018-01-21 ENCOUNTER — Inpatient Hospital Stay: Payer: Medicaid Other

## 2018-01-21 DIAGNOSIS — C3411 Malignant neoplasm of upper lobe, right bronchus or lung: Secondary | ICD-10-CM

## 2018-01-21 LAB — CMP (CANCER CENTER ONLY)
ALT: 17 U/L (ref 0–55)
AST: 89 U/L — ABNORMAL HIGH (ref 5–34)
Albumin: 3.3 g/dL — ABNORMAL LOW (ref 3.5–5.0)
Alkaline Phosphatase: 186 U/L — ABNORMAL HIGH (ref 40–150)
Anion gap: 8 (ref 3–11)
BUN: 11 mg/dL (ref 7–26)
CO2: 23 mmol/L (ref 22–29)
Calcium: 10.4 mg/dL (ref 8.4–10.4)
Chloride: 104 mmol/L (ref 98–109)
Creatinine: 0.67 mg/dL — ABNORMAL LOW (ref 0.70–1.30)
GFR, Est AFR Am: >60 mL/min (ref >60)
GFR, Estimated: >60 mL/min (ref >60)
Glucose, Bld: 131 mg/dL (ref 70–140)
Potassium: 4.3 mmol/L (ref 3.5–5.1)
Sodium: 135 mmol/L — ABNORMAL LOW (ref 136–145)
Total Bilirubin: 0.5 mg/dL (ref 0.2–1.2)
Total Protein: 7.6 g/dL (ref 6.4–8.3)

## 2018-01-21 LAB — CBC WITH DIFFERENTIAL (CANCER CENTER ONLY)
Basophils Absolute: 0 10*3/uL (ref 0.0–0.1)
Basophils Relative: 0 %
EOS ABS: 0 10*3/uL (ref 0.0–0.5)
Eosinophils Relative: 0 %
HCT: 38.3 % — ABNORMAL LOW (ref 38.4–49.9)
HEMOGLOBIN: 11.9 g/dL — AB (ref 13.0–17.1)
LYMPHS ABS: 0.3 10*3/uL — AB (ref 0.9–3.3)
LYMPHS PCT: 4 %
MCH: 25.5 pg — AB (ref 27.2–33.4)
MCHC: 31.1 g/dL — AB (ref 32.0–36.0)
MCV: 82.2 fL (ref 79.3–98.0)
MONOS PCT: 2 %
Monocytes Absolute: 0.2 10*3/uL (ref 0.1–0.9)
NEUTROS ABS: 6.8 10*3/uL — AB (ref 1.5–6.5)
NEUTROS PCT: 94 %
Platelet Count: 242 10*3/uL (ref 140–400)
RBC: 4.66 MIL/uL (ref 4.20–5.82)
RDW: 17.8 % — ABNORMAL HIGH (ref 11.0–14.6)
WBC: 7.3 10*3/uL (ref 4.0–10.3)

## 2018-01-21 MED ORDER — SODIUM CHLORIDE 0.9 % IV SOLN
1000.0000 mL | Freq: Once | INTRAVENOUS | Status: AC
  Administered 2018-01-21: 1000 mL via INTRAVENOUS

## 2018-01-21 NOTE — Progress Notes (Signed)
Nutrition follow-up completed with patient receiving fluids for non-small cell lung cancer. Weight decreased and documented as 148.8 pounds April 17. Patient is struggling with nausea and vomiting. He denies constipation He still dislikes high-calorie, high protein supplements. Patient states he likes Carnation breakfast.  Nutrition diagnosis: Severe malnutrition continues.  Intervention: I recommended patient consume Carnation breakfast essentials made with fortified milk to provide 350 cal and 20 g of protein per 8 ounces. Suggested patient drink at least 3 of these daily. Encourage patient to take nausea medication as needed. Provided recipes, samples,and fact sheets. Questions were answered.  Teach back method used.  Monitoring, evaluation, goals: Patient will tolerate increased calories and protein for improved quality of life.  Next visit: Wednesday, May 15 during infusion.  **Disclaimer: This note was dictated with voice recognition software. Similar sounding words can inadvertently be transcribed and this note may contain transcription errors which may not have been corrected upon publication of note.**

## 2018-01-21 NOTE — Patient Instructions (Signed)
Dehydration, Adult Dehydration is a condition in which there is not enough fluid or water in the body. This happens when you lose more fluids than you take in. Important organs, such as the kidneys, brain, and heart, cannot function without a proper amount of fluids. Any loss of fluids from the body can lead to dehydration. Dehydration can range from mild to severe. This condition should be treated right away to prevent it from becoming severe. What are the causes? This condition may be caused by:  Vomiting.  Diarrhea.  Excessive sweating, such as from heat exposure or exercise.  Not drinking enough fluid, especially: ? When ill. ? While doing activity that requires a lot of energy.  Excessive urination.  Fever.  Infection.  Certain medicines, such as medicines that cause the body to lose excess fluid (diuretics).  Inability to access safe drinking water.  Reduced physical ability to get adequate water and food.  What increases the risk? This condition is more likely to develop in people:  Who have a poorly controlled long-term (chronic) illness, such as diabetes, heart disease, or kidney disease.  Who are age 65 or older.  Who are disabled.  Who live in a place with high altitude.  Who play endurance sports.  What are the signs or symptoms? Symptoms of mild dehydration may include:  Thirst.  Dry lips.  Slightly dry mouth.  Dry, warm skin.  Dizziness. Symptoms of moderate dehydration may include:  Very dry mouth.  Muscle cramps.  Dark urine. Urine may be the color of tea.  Decreased urine production.  Decreased tear production.  Heartbeat that is irregular or faster than normal (palpitations).  Headache.  Light-headedness, especially when you stand up from a sitting position.  Fainting (syncope). Symptoms of severe dehydration may include:  Changes in skin, such as: ? Cold and clammy skin. ? Blotchy (mottled) or pale skin. ? Skin that does  not quickly return to normal after being lightly pinched and released (poor skin turgor).  Changes in body fluids, such as: ? Extreme thirst. ? No tear production. ? Inability to sweat when body temperature is high, such as in hot weather. ? Very little urine production.  Changes in vital signs, such as: ? Weak pulse. ? Pulse that is more than 100 beats a minute when sitting still. ? Rapid breathing. ? Low blood pressure.  Other changes, such as: ? Sunken eyes. ? Cold hands and feet. ? Confusion. ? Lack of energy (lethargy). ? Difficulty waking up from sleep. ? Short-term weight loss. ? Unconsciousness. How is this diagnosed? This condition is diagnosed based on your symptoms and a physical exam. Blood and urine tests may be done to help confirm the diagnosis. How is this treated? Treatment for this condition depends on the severity. Mild or moderate dehydration can often be treated at home. Treatment should be started right away. Do not wait until dehydration becomes severe. Severe dehydration is an emergency and it needs to be treated in a hospital. Treatment for mild dehydration may include:  Drinking more fluids.  Replacing salts and minerals in your blood (electrolytes) that you may have lost. Treatment for moderate dehydration may include:  Drinking an oral rehydration solution (ORS). This is a drink that helps you replace fluids and electrolytes (rehydrate). It can be found at pharmacies and retail stores. Treatment for severe dehydration may include:  Receiving fluids through an IV tube.  Receiving an electrolyte solution through a feeding tube that is passed through your nose   and into your stomach (nasogastric tube, or NG tube).  Correcting any abnormalities in electrolytes.  Treating the underlying cause of dehydration. Follow these instructions at home:  If directed by your health care provider, drink an ORS: ? Make an ORS by following instructions on the  package. ? Start by drinking small amounts, about  cup (120 mL) every 5-10 minutes. ? Slowly increase how much you drink until you have taken the amount recommended by your health care provider.  Drink enough clear fluid to keep your urine clear or pale yellow. If you were told to drink an ORS, finish the ORS first, then start slowly drinking other clear fluids. Drink fluids such as: ? Water. Do not drink only water. Doing that can lead to having too little salt (sodium) in the body (hyponatremia). ? Ice chips. ? Fruit juice that you have added water to (diluted fruit juice). ? Low-calorie sports drinks.  Avoid: ? Alcohol. ? Drinks that contain a lot of sugar. These include high-calorie sports drinks, fruit juice that is not diluted, and soda. ? Caffeine. ? Foods that are greasy or contain a lot of fat or sugar.  Take over-the-counter and prescription medicines only as told by your health care provider.  Do not take sodium tablets. This can lead to having too much sodium in the body (hypernatremia).  Eat foods that contain a healthy balance of electrolytes, such as bananas, oranges, potatoes, tomatoes, and spinach.  Keep all follow-up visits as told by your health care provider. This is important. Contact a health care provider if:  You have abdominal pain that: ? Gets worse. ? Stays in one area (localizes).  You have a rash.  You have a stiff neck.  You are more irritable than usual.  You are sleepier or more difficult to wake up than usual.  You feel weak or dizzy.  You feel very thirsty.  You have urinated only a small amount of very dark urine over 6-8 hours. Get help right away if:  You have symptoms of severe dehydration.  You cannot drink fluids without vomiting.  Your symptoms get worse with treatment.  You have a fever.  You have a severe headache.  You have vomiting or diarrhea that: ? Gets worse. ? Does not go away.  You have blood or green matter  (bile) in your vomit.  You have blood in your stool. This may cause stool to look black and tarry.  You have not urinated in 6-8 hours.  You faint.  Your heart rate while sitting still is over 100 beats a minute.  You have trouble breathing. This information is not intended to replace advice given to you by your health care provider. Make sure you discuss any questions you have with your health care provider. Document Released: 09/15/2005 Document Revised: 04/11/2016 Document Reviewed: 11/09/2015 Elsevier Interactive Patient Education  2018 [employer]  

## 2018-01-21 NOTE — Progress Notes (Signed)
Pt has concerns regarding treatment going forward. He states " I dont think I can handle it, I feel like it might kill me."  Had long discussion with pt regarding his choice to proceed with treatment or choosing to go palliative or even hospice at this time.  Pt advised his pain medication is not working, he is in constant pain, can't sleep, unable to eat (lack of appetite/pain).   Discussed with pt I am unable to make changes to pain meds at this time as MD out of office.  Pt unable to walk without assistance due to balance issues and weakness.  Patient has requested to defer treatment until he thinks it over a little more.  Encouraged pt to go home and think about his decisions and call office to let MD know what he has decided to do.  Talked to pt about Hospice and quality of life. Offered to make referral to Hospice  Pt very appreciative of the conversation regarding treatment and Hospice. He advised he will think it over and call office with his decision.   Reviewed with Erasmo Downer, NP as MD out of office. Message forward ot MD for review.

## 2018-01-23 ENCOUNTER — Inpatient Hospital Stay: Payer: Medicaid Other

## 2018-01-25 ENCOUNTER — Telehealth: Payer: Self-pay | Admitting: *Deleted

## 2018-01-25 DIAGNOSIS — C3411 Malignant neoplasm of upper lobe, right bronchus or lung: Secondary | ICD-10-CM

## 2018-01-25 NOTE — Telephone Encounter (Signed)
Pt wife Edward Spence called states "[facility] has decided to have Hospice."  Pt is continuously in pain and feels the treatments will not help his condition improve. Ambulatory referral to Hospice placed. Hospice called, request for Edward Spence to be point of Contact.  No further concerns.

## 2018-01-25 NOTE — Telephone Encounter (Signed)
2. Call received from patient's wife requesting to speak with Dr. Worthy Flank nurse.  Call transferred to collaborative as requested.  1. Voicemail received requesting "Return call for new pain medication.  Taking pain medications around the clock as ordered, not working, in a tremendous amount of pain, needs a new or different medcine.  He also would like Hospice to help with pain."  Message forwarded to collaborative for provider review and further patient communication.

## 2018-01-27 ENCOUNTER — Inpatient Hospital Stay: Payer: Medicaid Other

## 2018-01-27 ENCOUNTER — Other Ambulatory Visit: Payer: Self-pay | Admitting: Medical Oncology

## 2018-01-27 ENCOUNTER — Telehealth: Payer: Self-pay | Admitting: Medical Oncology

## 2018-01-27 DIAGNOSIS — C3411 Malignant neoplasm of upper lobe, right bronchus or lung: Secondary | ICD-10-CM

## 2018-01-27 MED ORDER — MORPHINE SULFATE ER 60 MG PO TBCR: 120.0000 mg | EXTENDED_RELEASE_TABLET | Freq: Two times a day (BID) | ORAL | 0 refills | Status: AC

## 2018-01-27 MED ORDER — MORPHINE SULFATE ER 60 MG PO TBCR: 60.0000 mg | EXTENDED_RELEASE_TABLET | Freq: Two times a day (BID) | ORAL | 0 refills | Status: DC

## 2018-01-27 NOTE — Telephone Encounter (Addendum)
Ok to increase MsContin to 120 mg bid. Would recommend hospice director to take care of symptom management. Consider cancelling all appointment at the cancer center and see him as needed.Faxed MS contin .

## 2018-01-27 NOTE — Telephone Encounter (Signed)
Nurse says pt described that his pain is 9/10 and "it feels like my testicles are being pulled off".

## 2018-01-27 NOTE — Telephone Encounter (Signed)
Increased pain Hospice nurse requests to consider increasing one or both pain meds. Pt taking 6 oxycodone /day pain med.

## 2018-01-28 ENCOUNTER — Encounter: Payer: Self-pay | Admitting: Thoracic Surgery (Cardiothoracic Vascular Surgery)

## 2018-01-28 LAB — PROTIME-INR

## 2018-02-01 ENCOUNTER — Ambulatory Visit (HOSPITAL_COMMUNITY): Payer: Medicaid Other

## 2018-02-01 ENCOUNTER — Other Ambulatory Visit: Payer: Medicaid Other

## 2018-02-02 ENCOUNTER — Telehealth: Payer: Self-pay | Admitting: *Deleted

## 2018-02-02 NOTE — Telephone Encounter (Signed)
Call to [facility], lm with Receptionist for Amy, RN Requesting Director of Hospice take over symptom management for pt. Pain meds will not be refilled at this time. Requested call back to confirm message was received.

## 2018-02-03 ENCOUNTER — Other Ambulatory Visit: Payer: Medicaid Other

## 2018-02-03 ENCOUNTER — Ambulatory Visit: Payer: Medicaid Other | Admitting: Internal Medicine

## 2018-02-04 IMAGING — DX DG CHEST 2V
2 series · 2 of 2 positions shown · non-contrast
Comparison: No recent.

CLINICAL DATA: Cough.

EXAM:
CHEST  2 VIEW

[chest pa]
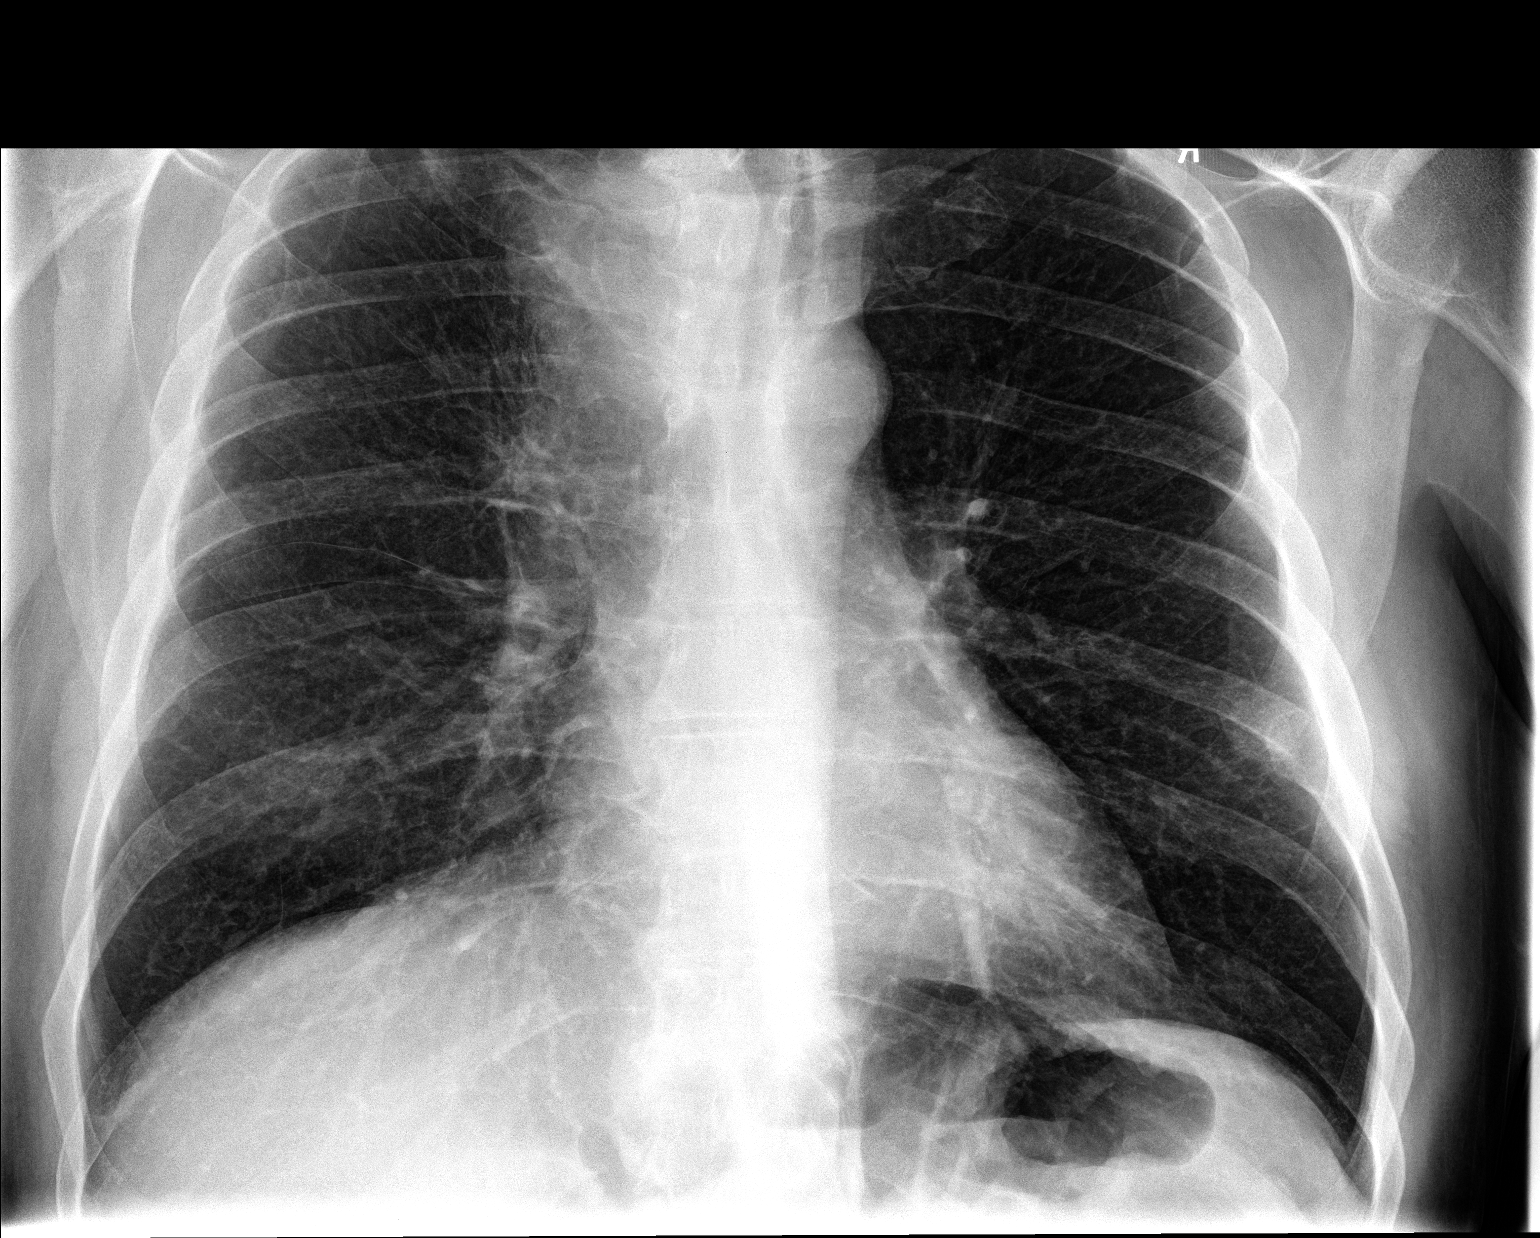

[chest lat]
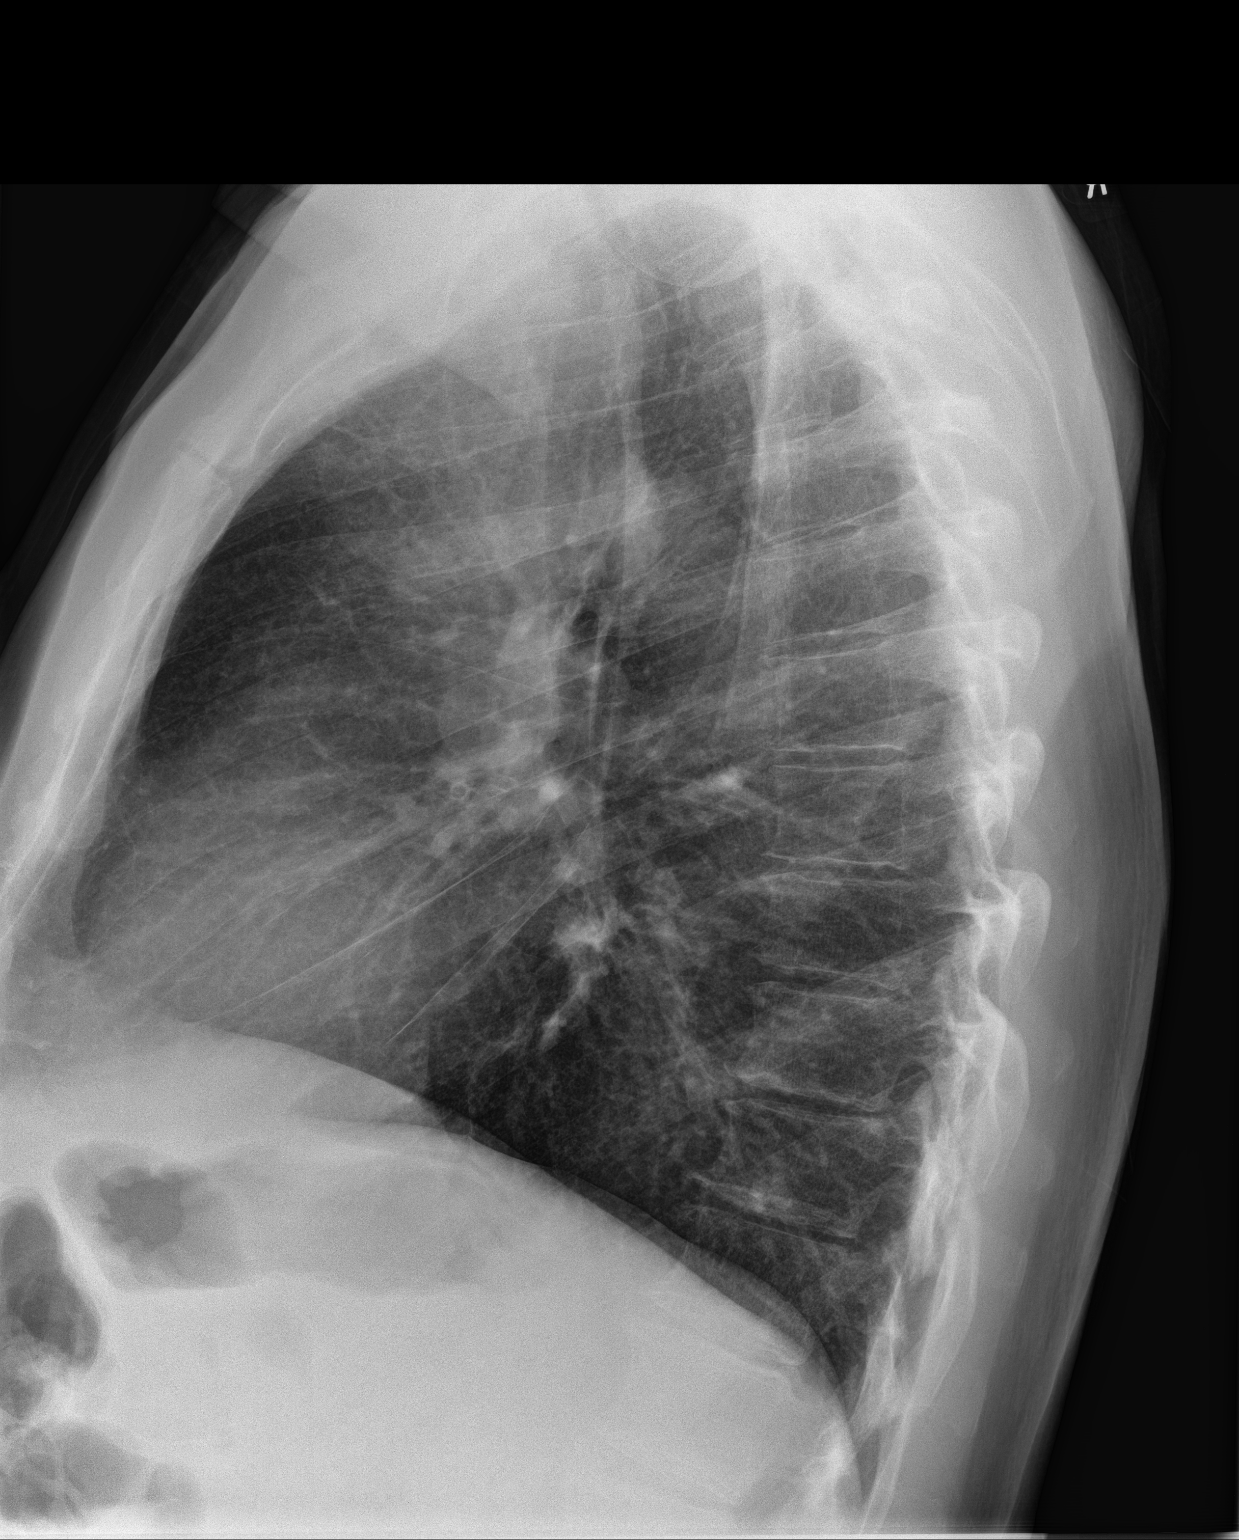

[2 of 2 positions shown; findings below may reference images not displayed]

FINDINGS: Right paratracheal mass is noted. This could be of pulmonary,
pleural, vascular/ mediastinal origin. Mass results in shift of the
trachea to the left. Contrast-enhanced chest CT suggested for
further evaluation. Mild bilateral from interstitial prominence,
most likely related chronic interstitial lung disease. No focal
alveolar infiltrate. No pleural effusion or pneumothorax. Old left
posterior eighth rib fracture.
IMPRESSION: Prominent right paratracheal mass with shift of the trachea to the
left. Contrast-enhanced chest CT suggested for further evaluation.
These results will be called to the ordering clinician or
representative by the Radiologist Assistant, and communication
documented in the PACS or zVision Dashboard.

## 2018-02-09 ENCOUNTER — Telehealth: Payer: Self-pay | Admitting: Medical Oncology

## 2018-02-09 NOTE — Telephone Encounter (Signed)
Pt died at home.

## 2018-02-10 ENCOUNTER — Other Ambulatory Visit: Payer: Medicaid Other

## 2018-02-10 ENCOUNTER — Ambulatory Visit: Payer: Medicaid Other

## 2018-02-10 ENCOUNTER — Encounter: Payer: Medicaid Other | Admitting: Nutrition

## 2018-02-10 ENCOUNTER — Ambulatory Visit: Payer: Medicaid Other | Admitting: Internal Medicine

## 2018-02-12 ENCOUNTER — Ambulatory Visit: Payer: Medicaid Other

## 2018-02-17 ENCOUNTER — Other Ambulatory Visit: Payer: Medicaid Other

## 2018-02-24 ENCOUNTER — Other Ambulatory Visit: Payer: Medicaid Other

## 2018-02-27 DEATH — deceased

## 2018-03-03 ENCOUNTER — Other Ambulatory Visit: Payer: Medicaid Other

## 2018-03-03 ENCOUNTER — Ambulatory Visit: Payer: Medicaid Other

## 2018-03-03 ENCOUNTER — Ambulatory Visit: Payer: Medicaid Other | Admitting: Oncology

## 2018-03-05 ENCOUNTER — Ambulatory Visit: Payer: Medicaid Other

## 2018-03-10 ENCOUNTER — Other Ambulatory Visit: Payer: Medicaid Other

## 2018-03-17 ENCOUNTER — Other Ambulatory Visit: Payer: Medicaid Other

## 2018-03-20 ENCOUNTER — Other Ambulatory Visit: Payer: Self-pay | Admitting: Pulmonary Disease

## 2018-03-24 ENCOUNTER — Ambulatory Visit: Payer: Medicaid Other

## 2018-03-24 ENCOUNTER — Ambulatory Visit: Payer: Medicaid Other | Admitting: Oncology

## 2018-03-24 ENCOUNTER — Other Ambulatory Visit: Payer: Medicaid Other

## 2018-03-26 ENCOUNTER — Ambulatory Visit: Payer: Medicaid Other

## 2018-11-11 IMAGING — DX DG CHEST 2V
2 series · 2 of 2 positions shown · non-contrast
Comparison: CT 01/19/2017.

CLINICAL DATA: Cough and congestion.

EXAM:
CHEST  2 VIEW

[chest pa]
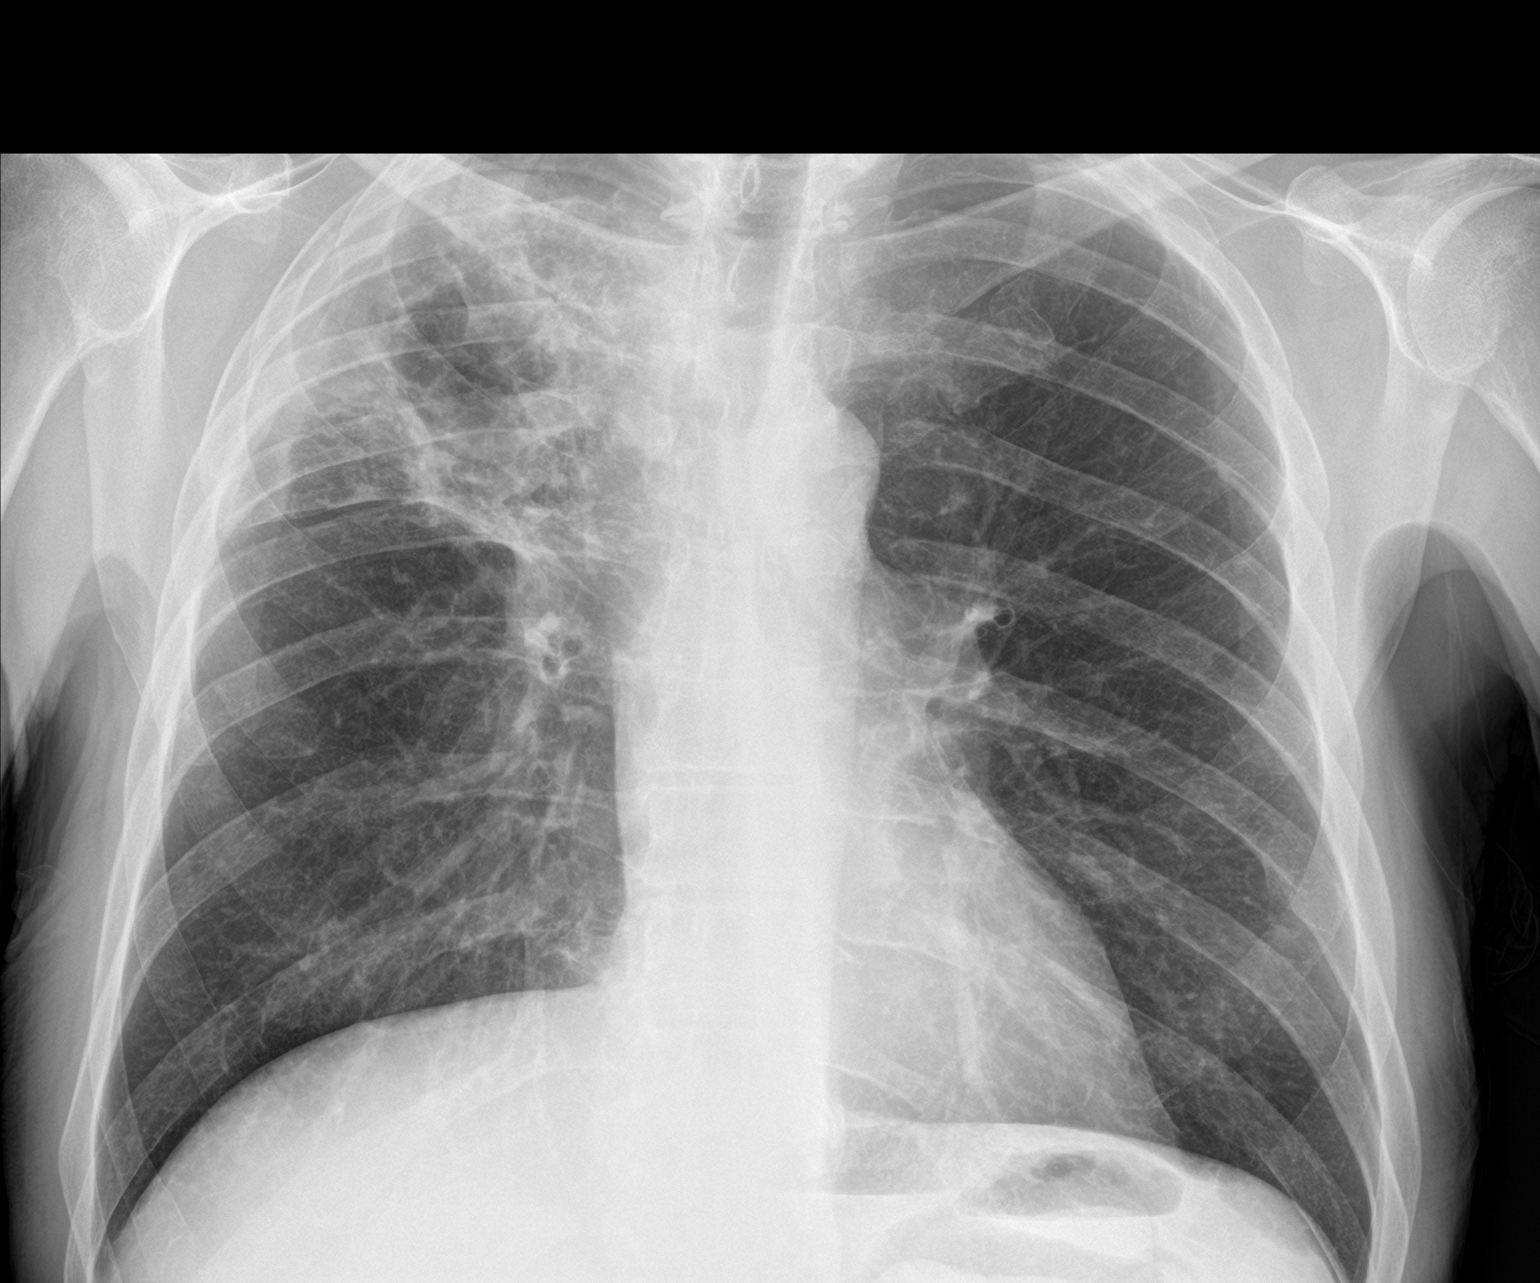

[chest lat]
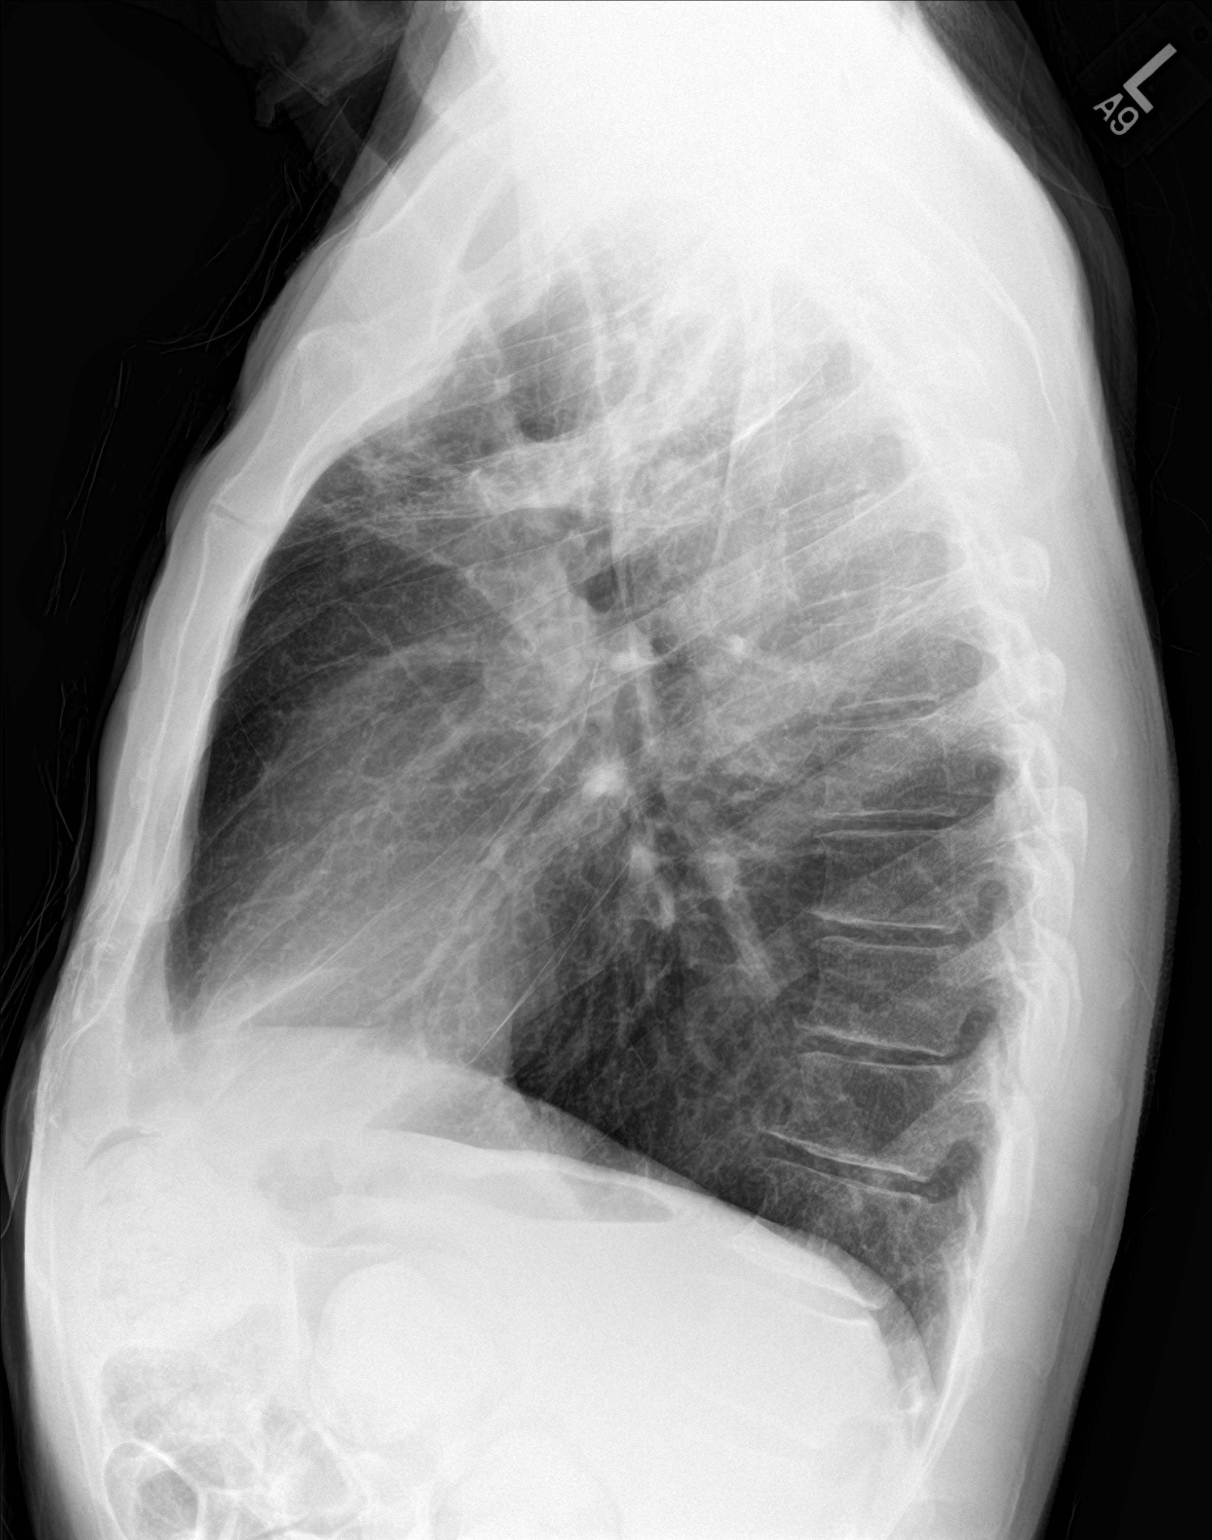

[2 of 2 positions shown; findings below may reference images not displayed]

FINDINGS: Mediastinum and hilar structures stable. Heart size stable.
Prominent cavity with adjacent infiltrate right upper lobe is again
noted. Similar findings noted on prior CT. No pleural effusion or
pneumothorax. No acute bony abnormality.
IMPRESSION: Persistent prominent cavity with adjacent infiltrate right upper
lobe again noted. This could be infectious and/or malignant. Similar
findings noted on prior CT of 01/19/2017.

## 2018-11-26 IMAGING — DX DG CHEST 1V PORT
1 series · 1 of 1 positions shown · non-contrast
Comparison: February 12, 2017

CLINICAL DATA: Status post right upper lobe bronchoscopy

EXAM:
PORTABLE CHEST 1 VIEW

[chest ap]
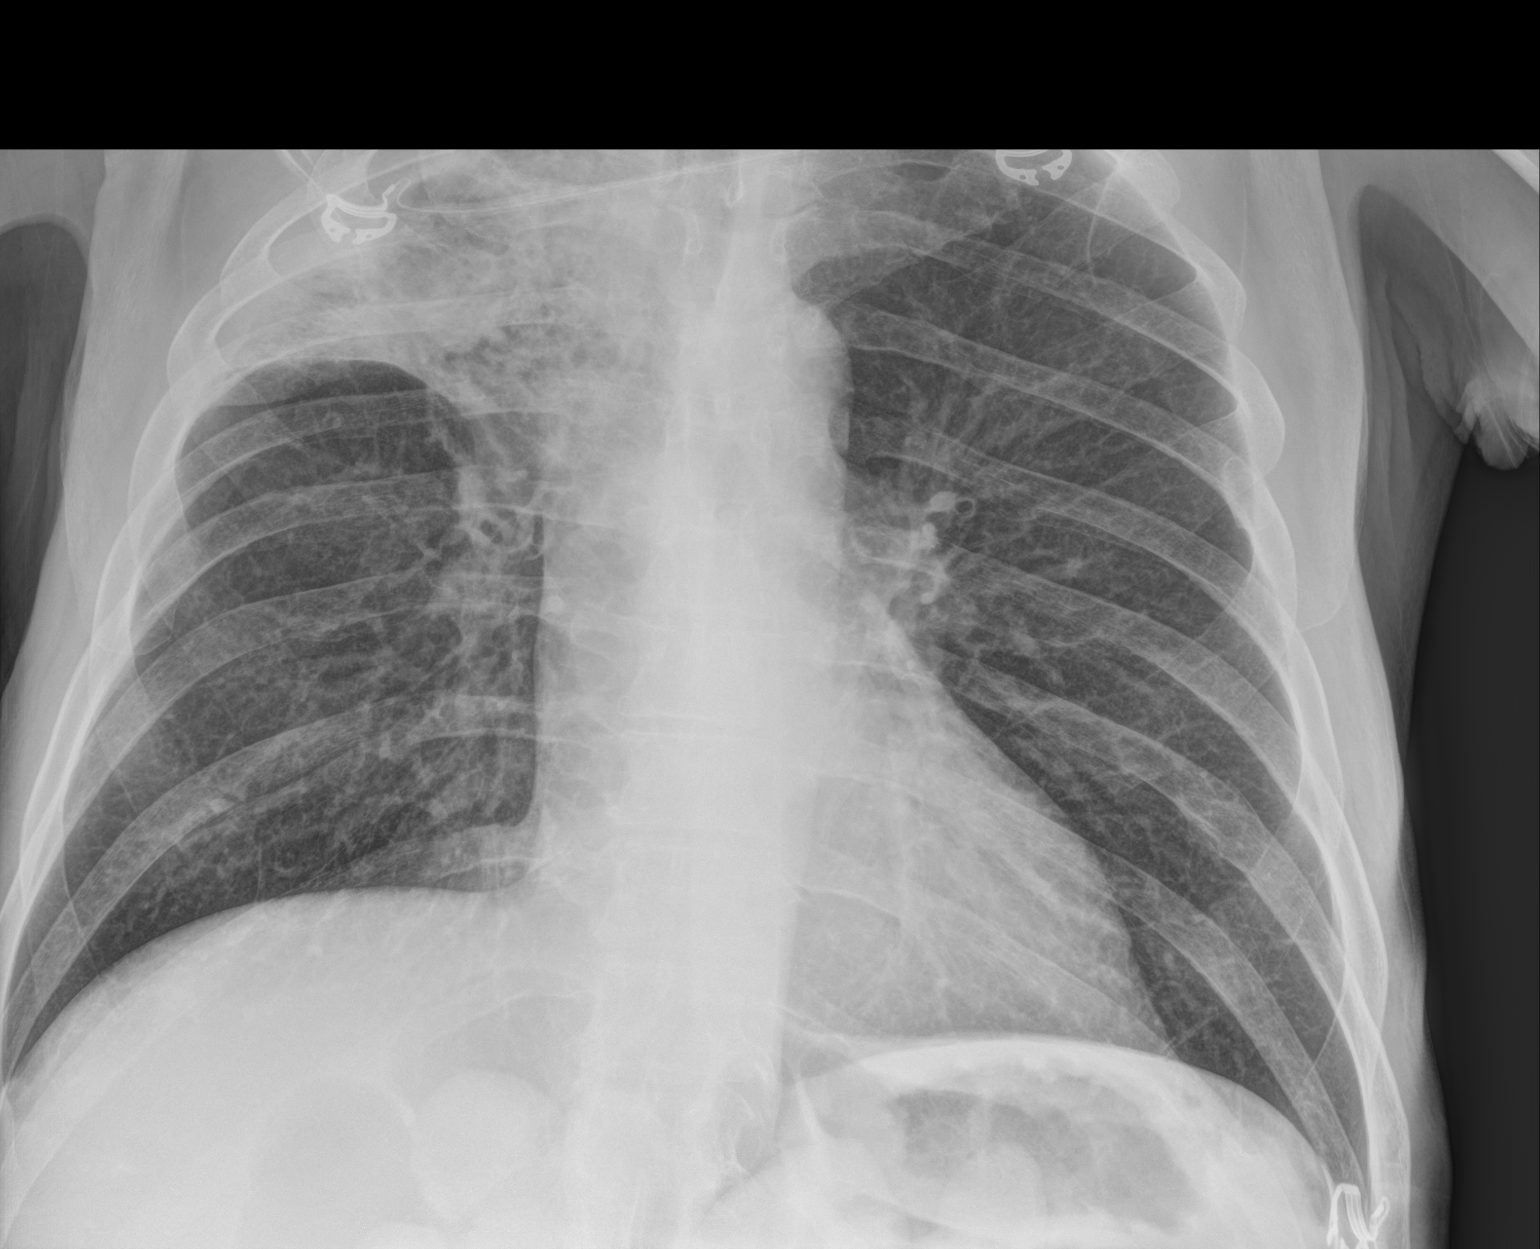

[1 of 1 positions shown; findings below may reference images not displayed]

FINDINGS: No pneumothorax. There is increase consolidation in the right upper
lobe with volume loss in the right upper lobe compared to recent
study. Lungs elsewhere are clear. Heart size and pulmonary
vascularity are normal. No adenopathy. There is an old healed
fracture of the lateral left fifth rib, stable.
IMPRESSION: No pneumothorax. Increase in consolidation with volume loss right
upper lobe compared to most recent study. Lungs elsewhere clear.
Stable cardiac silhouette.

## 2021-12-29 ENCOUNTER — Encounter: Payer: Self-pay | Admitting: Internal Medicine

## 2022-03-01 ENCOUNTER — Other Ambulatory Visit: Payer: Self-pay | Admitting: Nurse Practitioner
